# Patient Record
Sex: Female | Born: 1945
Health system: Southern US, Community
[De-identification: ages and names within clinical notes are randomized; demographics above are authoritative.]

## PROBLEM LIST (undated history)

## (undated) DIAGNOSIS — B019 Varicella without complication: Secondary | ICD-10-CM

## (undated) DIAGNOSIS — K5790 Diverticulosis of intestine, part unspecified, without perforation or abscess without bleeding: Secondary | ICD-10-CM

## (undated) DIAGNOSIS — D126 Benign neoplasm of colon, unspecified: Secondary | ICD-10-CM

## (undated) DIAGNOSIS — R519 Headache, unspecified: Secondary | ICD-10-CM

## (undated) DIAGNOSIS — R51 Headache: Secondary | ICD-10-CM

## (undated) DIAGNOSIS — Z9889 Other specified postprocedural states: Secondary | ICD-10-CM

## (undated) DIAGNOSIS — R112 Nausea with vomiting, unspecified: Secondary | ICD-10-CM

## (undated) DIAGNOSIS — K4190 Unilateral femoral hernia, without obstruction or gangrene, not specified as recurrent: Secondary | ICD-10-CM

## (undated) DIAGNOSIS — J302 Other seasonal allergic rhinitis: Secondary | ICD-10-CM

## (undated) DIAGNOSIS — M1990 Unspecified osteoarthritis, unspecified site: Secondary | ICD-10-CM

## (undated) HISTORY — DX: Diverticulosis of intestine, part unspecified, without perforation or abscess without bleeding: K57.90

## (undated) HISTORY — PX: EYE SURGERY: SHX253

## (undated) HISTORY — DX: Unilateral femoral hernia, without obstruction or gangrene, not specified as recurrent: K41.90

## (undated) HISTORY — DX: Benign neoplasm of colon, unspecified: D12.6

## (undated) HISTORY — PX: OTHER SURGICAL HISTORY: SHX169

## (undated) HISTORY — DX: Headache, unspecified: R51.9

## (undated) HISTORY — DX: Unspecified osteoarthritis, unspecified site: M19.90

## (undated) HISTORY — DX: Headache: R51

## (undated) HISTORY — DX: Varicella without complication: B01.9

## (undated) HISTORY — PX: COSMETIC SURGERY: SHX468

## (undated) HISTORY — PX: SHOULDER ARTHROSCOPY: SHX128

## (undated) HISTORY — PX: HERNIA REPAIR: SHX51

---

## 2006-10-31 ENCOUNTER — Ambulatory Visit: Payer: Self-pay | Admitting: Internal Medicine

## 2006-10-31 ENCOUNTER — Encounter: Payer: Self-pay | Admitting: Internal Medicine

## 2006-11-14 ENCOUNTER — Encounter: Payer: Self-pay | Admitting: Internal Medicine

## 2006-11-14 ENCOUNTER — Ambulatory Visit: Payer: Self-pay

## 2006-12-14 ENCOUNTER — Ambulatory Visit: Payer: Self-pay | Admitting: Internal Medicine

## 2006-12-27 ENCOUNTER — Ambulatory Visit: Payer: Self-pay | Admitting: Internal Medicine

## 2007-07-19 ENCOUNTER — Encounter: Payer: Self-pay | Admitting: Internal Medicine

## 2007-10-03 ENCOUNTER — Encounter: Payer: Self-pay | Admitting: Internal Medicine

## 2009-08-20 ENCOUNTER — Ambulatory Visit: Payer: Self-pay | Admitting: Internal Medicine

## 2010-03-11 ENCOUNTER — Ambulatory Visit
Admission: RE | Admit: 2010-03-11 | Discharge: 2010-03-11 | Payer: Self-pay | Source: Home / Self Care | Attending: Internal Medicine | Admitting: Internal Medicine

## 2010-03-17 NOTE — Assessment & Plan Note (Signed)
Summary: headaches//ccm   Vital Signs:  Patient profile:   65 year old female Temp:     98.3 degrees F oral Pulse rate:   64 / minute Pulse rhythm:   regular Resp:     12 per minute BP sitting:   96 / 60  (left arm) Cuff size:   regular  Vitals Entered By: Gladis Riffle, RN (August 20, 2009 9:41 AM) CC: c/o headaches--was on meloxicam and lyrica for arthritis in neck, but has no Rx and not sure what to do Is Patient Diabetic? No   CC:  c/o headaches--was on meloxicam and lyrica for arthritis in neck and but has no Rx and not sure what to do.  History of Present Illness: she reports a long hx of neck pain that she relates to headache headache ongoing for 3 weeks---daily, can be severe occasionally has associated ear pain advil and aleve provide temporary relief.  no neurologic deficit.  All other systems reviewed and were negative  she continues to be active and exercises regularly    Preventive Screening-Counseling & Management  Alcohol-Tobacco     Alcohol drinks/day: <1     Smoking Status: never  Current Problems (verified): 1)  Health Screening  (ICD-V70.0)  Current Medications (verified): 1)  Estradiol-Norethindrone Acet 1-0.5 Mg Tabs (Estradiol-Norethindrone Acet) .... One Every Other Day  Allergies (verified): No Known Drug Allergies  Past History:  Past Medical History: Last updated: 12/14/2006 Unremarkable GERD  Past Surgical History: Last updated: 2006/11/05 breast reduction  Family History: Last updated: 05-Nov-2006 mother deceased-Atrial Fibrillation decease 81 yo father lung CA age 55 Family History Lung cancer  Social History: Last updated: Nov 05, 2006 Occupation: Contractor Married Never Smoked (a little in college) Alcohol use-yes Regular exercise-yes  Risk Factors: Alcohol Use: <1 (08/20/2009) Exercise: yes (11/05/2006)  Risk Factors: Smoking Status: never (08/20/2009)  Physical Exam  General:  Well-developed,well-nourished,in no  acute distress; alert,appropriate and cooperative throughout examination Head:  normocephalic and atraumatic.   Eyes:  pupils equal and pupils round.   Ears:  R ear normal and L ear normal.   Neck:  No deformities, masses, or tenderness noted. Lungs:  Normal respiratory effort, chest expands symmetrically. Lungs are clear to auscultation, no crackles or wheezes. Heart:  Normal rate and regular rhythm. S1 and S2 normal without gallop, murmur, click, rub or other extra sounds. Pulses:  R radial normal and L radial normal.   temporal artery pulses normal Cervical Nodes:  No lymphadenopathy noted   Impression & Recommendations:  Problem # 1:  HEADACHE (ICD-784.0)  trial meloxicam once daily and flexeril side effects discussed  Orders: Venipuncture ([address]) Sedimentation Rate, non-automated (60454)  Her updated medication list for this problem includes:    Meloxicam 7.5 Mg Tabs (Meloxicam) ..... One by mouth daily with food  Complete Medication List: 1)  Estradiol-norethindrone Acet 1-0.5 Mg Tabs (Estradiol-norethindrone acet) .... One every other day 2)  Meloxicam 7.5 Mg Tabs (Meloxicam) .... One by mouth daily with food 3)  Cyclobenzaprine Hcl 10 Mg Tabs (Cyclobenzaprine hcl) .Marland Kitchen.. 1 by mouth 2 times daily as needed for neck pain  Patient Instructions: 1)  call in 10 days if not improving Prescriptions: CYCLOBENZAPRINE HCL 10 MG  TABS (CYCLOBENZAPRINE HCL) 1 by mouth 2 times daily as needed for neck pain  #20 x 0   Entered and Authorized by:   Birdie Sons MD   Signed by:   Birdie Sons MD on 08/20/2009   Method used:   [facility] to  [facility]  [place] [address]retail)       [address]       [place], [place]  [address]       Ph: [phone] or [phone]       Fax: [phone]   RxID:   [id number]   Laboratory Results   Blood Tests   Date/Time Recieved: August 20, 2009 10:57 AM  Date/Time Reported: August 20, 2009 10:57 AM   SED rate: 3  Comments: Wynona Canes, CMA  August 20, 2009 10:57 AM      Appended Document: headaches//ccm call patient, labs normal...no indication of "inflammatory disorder".  Appended Document: headaches//ccm patient is aware

## 2010-03-25 NOTE — Assessment & Plan Note (Signed)
Summary: sinus congestion/njr 230pm/njr   Vital Signs:  Patient profile:   65 year old female Weight:      129 pounds BMI:     22.22 Temp:     97.3 degrees F oral Pulse rate:   60 / minute Pulse rhythm:   regular BP sitting:   92 / 60  (left arm) Cuff size:   regular  Vitals Entered By: Kyung Rudd, CMA (March 11, 2010 2:20 PM) CC: pt c/o ??sinus inf   CC:  pt c/o ??sinus inf.  History of Present Illness: 3 eek hx of sxs started 3 weeks ago with typical cold has now had worsening sinus congestion---pressure no fever or chills hx of allergic rhinitis.  state she took a zpack at the beginning of this cold  Current Medications (verified): 1)  Estradiol-Norethindrone Acet 1-0.5 Mg Tabs (Estradiol-Norethindrone Acet) .... One Every Other Day 2)  Meloxicam 7.5 Mg  Tabs (Meloxicam) .... One By Mouth Daily With Food 3)  Cyclobenzaprine Hcl 10 Mg  Tabs (Cyclobenzaprine Hcl) .Marland Kitchen.. 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   Eyes:  pupils equal and pupils round.   Ears:  R ear normal and L ear normal.   Neck:  No deformities, masses, or tenderness noted. Lungs:  normal respiratory effort, no intercostal retractions, and no accessory muscle use.     Impression & Recommendations:  Problem # 1:   SINUSITIS- ACUTE-NOS (ICD-461.9) by hx given duration will treat with ABX side effects discussed Her updated medication list for this problem includes:    Doxycycline Hyclate 100 Mg Caps (Doxycycline hyclate) .Marland Kitchen... Take 1 tab twice a day  Complete Medication List: 1)  Estradiol-norethindrone Acet 1-0.5 Mg Tabs (Estradiol-norethindrone acet) .... One every other day 2)  Doxycycline Hyclate 100 Mg Caps (Doxycycline hyclate) .... Take 1 tab twice a day Prescriptions: DOXYCYCLINE HYCLATE 100 MG CAPS (DOXYCYCLINE HYCLATE) Take 1 tab twice a day  #20 x 0   Entered and Authorized by:   Birdie Sons MD   Signed by:   Birdie Sons MD on 03/11/2010   Method used:   [facility]  [place] [address]       [address]       [place], [place]  [address]       Ph: [phone] or [phone]       Fax: [phone]   RxID:   [id number]    Orders Added: 1)  Est. Patient Level III [42595]

## 2011-09-20 DIAGNOSIS — M25512 Pain in left shoulder: Secondary | ICD-10-CM | POA: Insufficient documentation

## 2012-09-19 ENCOUNTER — Ambulatory Visit (INDEPENDENT_AMBULATORY_CARE_PROVIDER_SITE_OTHER): Payer: BC Managed Care – PPO | Admitting: Family Medicine

## 2012-09-19 VITALS — BP 92/60 | Temp 97.6°F | Wt 125.0 lb

## 2012-09-19 DIAGNOSIS — K219 Gastro-esophageal reflux disease without esophagitis: Secondary | ICD-10-CM

## 2012-09-19 NOTE — Progress Notes (Signed)
Chief Complaint  Patient presents with  . Abdominal Pain    and nausea     HPI:  Acute visit for abd pain: -started about 2 weeks ago - epigastric mild pain, intermittent, always in the afternoon and night, mild - feels like indigestion, not nessicarily after meals, worse when lies down -some nausea yesterday when cleaning shrimp -has heartburn that she uses tums for -Denies: fevers, chills, unintentional weight loss, constipation, changes in bowels, vomiting, melena, hematochezia, diarrhea, foreign travel, abc, dysphagia -just had blood work:cbc, cmp, lipids all normal   ROS: See pertinent positives and negatives per HPI.  No past medical history on file.  No family history on file.  History   Social History  . Marital Status: Married    Spouse Name: N/A    Number of Children: N/A  . Years of Education: N/A   Social History Main Topics  . Smoking status: Not on file  . Smokeless tobacco: Not on file  . Alcohol Use: Not on file  . Drug Use: Not on file  . Sexually Active: Not on file   Other Topics Concern  . Not on file   Social History Narrative  . No narrative on file    Current outpatient prescriptions:fluticasone (FLONASE) 50 MCG/ACT nasal spray, , Disp: , Rfl:   EXAM:  Filed Vitals:   09/19/12 0802  BP: 92/60  Temp: 97.6 F (36.4 C)    Body mass index is 21.45 kg/(m^2).  GENERAL: vitals reviewed and listed above, alert, oriented, appears well hydrated and in no acute distress  HEENT: atraumatic, conjunttiva clear, no obvious abnormalities on inspection of external nose and ears  NECK: no obvious masses on inspection  LUNGS: clear to auscultation bilaterally, no wheezes, rales or rhonchi, good air movement  CV: HRRR, no peripheral edema  ABD: BS+, soft, NTTP, no rebound or guarding  MS: moves all extremities without noticeable abnormality  PSYCH: pleasant and cooperative, no obvious depression or anxiety  ASSESSMENT AND PLAN:  Discussed  the following assessment and plan:  GERD (gastroesophageal reflux disease)  -benign exam, mild symptoms that are likely a mild GERD - discussed other potential etiologies. Reviewed normal CBC, CMP, lipids from this month with gyn. Will do trial of dialy PPI, lifestyle changes with follow up in 4 weeks. -Patient advised to return or notify a doctor immediately if symptoms worsen or persist or new concerns arise.  Patient Instructions  Please take Prilosec 20mg  once daily.  Follow up in 1 month.   Diet for Gastroesophageal Reflux Disease, Adult Reflux (acid reflux) is when acid from your stomach flows up into the esophagus. When acid comes in contact with the esophagus, the acid causes irritation and soreness (inflammation) in the esophagus. When reflux happens often or so severely that it causes damage to the esophagus, it is called gastroesophageal reflux disease (GERD). Nutrition therapy can help ease the discomfort of GERD. FOODS OR DRINKS TO AVOID OR LIMIT  Smoking or chewing tobacco. Nicotine is one of the most potent stimulants to acid production in the gastrointestinal tract.  Caffeinated and decaffeinated coffee and black tea.  Regular or low-calorie carbonated beverages or energy drinks (caffeine-free carbonated beverages are allowed).   Strong spices, such as black pepper, white pepper, red pepper, cayenne, curry powder, and chili powder.  Peppermint or spearmint.  Chocolate.  High-fat foods, including meats and fried foods. Extra added fats including oils, butter, salad dressings, and nuts. Limit these to less than 8 tsp per day.  Fruits and vegetables if they are not tolerated, such as citrus fruits or tomatoes.  Alcohol.  Any food that seems to aggravate your condition. If you have questions regarding your diet, call your caregiver or a registered dietitian. OTHER THINGS THAT MAY HELP GERD INCLUDE:   Eating your meals slowly, in a relaxed setting.  Eating 5 to 6  small meals per day instead of 3 large meals.  Eliminating food for a period of time if it causes distress.  Not lying down until 3 hours after eating a meal.  Keeping the head of your bed raised 6 to 9 inches (15 to 23 cm) by using a foam wedge or blocks under the legs of the bed. Lying flat may make symptoms worse.  Being physically active. Weight loss may be helpful in reducing reflux in overweight or obese adults.  Wear loose fitting clothing EXAMPLE MEAL PLAN This meal plan is approximately 2,000 calories based on https://www.bernard.org/ meal planning guidelines. Breakfast   cup cooked oatmeal.  1 cup strawberries.  1 cup low-fat milk.  1 oz almonds. Snack  1 cup cucumber slices.  6 oz yogurt (made from low-fat or fat-free milk). Lunch  2 slice whole-wheat bread.  2 oz sliced [place].  2 tsp mayonnaise.  1 cup blueberries.  1 cup snap peas. Snack  6 whole-wheat crackers.  1 oz string cheese. Dinner   cup brown rice.  1 cup mixed veggies.  1 tsp olive oil.  3 oz grilled fish. Document Released: 02/01/2005 Document Revised: 04/26/2011 Document Reviewed: 12/18/2010 [facility] Patient Information 2014 [place], Lona Kettle, Dahlia Client R.

## 2012-09-19 NOTE — Patient Instructions (Signed)
Please take Prilosec 20mg  once daily.  Follow up in 1 month.   Diet for Gastroesophageal Reflux Disease, Adult Reflux (acid reflux) is when acid from your stomach flows up into the esophagus. When acid comes in contact with the esophagus, the acid causes irritation and soreness (inflammation) in the esophagus. When reflux happens often or so severely that it causes damage to the esophagus, it is called gastroesophageal reflux disease (GERD). Nutrition therapy can help ease the discomfort of GERD. FOODS OR DRINKS TO AVOID OR LIMIT  Smoking or chewing tobacco. Nicotine is one of the most potent stimulants to acid production in the gastrointestinal tract.  Caffeinated and decaffeinated coffee and black tea.  Regular or low-calorie carbonated beverages or energy drinks (caffeine-free carbonated beverages are allowed).   Strong spices, such as black pepper, white pepper, red pepper, cayenne, curry powder, and chili powder.  Peppermint or spearmint.  Chocolate.  High-fat foods, including meats and fried foods. Extra added fats including oils, butter, salad dressings, and nuts. Limit these to less than 8 tsp per day.  Fruits and vegetables if they are not tolerated, such as citrus fruits or tomatoes.  Alcohol.  Any food that seems to aggravate your condition. If you have questions regarding your diet, call your caregiver or a registered dietitian. OTHER THINGS THAT MAY HELP GERD INCLUDE:   Eating your meals slowly, in a relaxed setting.  Eating 5 to 6 small meals per day instead of 3 large meals.  Eliminating food for a period of time if it causes distress.  Not lying down until 3 hours after eating a meal.  Keeping the head of your bed raised 6 to 9 inches (15 to 23 cm) by using a foam wedge or blocks under the legs of the bed. Lying flat may make symptoms worse.  Being physically active. Weight loss may be helpful in reducing reflux in overweight or obese adults.  Wear loose  fitting clothing EXAMPLE MEAL PLAN This meal plan is approximately 2,000 calories based on https://www.bernard.org/ meal planning guidelines. Breakfast   cup cooked oatmeal.  1 cup strawberries.  1 cup low-fat milk.  1 oz almonds. Snack  1 cup cucumber slices.  6 oz yogurt (made from low-fat or fat-free milk). Lunch  2 slice whole-wheat bread.  2 oz sliced [place].  2 tsp mayonnaise.  1 cup blueberries.  1 cup snap peas. Snack  6 whole-wheat crackers.  1 oz string cheese. Dinner   cup brown rice.  1 cup mixed veggies.  1 tsp olive oil.  3 oz grilled fish. Document Released: 02/01/2005 Document Revised: 04/26/2011 Document Reviewed: 12/18/2010 [facility] Patient Information 2014 [place], [place].

## 2012-10-19 ENCOUNTER — Encounter: Payer: Self-pay | Admitting: Family Medicine

## 2012-10-19 ENCOUNTER — Ambulatory Visit (INDEPENDENT_AMBULATORY_CARE_PROVIDER_SITE_OTHER): Payer: BC Managed Care – PPO | Admitting: Family Medicine

## 2012-10-19 VITALS — BP 88/60 | Temp 97.7°F | Wt 128.0 lb

## 2012-10-19 DIAGNOSIS — H6983 Other specified disorders of Eustachian tube, bilateral: Secondary | ICD-10-CM

## 2012-10-19 DIAGNOSIS — H6980 Other specified disorders of Eustachian tube, unspecified ear: Secondary | ICD-10-CM

## 2012-10-19 DIAGNOSIS — K219 Gastro-esophageal reflux disease without esophagitis: Secondary | ICD-10-CM

## 2012-10-19 DIAGNOSIS — J309 Allergic rhinitis, unspecified: Secondary | ICD-10-CM

## 2012-10-19 NOTE — Progress Notes (Signed)
Chief Complaint  Patient presents with  . 1 month follow up    stomach pains better  . Otalgia    HPI:  Audrey Tucker, a 67 yo F patient of Dr. Cato Mulligan, is here for follow up for:  1)GERD: -advised lifestyle changes and PPI last visit ROS: See pertinent positives and negatives per HPI. -doing much better -denies: abd pain, nausea, vomiting, bowel changes, melena, weight loss  2)R ear pain -started last week -uses q tips -a little dried blood, had been swimming this past weekend -fullness, pressure in ears, nasal congestion clear, PND, sneezing - allergies bad -denies: fevers, chills, drainage from ears, hearing loss  No past medical history on file.  No past surgical history on file.  No family history on file.  History   Social History  . Marital Status: Married    Spouse Name: N/A    Number of Children: N/A  . Years of Education: N/A   Social History Main Topics  . Smoking status: Former Games developer  . Smokeless tobacco: None  . Alcohol Use: Yes  . Drug Use: None  . Sexual Activity: None   Other Topics Concern  . None   Social History Narrative  . None    Current outpatient prescriptions:fluticasone (FLONASE) 50 MCG/ACT nasal spray, , Disp: , Rfl: ;  MIMVEY 1-0.5 MG per tablet, Take 1 tablet by mouth daily. , Disp: , Rfl:   EXAM:  Filed Vitals:   10/19/12 0839  BP: 88/60  Temp: 97.7 F (36.5 C)    Body mass index is 21.96 kg/(m^2).  GENERAL: vitals reviewed and listed above, alert, oriented, appears well hydrated and in no acute distress  HEENT: atraumatic, conjunttiva clear, no obvious abnormalities on inspection of external nose and ears, normal appearance of ear canals and TMs other then clear effusions, clear nasal congestion, mild post oropharyngeal erythema with PND, no tonsillar edema or exudate, no sinus TTP  NECK: no obvious masses on inspection  LUNGS: clear to auscultation bilaterally, no wheezes, rales or rhonchi, good air movement  CV:  HRRR, no peripheral edema  MS: moves all extremities without noticeable abnormality  PSYCH: pleasant and cooperative, no obvious depression or anxiety  ASSESSMENT AND PLAN:  Discussed the following assessment and plan:  GERD (gastroesophageal reflux disease)  Allergic rhinitis  Eustachian tube dysfunction, bilateral  -Patient advised to return or notify a doctor immediately if symptoms worsen or persist or new concerns arise.  Patient Instructions  -continue prilosec for 1-2 months more then wean off  -continue reflux diet  -flonase 2 sprays each nostril for 1 month  -take allegra or zyrtec once daily  -follow up as needed if symptoms persist     Angello Chien R.

## 2012-10-19 NOTE — Patient Instructions (Signed)
-  continue prilosec for 1-2 months more then wean off  -continue reflux diet  -flonase 2 sprays each nostril for 1 month  -take allegra or zyrtec once daily  -follow up as needed if symptoms persist

## 2013-10-23 ENCOUNTER — Ambulatory Visit (INDEPENDENT_AMBULATORY_CARE_PROVIDER_SITE_OTHER): Payer: BC Managed Care – PPO | Admitting: Physician Assistant

## 2013-10-23 ENCOUNTER — Encounter: Payer: Self-pay | Admitting: Physician Assistant

## 2013-10-23 VITALS — BP 102/72 | HR 72 | Temp 98.3°F | Resp 18 | Wt 126.3 lb

## 2013-10-23 DIAGNOSIS — J309 Allergic rhinitis, unspecified: Secondary | ICD-10-CM

## 2013-10-23 DIAGNOSIS — H6983 Other specified disorders of Eustachian tube, bilateral: Secondary | ICD-10-CM

## 2013-10-23 DIAGNOSIS — H6980 Other specified disorders of Eustachian tube, unspecified ear: Secondary | ICD-10-CM

## 2013-10-23 NOTE — Progress Notes (Signed)
Subjective:    Patient ID: Audrey Tucker, female    DOB: 1945/10/04, 68 y.o.   MRN: [id number]  Sinusitis This is a new problem. The current episode started in the past 7 days (6 days). The problem has been gradually worsening since onset. There has been no fever. Her pain is at a severity of 8/10. The pain is severe. Associated symptoms include congestion, coughing (clearing cough, dry, hourly), ear pain (right ear is worse), headaches ("pressure headache"), a hoarse voice and a sore throat (more on right side of throat). Pertinent negatives include no chills, diaphoresis, neck pain, shortness of breath, sinus pressure, sneezing or swollen glands. Treatments tried: zyrtec, flonase. The treatment provided mild relief.      Review of Systems  Constitutional: Negative for fever, chills and diaphoresis.  HENT: Positive for congestion, ear pain (right ear is worse), hoarse voice, postnasal drip and sore throat (more on right side of throat). Negative for ear discharge, sinus pressure and sneezing.   Respiratory: Positive for cough (clearing cough, dry, hourly). Negative for shortness of breath and wheezing.   Cardiovascular: Negative for chest pain.  Gastrointestinal: Negative for nausea, vomiting and diarrhea.  Musculoskeletal: Negative for neck pain.  Neurological: Positive for headaches ("pressure headache"). Negative for syncope.  All other systems reviewed and are negative.  History reviewed. No pertinent past medical history.  History   Social History  . Marital Status: Married    Spouse Name: N/A    Number of Children: N/A  . Years of Education: N/A   Occupational History  . Not on file.   Social History Main Topics  . Smoking status: Former Research scientist (life sciences)  . Smokeless tobacco: Not on file  . Alcohol Use: Yes  . Drug Use: Not on file  . Sexual Activity: Not on file   Other Topics Concern  . Not on file   Social History Narrative  . No narrative on file    History reviewed.  No pertinent past surgical history.  No family history on file.  No Known Allergies  Current Outpatient Prescriptions on File Prior to Visit  Medication Sig Dispense Refill  . fluticasone (FLONASE) 50 MCG/ACT nasal spray       . MIMVEY 1-0.5 MG per tablet Take 1 tablet by mouth daily.        No current facility-administered medications on file prior to visit.    EXAM: BP 102/72  Pulse 72  Temp(Src) 98.3 F (36.8 C) (Oral)  Resp 18  Wt 126 lb 4.8 oz (57.289 kg)     Objective:   Physical Exam  Nursing note and vitals reviewed. Constitutional: She is oriented to person, place, and time. She appears well-developed and well-nourished. No distress.  HENT:  Head: Normocephalic and atraumatic.  Right Ear: External ear normal.  Left Ear: External ear normal.  Nose: Nose normal.  Mouth/Throat: No oropharyngeal exudate.  Oropharynx is slightly erythematous, no exudate. Bilateral TMs mildly retracted, otherwise normal. Bilateral frontal and maxillary sinuses non-TTP.  Eyes: Conjunctivae and EOM are normal. Pupils are equal, round, and reactive to light.  Neck: Normal range of motion. Neck supple.  Cardiovascular: Normal rate, regular rhythm and intact distal pulses.   Pulmonary/Chest: Effort normal and breath sounds normal. No stridor. No respiratory distress. She has no wheezes. She has no rales. She exhibits no tenderness.  Lymphadenopathy:    She has no cervical adenopathy.  Neurological: She is alert and oriented to person, place, and time.  Skin: Skin is warm and  dry. No rash noted. She is not diaphoretic. No erythema. No pallor.  Psychiatric: She has a normal mood and affect. Her behavior is normal. Judgment and thought content normal.     No results found for this basename: WBC, HGB, HCT, PLT, GLUCOSE, CHOL, TRIG, HDL, LDLDIRECT, LDLCALC, ALT, AST, NA, K, CL, CREATININE, BUN, CO2, TSH, PSA, INR, GLUF, HGBA1C, MICROALBUR        Assessment & Plan:  Tykerria was seen today  for sinusitis.  Diagnoses and associated orders for this visit:  Allergic rhinitis, unspecified allergic rhinitis type Comments: Continue symptom treatment with otc mucinex, antihistamine, nasal steroid, push fluids, rest, throat lozenge, watchful waiting.  Eustachian tube dysfunction, bilateral Comments: Continue nasal steroid, add sudafed and saline rinse. watchful waiting.    Return precautions provided, and patient handout on allergic rhinitis.  Plan to follow up as needed, or for worsening or persistent symptoms despite treatment.  Patient Instructions  Plain Over the Counter Mucinex for thick secretions  You can try Sudafed to help relieve your ear pain.  Throat lozenges and saltwater gargles for sore throat symptoms.  Force NON dairy fluids, drinking plenty of water is best.    Over the Counter Flonase OR Nasacort AQ 1 spray in each nostril twice a day as needed. Use the "crossover" technique into opposite nostril spraying toward opposite ear @ 45 degree angle, not straight up into nostril.   Plain Over the Counter Allegra (NOT D )  160 daily , OR Loratidine 10 mg , OR Zyrtec 10 mg @ bedtime  as needed for itchy eyes & sneezing.  Saline Irrigation and Saline Sprays can also help reduce symptoms.  If emergency symptoms discussed during visit developed, seek medical attention immediately.  Followup as needed, or for worsening or persistent symptoms despite treatment.

## 2013-10-23 NOTE — Progress Notes (Signed)
Pre visit review using our clinic review tool, if applicable. No additional management support is needed unless otherwise documented below in the visit note. 

## 2013-10-23 NOTE — Patient Instructions (Addendum)
Plain Over the Counter Mucinex for thick secretions  You can try Sudafed to help relieve your ear pain.  Throat lozenges and saltwater gargles for sore throat symptoms.  Force NON dairy fluids, drinking plenty of water is best.    Over the Counter Flonase OR Nasacort AQ 1 spray in each nostril twice a day as needed. Use the "crossover" technique into opposite nostril spraying toward opposite ear @ 45 degree angle, not straight up into nostril.   Plain Over the Counter Allegra (NOT D )  160 daily , OR Loratidine 10 mg , OR Zyrtec 10 mg @ bedtime  as needed for itchy eyes & sneezing.  Saline Irrigation and Saline Sprays can also help reduce symptoms.  If emergency symptoms discussed during visit developed, seek medical attention immediately.  Followup as needed, or for worsening or persistent symptoms despite treatment.      Allergic Rhinitis Allergic rhinitis is when the mucous membranes in the nose respond to allergens. Allergens are particles in the air that cause your body to have an allergic reaction. This causes you to release allergic antibodies. Through a chain of events, these eventually cause you to release histamine into the blood stream. Although meant to protect the body, it is this release of histamine that causes your discomfort, such as frequent sneezing, congestion, and an itchy, runny nose.  CAUSES  Seasonal allergic rhinitis (hay fever) is caused by pollen allergens that may come from grasses, trees, and weeds. Year-round allergic rhinitis (perennial allergic rhinitis) is caused by allergens such as house dust mites, pet dander, and mold spores.  SYMPTOMS   Nasal stuffiness (congestion).  Itchy, runny nose with sneezing and tearing of the eyes. DIAGNOSIS  Your health care provider can help you determine the allergen or allergens that trigger your symptoms. If you and your health care provider are unable to determine the allergen, skin or blood testing may be  used. TREATMENT  Allergic rhinitis does not have a cure, but it can be controlled by:  Medicines and allergy shots (immunotherapy).  Avoiding the allergen. Hay fever may often be treated with antihistamines in pill or nasal spray forms. Antihistamines block the effects of histamine. There are over-the-counter medicines that may help with nasal congestion and swelling around the eyes. Check with your health care provider before taking or giving this medicine.  If avoiding the allergen or the medicine prescribed do not work, there are many new medicines your health care provider can prescribe. Stronger medicine may be used if initial measures are ineffective. Desensitizing injections can be used if medicine and avoidance does not work. Desensitization is when a patient is given ongoing shots until the body becomes less sensitive to the allergen. Make sure you follow up with your health care provider if problems continue. HOME CARE INSTRUCTIONS It is not possible to completely avoid allergens, but you can reduce your symptoms by taking steps to limit your exposure to them. It helps to know exactly what you are allergic to so that you can avoid your specific triggers. SEEK MEDICAL CARE IF:   You have a fever.  You develop a cough that does not stop easily (persistent).  You have shortness of breath.  You start wheezing.  Symptoms interfere with normal daily activities. Document Released: 10/27/2000 Document Revised: 02/06/2013 Document Reviewed: 10/09/2012 [facility] Patient Information 2015 [place], [place]. This information is not intended to replace advice given to you by your health care provider. Make sure you discuss any questions you have with  your health care provider.

## 2013-11-22 ENCOUNTER — Other Ambulatory Visit: Payer: Self-pay | Admitting: Otolaryngology

## 2013-11-22 DIAGNOSIS — H903 Sensorineural hearing loss, bilateral: Secondary | ICD-10-CM

## 2013-11-22 DIAGNOSIS — G43909 Migraine, unspecified, not intractable, without status migrainosus: Secondary | ICD-10-CM

## 2013-11-24 ENCOUNTER — Ambulatory Visit
Admission: RE | Admit: 2013-11-24 | Discharge: 2013-11-24 | Disposition: A | Discharge status: Home / Self Care | Payer: BC Managed Care – PPO | Source: Ambulatory Visit | Attending: Otolaryngology | Admitting: Otolaryngology

## 2013-11-24 DIAGNOSIS — G43909 Migraine, unspecified, not intractable, without status migrainosus: Secondary | ICD-10-CM

## 2013-11-24 DIAGNOSIS — H903 Sensorineural hearing loss, bilateral: Secondary | ICD-10-CM

## 2013-11-24 MED ORDER — GADOBENATE DIMEGLUMINE 529 MG/ML IV SOLN
10.0000 mL | Freq: Once | INTRAVENOUS | Status: AC | PRN
  Administered 2013-11-24: 10 mL via INTRAVENOUS

## 2014-06-28 ENCOUNTER — Telehealth: Payer: Self-pay | Admitting: *Deleted

## 2014-06-28 NOTE — Telephone Encounter (Signed)
Left message for pt to call back.  Need to know if she got mammogram this year

## 2015-01-13 ENCOUNTER — Ambulatory Visit (INDEPENDENT_AMBULATORY_CARE_PROVIDER_SITE_OTHER): Payer: BLUE CROSS/BLUE SHIELD

## 2015-01-13 ENCOUNTER — Encounter: Payer: Self-pay | Admitting: Podiatry

## 2015-01-13 ENCOUNTER — Ambulatory Visit (INDEPENDENT_AMBULATORY_CARE_PROVIDER_SITE_OTHER): Payer: BLUE CROSS/BLUE SHIELD | Admitting: Podiatry

## 2015-01-13 VITALS — BP 107/64 | HR 86 | Resp 12

## 2015-01-13 DIAGNOSIS — M2040 Other hammer toe(s) (acquired), unspecified foot: Secondary | ICD-10-CM

## 2015-01-13 DIAGNOSIS — R52 Pain, unspecified: Secondary | ICD-10-CM

## 2015-01-13 DIAGNOSIS — M779 Enthesopathy, unspecified: Secondary | ICD-10-CM

## 2015-01-13 MED ORDER — TRIAMCINOLONE ACETONIDE 10 MG/ML IJ SUSP
10.0000 mg | Freq: Once | INTRAMUSCULAR | Status: AC
  Administered 2015-01-13: 10 mg

## 2015-01-13 NOTE — Progress Notes (Signed)
   Subjective:    Patient ID: Audrey Tucker, female    DOB: Nov 12, 1945, 69 y.o.   MRN: [id number]  HPI  ''RT FOOT UNDERNEATH THE TOE IS BURNING/PAIN FOR 2 MONTHS. THE FOOT IS GETTING WORSE WHEN WALKING AND TRIED NO TREATMENT.  Review of Systems  All other systems reviewed and are negative.      Objective:   Physical Exam        Assessment & Plan:

## 2015-01-15 NOTE — Progress Notes (Signed)
Subjective:     Patient ID: Audrey Tucker, female   DOB: [birth year], 69 y.o.   MRN: [id number]  HPI patient states I been getting a lot of pain under my right foot that's been present for several months. I do not remember a specific injury which may have occurred   Review of Systems  All other systems reviewed and are negative.      Objective:   Physical Exam  Constitutional: She is oriented to person, place, and time.  Cardiovascular: Intact distal pulses.   Musculoskeletal: Normal range of motion.  Neurological: She is oriented to person, place, and time.  Skin: Skin is warm.  Nursing note and vitals reviewed.  neurovascular status intact muscle strength adequate range of motion was found to be in normal limits. Patient's noted to have edema and swelling around the second MPJ right with mild movement of the toe and is also noted to have mild structural imbalances. Patient has good digital perfusion is well oriented 3     Assessment:     Inflammatory capsulitis second MPJ right with possibility for flexor plate stretch or tear    Plan:     H&P and conditions reviewed with patient. Today I went ahead and I educated her on this and did a proximal block explaining the risk of injection of the joint. Patient wants procedure I aspirated the second MPJ getting out of small amount of clear fluid and injected with a quarter cc of dexamethasone Kenalog and applied thick pad diffuse and remove pressure from the joint surface. Reappoint to recheck again in the next several weeks

## 2015-01-27 ENCOUNTER — Encounter: Payer: Self-pay | Admitting: Podiatry

## 2015-01-27 ENCOUNTER — Ambulatory Visit (INDEPENDENT_AMBULATORY_CARE_PROVIDER_SITE_OTHER): Payer: BLUE CROSS/BLUE SHIELD | Admitting: Podiatry

## 2015-01-27 VITALS — BP 114/61 | HR 75 | Resp 16

## 2015-01-27 DIAGNOSIS — M779 Enthesopathy, unspecified: Secondary | ICD-10-CM

## 2015-01-27 DIAGNOSIS — G5761 Lesion of plantar nerve, right lower limb: Secondary | ICD-10-CM

## 2015-01-27 DIAGNOSIS — M2040 Other hammer toe(s) (acquired), unspecified foot: Secondary | ICD-10-CM

## 2015-01-27 DIAGNOSIS — G5781 Other specified mononeuropathies of right lower limb: Secondary | ICD-10-CM

## 2015-01-30 NOTE — Progress Notes (Signed)
Subjective:     Patient ID: Audrey Tucker, female   DOB: April 27, 1945, 69 y.o.   MRN: [id number]  HPI patient states it still bothering me but it seems to be in a different place in the area you worked on is feeling better   Review of Systems     Objective:   Physical Exam Neurovascular status intact no other health changes with inflammation second MPJ right which has improved with quite a bit of discomfort now occurring in the third interspace with radiating discomfort into the adjacent digits    Assessment:     Strong possibility for neuroma symptomatology along with inflammatory condition which appears to have reduced with medication    Plan:     Reviewed both conditions and the difficulty between distinguishing them. Today I went ahead did a sterile prep of the right forefoot and injected directly into the nerve of the third interspace with a purified alcohol Marcaine solution which was tolerated well 1.3 mL and reevaluate in 2 weeks

## 2015-02-13 ENCOUNTER — Ambulatory Visit: Payer: BLUE CROSS/BLUE SHIELD | Admitting: Podiatry

## 2015-02-24 ENCOUNTER — Ambulatory Visit (INDEPENDENT_AMBULATORY_CARE_PROVIDER_SITE_OTHER): Payer: BLUE CROSS/BLUE SHIELD | Admitting: Podiatry

## 2015-02-24 ENCOUNTER — Encounter: Payer: Self-pay | Admitting: Podiatry

## 2015-02-24 ENCOUNTER — Ambulatory Visit: Payer: BLUE CROSS/BLUE SHIELD | Admitting: Podiatry

## 2015-02-24 VITALS — BP 94/58 | HR 80 | Resp 16

## 2015-02-24 DIAGNOSIS — G5761 Lesion of plantar nerve, right lower limb: Secondary | ICD-10-CM

## 2015-02-24 DIAGNOSIS — G5781 Other specified mononeuropathies of right lower limb: Secondary | ICD-10-CM

## 2015-02-24 NOTE — Progress Notes (Signed)
Subjective:     Patient ID: Audrey Tucker, female   DOB: Jan 06, 1946, 70 y.o.   MRN: [id number]  HPI patient presents stating I had significant reduction of discomfort for the first 5 hours followed by reoccurrence   Review of Systems     Objective:   Physical Exam  neurovascular status intact muscle strength adequate with significant discomfort still third interspace right with a positive Biagio Borg sign and a palpable mass    Assessment:      neuroma symptomatology present right    Plan:      reviewed with patient and today I reinjected the nerve with a purified alcohol Marcaine solution 1.3 mL that was tolerated well

## 2015-03-10 ENCOUNTER — Ambulatory Visit (INDEPENDENT_AMBULATORY_CARE_PROVIDER_SITE_OTHER): Payer: BLUE CROSS/BLUE SHIELD | Admitting: Podiatry

## 2015-03-10 ENCOUNTER — Encounter: Payer: Self-pay | Admitting: Podiatry

## 2015-03-10 VITALS — BP 109/64 | HR 73 | Resp 16

## 2015-03-10 DIAGNOSIS — G5761 Lesion of plantar nerve, right lower limb: Secondary | ICD-10-CM

## 2015-03-10 DIAGNOSIS — G5781 Other specified mononeuropathies of right lower limb: Secondary | ICD-10-CM

## 2015-03-10 NOTE — Patient Instructions (Signed)
Pre-Operative Instructions  Congratulations, you have decided to take an important step to improving your quality of life.  You can be assured that the doctors of [facility] will be with you every step of the way.  1. Plan to be at the surgery center/hospital at least 1 (one) hour prior to your scheduled time unless otherwise directed by the surgical center/hospital staff.  You must have a responsible adult accompany you, remain during the surgery and drive you home.  Make sure you have directions to the surgical center/hospital and know how to get there on time. 2. For hospital based surgery you will need to obtain a history and physical form from your family physician within 1 month prior to the date of surgery- we will give you a form for you primary physician.  3. We make every effort to accommodate the date you request for surgery.  There are however, times where surgery dates or times have to be moved.  We will contact you as soon as possible if a change in schedule is required.   4. No Aspirin/Ibuprofen for one week before surgery.  If you are on aspirin, any non-steroidal anti-inflammatory medications (Mobic, Aleve, Ibuprofen) you should stop taking it 7 days prior to your surgery.  You make take Tylenol  For pain prior to surgery.  5. Medications- If you are taking daily heart and blood pressure medications, seizure, reflux, allergy, asthma, anxiety, pain or diabetes medications, make sure the surgery center/hospital is aware before the day of surgery so they may notify you which medications to take or avoid the day of surgery. 6. No food or drink after midnight the night before surgery unless directed otherwise by surgical center/hospital staff. 7. No alcoholic beverages 24 hours prior to surgery.  No smoking 24 hours prior to or 24 hours after surgery. 8. Wear loose pants or shorts- loose enough to fit over bandages, boots, and casts. 9. No slip on shoes, sneakers are best. 10. Bring  your boot with you to the surgery center/hospital.  Also bring crutches or a walker if your physician has prescribed it for you.  If you do not have this equipment, it will be provided for you after surgery. 11. If you have not been contracted by the surgery center/hospital by the day before your surgery, call to confirm the date and time of your surgery. 12. Leave-time from work may vary depending on the type of surgery you have.  Appropriate arrangements should be made prior to surgery with your employer. 13. Prescriptions will be provided immediately following surgery by your doctor.  Have these filled as soon as possible after surgery and take the medication as directed. 14. Remove nail polish on the operative foot. 15. Wash the night before surgery.  The night before surgery wash the foot and leg well with the antibacterial soap provided and water paying special attention to beneath the toenails and in between the toes.  Rinse thoroughly with water and dry well with a towel.  Perform this wash unless told not to do so by your physician.  Enclosed: 1 Ice pack (please put in freezer the night before surgery)   1 Hibiclens skin cleaner   Pre-op Instructions  If you have any questions regarding the instructions, do not hesitate to call our office.  [place]: [address] [place], [place] [address] [phone]  [place]: [address] [place], [place] [address] [phone]  [place]: [address]  [place], [place] [address] [phone]  Dr. Richard   Tuchman DPM, Dr. Norman Regal DPM Dr. Richard Sikora DPM, Dr. M. Todd Hyatt DPM, Dr. Kathryn Egerton DPM 

## 2015-03-11 NOTE — Progress Notes (Signed)
Subjective:     Patient ID: Audrey Tucker, female   DOB: [birth year], 70 y.o.   MRN: [id number]  HPI patient presents stating this area is still killing me and I know on getting need to do something with it   Review of Systems     Objective:   Physical Exam  neurovascular status intact with shooting radiating discomfort third interspace right which is failed to respond so far to neuro lysis treatment    Assessment:      continue neuroma symptomatology present right    Plan:      reviewed condition and explained this is a clinical diagnosis based on where it hurts and the type of pain she is experiencing. Patient wants to have this fixed and at this time I allowed her to read consent form reviewing alternative treatments complications and fact there is no long-term guarantees this will improve the condition. She wants surgery and signs consent form understanding recovery can take upwards of 6 months and she is scheduled for outpatient surgery in the next several months after she returns from ski trip

## 2015-03-12 ENCOUNTER — Telehealth: Payer: Self-pay | Admitting: *Deleted

## 2015-03-12 NOTE — Telephone Encounter (Signed)
"  I'm a patient of Dr. Paulla Dolly.  I'm scheduled for surgery on 04/29/2015.  I've misplaced my instructions on how to go on-line to register."  Do you have your brochure from the surgical center that was in the blue bag?  Instructions are in the brochure on the second page.  "Okay, I didn't think to look in there.  Thank you."

## 2015-04-29 DIAGNOSIS — G5760 Lesion of plantar nerve, unspecified lower limb: Secondary | ICD-10-CM

## 2015-04-29 HISTORY — PX: FOOT NEUROMA SURGERY: SHX646

## 2015-05-09 ENCOUNTER — Ambulatory Visit (INDEPENDENT_AMBULATORY_CARE_PROVIDER_SITE_OTHER): Payer: BLUE CROSS/BLUE SHIELD | Admitting: Podiatry

## 2015-05-09 DIAGNOSIS — Z9889 Other specified postprocedural states: Secondary | ICD-10-CM | POA: Diagnosis not present

## 2015-05-09 DIAGNOSIS — G5761 Lesion of plantar nerve, right lower limb: Secondary | ICD-10-CM | POA: Diagnosis not present

## 2015-05-09 DIAGNOSIS — G5781 Other specified mononeuropathies of right lower limb: Secondary | ICD-10-CM

## 2015-05-11 NOTE — Progress Notes (Signed)
Subjective:     Patient ID: Audrey Tucker, female   DOB: [birth year], 70 y.o.   MRN: [id number]  HPI patient states I'm doing well with my right foot   Review of Systems     Objective:   Physical Exam Neurovascular status intact negative Homans sign noted wound edges well coapted with numbness between the third and fourth toes right and mild bruising localized in nature    Assessment:     Doing well post neurectomy third interspace right    Plan:     Applied sterile dressing instructed on continued elevation and dispensed a anklet and compression sock and instructed on gradual increase in activity over the next few weeks. Reappoint to recheck in the next month or 2 or earlier if needed

## 2015-05-16 ENCOUNTER — Encounter (HOSPITAL_BASED_OUTPATIENT_CLINIC_OR_DEPARTMENT_OTHER): Payer: Self-pay | Admitting: *Deleted

## 2015-05-16 DIAGNOSIS — S66121A Laceration of flexor muscle, fascia and tendon of left index finger at wrist and hand level, initial encounter: Secondary | ICD-10-CM | POA: Insufficient documentation

## 2015-05-19 ENCOUNTER — Other Ambulatory Visit: Payer: Self-pay | Admitting: Orthopedic Surgery

## 2015-05-22 ENCOUNTER — Ambulatory Visit (HOSPITAL_BASED_OUTPATIENT_CLINIC_OR_DEPARTMENT_OTHER)
Admission: RE | Admit: 2015-05-22 | Payer: BLUE CROSS/BLUE SHIELD | Source: Ambulatory Visit | Admitting: Orthopedic Surgery

## 2015-05-22 HISTORY — DX: Other specified postprocedural states: Z98.890

## 2015-05-22 HISTORY — DX: Other specified postprocedural states: R11.2

## 2015-05-22 HISTORY — DX: Nausea with vomiting, unspecified: Z98.890

## 2015-05-22 HISTORY — DX: Other seasonal allergic rhinitis: J30.2

## 2015-05-22 SURGERY — REPAIR, TENDON, FLEXOR
Anesthesia: Choice | Laterality: Left

## 2015-06-03 ENCOUNTER — Encounter: Payer: Self-pay | Admitting: Podiatry

## 2015-10-10 ENCOUNTER — Encounter: Payer: Self-pay | Admitting: *Deleted

## 2015-10-10 NOTE — Progress Notes (Signed)
   DOS 04-29-15  Neurectomy 3rd interspace right

## 2016-01-29 ENCOUNTER — Encounter (INDEPENDENT_AMBULATORY_CARE_PROVIDER_SITE_OTHER): Payer: Self-pay

## 2016-01-29 ENCOUNTER — Ambulatory Visit (INDEPENDENT_AMBULATORY_CARE_PROVIDER_SITE_OTHER): Payer: BLUE CROSS/BLUE SHIELD

## 2016-01-29 ENCOUNTER — Ambulatory Visit (INDEPENDENT_AMBULATORY_CARE_PROVIDER_SITE_OTHER): Payer: BLUE CROSS/BLUE SHIELD | Admitting: Orthopaedic Surgery

## 2016-01-29 DIAGNOSIS — M7062 Trochanteric bursitis, left hip: Secondary | ICD-10-CM | POA: Diagnosis not present

## 2016-01-29 DIAGNOSIS — M7061 Trochanteric bursitis, right hip: Secondary | ICD-10-CM

## 2016-01-29 DIAGNOSIS — M25552 Pain in left hip: Secondary | ICD-10-CM | POA: Diagnosis not present

## 2016-01-29 MED ORDER — LIDOCAINE HCL 1 % IJ SOLN
3.0000 mL | INTRAMUSCULAR | Status: AC | PRN
  Administered 2016-01-29: 3 mL

## 2016-01-29 MED ORDER — METHOCARBAMOL 500 MG PO TABS: 500.0000 mg | ORAL_TABLET | Freq: Three times a day (TID) | ORAL | 0 refills | Status: DC | PRN

## 2016-01-29 MED ORDER — METHYLPREDNISOLONE ACETATE 40 MG/ML IJ SUSP
40.0000 mg | INTRAMUSCULAR | Status: AC | PRN
  Administered 2016-01-29: 40 mg via INTRA_ARTICULAR

## 2016-01-29 NOTE — Progress Notes (Signed)
Office Visit Note   Patient: Audrey Tucker           Date of Birth: 08/29/1945           MRN: [id number] Visit Date: 01/29/2016              Requested by: Luellen Pucker, MD [address] [place], [place] [address] PCP: Luellen Pucker, MD   Assessment & Plan: Visit Diagnoses:  1. Left hip pain     Plan: I did send and some Robaxin for her to take as needed. I injected both trochanteric areas without difficulty. She'll try activity modification and will follow up as needed.  Follow-Up Instructions: Return if symptoms worsen or fail to improve.   Orders:  Orders Placed This Encounter  Procedures  . XR HIP UNILAT W OR W/O PELVIS 1V LEFT   Meds ordered this encounter  Medications  . methocarbamol (ROBAXIN) 500 MG tablet    Sig: Take 1 tablet (500 mg total) by mouth 3 (three) times daily as needed for muscle spasms.    Dispense:  60 tablet    Refill:  0      Procedures: Large Joint Inj Date/Time: 01/29/2016 3:40 PM Performed by: Mcarthur Rossetti Authorized by: Jean Rosenthal Y   Location:  Hip Site:  R greater trochanter Ultrasound Guidance: No   Fluoroscopic Guidance: No   Arthrogram: No   Medications:  3 mL lidocaine 1 %; 40 mg methylPREDNISolone acetate 40 MG/ML Large Joint Inj Date/Time: 01/29/2016 3:41 PM Performed by: Mcarthur Rossetti Authorized by: Mcarthur Rossetti   Indications:  Pain Location:  Hip Site:  L greater trochanter Ultrasound Guidance: No   Fluoroscopic Guidance: No   Arthrogram: No   Medications:  3 mL lidocaine 1 %; 40 mg methylPREDNISolone acetate 40 MG/ML     Clinical Data: No additional findings.   Subjective: Chief Complaint  Patient presents with  . Right Hip - Pain    Patient states she has been having some pain in the left hip that radiates some to the knee. States both hips are painful but left is worse.  . Left Hip - Pain   She points the trochanteric area of both hips as  source of her pain. She denies any groin pain at all. This been slowly getting worse for her the left worse than right. Hurts her mainly at night when she lays on it and some with activities. She's had some methocarbamol that actually helps on occasion. She's had steroid injection before 2016 that helped. HPI Review of Systems She denies any chest pain, shortness breath, fever, chills, nausea, vomiting.  Objective: Vital Signs: There were no vitals taken for this visit.  Physical Exam  Is alert and oriented 3 in no acute distress Ortho Exam Examination both hips show normal hip exam with good excellent range of motion of both hips. She does have pain over trochanteric area on both sides. Her left actually has a snapping sensation as the IT band moves over the greater trochanter. Specialty Comments:  No specialty comments available.  Imaging: No results found.   PMFS History: There are no active problems to display for this patient.  Past Medical History:  Diagnosis Date  . PONV (postoperative nausea and vomiting)   . Seasonal allergies     No family history on file.  Past Surgical History:  Procedure Laterality Date  . FOOT NEUROMA SURGERY Right 04-29-15   Dr Paulla Dolly  .  SHOULDER ARTHROSCOPY Left    Social History   Occupational History  . Not on file.   Social History Main Topics  . Smoking status: Former Research scientist (life sciences)  . Smokeless tobacco: Not on file  . Alcohol use Yes     Comment: social  . Drug use: No  . Sexual activity: Not on file

## 2016-03-08 ENCOUNTER — Other Ambulatory Visit (INDEPENDENT_AMBULATORY_CARE_PROVIDER_SITE_OTHER): Payer: Self-pay | Admitting: Orthopaedic Surgery

## 2016-03-08 NOTE — Telephone Encounter (Signed)
please advise.

## 2016-05-19 DIAGNOSIS — R51 Headache: Secondary | ICD-10-CM | POA: Diagnosis not present

## 2016-06-09 ENCOUNTER — Other Ambulatory Visit: Payer: Self-pay | Admitting: Neurological Surgery

## 2016-06-09 DIAGNOSIS — G4489 Other headache syndrome: Secondary | ICD-10-CM

## 2016-06-09 DIAGNOSIS — R51 Headache: Secondary | ICD-10-CM | POA: Diagnosis not present

## 2016-06-10 DIAGNOSIS — R51 Headache: Secondary | ICD-10-CM | POA: Diagnosis not present

## 2016-06-23 ENCOUNTER — Other Ambulatory Visit: Payer: Medicare Other

## 2016-06-29 ENCOUNTER — Encounter: Payer: Self-pay | Admitting: Family Medicine

## 2016-06-29 ENCOUNTER — Other Ambulatory Visit: Payer: Self-pay | Admitting: Family Medicine

## 2016-06-29 ENCOUNTER — Ambulatory Visit (INDEPENDENT_AMBULATORY_CARE_PROVIDER_SITE_OTHER): Payer: PPO | Admitting: Family Medicine

## 2016-06-29 VITALS — BP 100/64 | HR 75 | Resp 12 | Ht 65.0 in | Wt 130.5 lb

## 2016-06-29 DIAGNOSIS — W57XXXA Bitten or stung by nonvenomous insect and other nonvenomous arthropods, initial encounter: Secondary | ICD-10-CM | POA: Diagnosis not present

## 2016-06-29 DIAGNOSIS — S70361A Insect bite (nonvenomous), right thigh, initial encounter: Secondary | ICD-10-CM

## 2016-06-29 DIAGNOSIS — R5383 Other fatigue: Secondary | ICD-10-CM | POA: Diagnosis not present

## 2016-06-29 DIAGNOSIS — G44209 Tension-type headache, unspecified, not intractable: Secondary | ICD-10-CM

## 2016-06-29 LAB — BASIC METABOLIC PANEL
BUN: 21 mg/dL (ref 6–23)
CHLORIDE: 107 meq/L (ref 96–112)
CO2: 26 meq/L (ref 19–32)
CREATININE: 0.65 mg/dL (ref 0.40–1.20)
Calcium: 9.1 mg/dL (ref 8.4–10.5)
GFR: 95.59 mL/min (ref >60.00)
GLUCOSE: 83 mg/dL (ref 70–99)
POTASSIUM: 4.1 meq/L (ref 3.5–5.1)
Sodium: 139 mEq/L (ref 135–145)

## 2016-06-29 LAB — SEDIMENTATION RATE: Sed Rate: 1 mm/hr (ref 0–30)

## 2016-06-29 LAB — C-REACTIVE PROTEIN: CRP: 0.1 mg/dL — ABNORMAL LOW (ref 0.5–20.0)

## 2016-06-29 LAB — TSH: TSH: 2.47 u[IU]/mL (ref 0.35–4.50)

## 2016-06-29 MED ORDER — NORTRIPTYLINE HCL 10 MG PO CAPS: 10.0000 mg | ORAL_CAPSULE | Freq: Every day | ORAL | 1 refills | Status: DC

## 2016-06-29 NOTE — Progress Notes (Addendum)
HPI:   [name] is a 71 y.o. female, who is here today to establish care.  Former PCP: Dr Leanne Chang  Last preventive routine visit: 09/2015. She follows with gyn regularly, Dr Kris Mouton  Chronic medical problems: Allergic rhinitis, GERD,OA, insomnia ("never" has slept well)   Lab work done on 09/16/15:  TSH 2.1 GLU 87 CBC wnl. CMP wnl  Component Name Value   Triglycerides 94   Cholesterol 183   HDL 73   LDL Calculated 91    Concerns today:   -Headaches:  About 6 weeks ago she started with new onset headache, preceded by visual aura. For about an hour she felt like she was looking through water and could not remember how asparagus look like while she was at the grocery store. After visual abnormality resolved she started with fronto-parietal and bitemporal headache. No associated photophobia, phonophobia, nausea,or vomiting.  No prior Hx of migraines. She was recently evaluated by neurologists and brain MRI was done, 05/2016, negative. Dx with migraine variant.  She is having daily headaches , not as bad as the one she had 6 weeks ago, dull, "nagging",constant pain, 4-5/10. Alleviated by Aleve but if she does not take any analgesic she will have headache all day. She has not identified exacerbating factors.  She denies Hx of anxiety. She is on Lorazepam, which has been prescribed by her gyn for RLS.  Hx of vertigo (exacerbated by lying down), tinnitus, and hearing loss. All these stable.  -Tick bite:  About 12 days ago she found a deer tick on inner right thigh. Since then she has felt fatigue, she had a few days of diarrhea and sore throat, resolved. She is not sure if these symptoms are related to tick bite, she is concerned about Lyme disease. She denies rash or worsening arthralgias.  Tick was engorged, imbedded, and thinks it was on her for about 24 hours.  -Concerned about possible thyroid disease causing fatigue , denies prior Hx. She states that  she usually doe snot sleep well but it is not new, unchanged. She denies Hx of anxiety or depression. She is not aware of sleep apnea.   Review of Systems  Constitutional: Positive for fatigue. Negative for activity change, appetite change, fever and unexpected weight change.  HENT: Positive for hearing loss (chronic,stable.) and tinnitus (bilateral, chronic). Negative for facial swelling, mouth sores, nosebleeds, sinus pain, sore throat and trouble swallowing.   Eyes: Negative for photophobia, redness and visual disturbance.  Respiratory: Negative for cough, shortness of breath and wheezing.   Cardiovascular: Negative for chest pain, palpitations and leg swelling.  Gastrointestinal: Negative for abdominal pain, blood in stool, nausea and vomiting.       Negative for changes in bowel habits.  Endocrine: Negative for cold intolerance, heat intolerance, polydipsia, polyphagia and polyuria.  Genitourinary: Negative for decreased urine volume, dysuria and hematuria.  Musculoskeletal: Positive for arthralgias. Negative for gait problem and myalgias.  Skin: Negative for rash and wound.  Allergic/Immunologic: Positive for environmental allergies.  Neurological: Positive for dizziness and headaches. Negative for seizures, syncope, weakness and numbness.  Hematological: Negative for adenopathy. Does not bruise/bleed easily.  Psychiatric/Behavioral: Positive for sleep disturbance. Negative for confusion. The patient is nervous/anxious.      Current Outpatient Prescriptions on File Prior to Visit  Medication Sig Dispense Refill  . methocarbamol (ROBAXIN) 500 MG tablet TAKE 1 TABLET 3 TIMES DAILY AS NEEDED FOR MUSCLE SPASMS 60 tablet 0  . MIMVEY 1-0.5 MG  per tablet Take 1 tablet by mouth every other day.      No current facility-administered medications on file prior to visit.      Past Medical History:  Diagnosis Date  . Arthritis   . Chicken pox   . Frequent headaches   . PONV  (postoperative nausea and vomiting)   . Seasonal allergies    No Known Allergies  Family History  Problem Relation Age of Onset  . Arthritis Mother   . Hypertension Mother   . Arthritis Father   . Cancer Father        lung    Social History   Social History  . Marital status: Married    Spouse name: N/A  . Number of children: N/A  . Years of education: N/A   Social History Main Topics  . Smoking status: Former Research scientist (life sciences)  . Smokeless tobacco: Never Used  . Alcohol use Yes     Comment: social  . Drug use: No  . Sexual activity: Not Asked   Other Topics Concern  . None   Social History Narrative  . None    Vitals:   06/29/16 1352  BP: 100/64  Pulse: 75  Resp: 12   O2 sat at RA 98% Body mass index is 21.72 kg/m.   Physical Exam  Nursing note and vitals reviewed. Constitutional: She is oriented to person, place, and time. She appears well-developed and well-nourished. No distress.  HENT:  Head: Atraumatic.  Nose: Right sinus exhibits no maxillary sinus tenderness and no frontal sinus tenderness. Left sinus exhibits no maxillary sinus tenderness and no frontal sinus tenderness.  Mouth/Throat: Oropharynx is clear and moist and mucous membranes are normal.  Eyes: Conjunctivae and EOM are normal. Pupils are equal, round, and reactive to light.  Neck: No tracheal deviation present. No thyroid mass and no thyromegaly present.  Cardiovascular: Normal rate and regular rhythm.   No murmur heard. Pulses:      Dorsalis pedis pulses are 2+ on the right side, and 2+ on the left side.  Respiratory: Effort normal and breath sounds normal. No respiratory distress.  GI: Soft. She exhibits no mass. There is no hepatomegaly. There is no tenderness.  Musculoskeletal: She exhibits no edema.       Cervical back: She exhibits normal range of motion and no tenderness.  Trapezium muscle spasm, R>L. No scalp tenderness.  Lymphadenopathy:    She has no cervical adenopathy.    Neurological: She is alert and oriented to person, place, and time. She has normal strength. No cranial nerve deficit. Gait normal.  Skin: Skin is warm. No erythema.  Psychiatric: Her mood appears anxious.  Well groomed, good eye contact.    ASSESSMENT AND PLAN:   Audrey Tucker was seen today for establish care.  Diagnoses and all orders for this visit:  Lab Results  Component Value Date   CREATININE 0.65 06/29/2016   BUN 21 06/29/2016   NA 139 06/29/2016   K 4.1 06/29/2016   CL 107 06/29/2016   CO2 26 06/29/2016   Lab Results  Component Value Date   TSH 2.47 06/29/2016   Lab Results  Component Value Date   CRP 0.1 (L) 06/29/2016   Lab Results  Component Value Date   ESRSEDRATE 1 06/29/2016    Other fatigue  We discussed possible causes including systemic illness, acute disease, psychiatric conditions, and sleep disorders among some. Further recommendations will be given according to labs results.  -     Basic  metabolic panel -     TSH -     Sedimentation rate -     C-reactive protein  Tick bite of right thigh, initial encounter  Repellent recommended when planning on working outdoors, before dressing and on clothes. We discussed signs and symptoms of Lyme disease.  -     B. burgdorfi antibodies  Tension-type headache, not intractable, unspecified chronicity pattern  Initial episode does suggest migraine with aura but this residual daily "naggy" headache sounds like tension like headache. We discussed other possible causes, including migraine and less likely temporal arteritis. We discussed treatment options and side effects of OTC NSAID's. She agrees with trying Nortriptyline low dose, she understand side effects. If this medication does not help we will consider Topamax. Instructed about warning signs. F/U in 2 months.  -     nortriptyline (PAMELOR) 10 MG capsule; Take 1 capsule (10 mg total) by mouth at bedtime. -     Sedimentation rate -     C-reactive  protein      Betty G. Martinique, MD  [facility]. [place] office.

## 2016-06-29 NOTE — Patient Instructions (Signed)
A few things to remember from today's visit:   Other fatigue - Plan: Basic metabolic panel, TSH  Tick bite of right thigh, initial encounter - Plan: B. burgdorfi antibodies  Tension-type headache, not intractable, unspecified chronicity pattern - Plan: nortriptyline (PAMELOR) 10 MG capsule   Please be sure medication list is accurate. If a new problem present, please set up appointment sooner than planned today.

## 2016-06-30 LAB — LYME AB/WESTERN BLOT REFLEX: B burgdorferi Ab IgG+IgM: <0.9 Index (ref <0.90)

## 2016-07-21 DIAGNOSIS — M9903 Segmental and somatic dysfunction of lumbar region: Secondary | ICD-10-CM | POA: Diagnosis not present

## 2016-07-21 DIAGNOSIS — S29012A Strain of muscle and tendon of back wall of thorax, initial encounter: Secondary | ICD-10-CM | POA: Diagnosis not present

## 2016-07-21 DIAGNOSIS — M9902 Segmental and somatic dysfunction of thoracic region: Secondary | ICD-10-CM | POA: Diagnosis not present

## 2016-07-21 DIAGNOSIS — M47817 Spondylosis without myelopathy or radiculopathy, lumbosacral region: Secondary | ICD-10-CM | POA: Diagnosis not present

## 2016-07-21 DIAGNOSIS — S39012A Strain of muscle, fascia and tendon of lower back, initial encounter: Secondary | ICD-10-CM | POA: Diagnosis not present

## 2016-07-21 DIAGNOSIS — M9905 Segmental and somatic dysfunction of pelvic region: Secondary | ICD-10-CM | POA: Diagnosis not present

## 2016-07-26 ENCOUNTER — Telehealth: Payer: Self-pay | Admitting: *Deleted

## 2016-07-26 DIAGNOSIS — G43809 Other migraine, not intractable, without status migrainosus: Secondary | ICD-10-CM | POA: Insufficient documentation

## 2016-07-26 DIAGNOSIS — G44209 Tension-type headache, unspecified, not intractable: Secondary | ICD-10-CM

## 2016-07-26 MED ORDER — NORTRIPTYLINE HCL 10 MG PO CAPS: 10.0000 mg | ORAL_CAPSULE | Freq: Every day | ORAL | 0 refills | Status: DC

## 2016-07-26 NOTE — Telephone Encounter (Signed)
[facility] faxed a note requesting a 90-day supply of Nortiptyline 10mg -take 1 capsule by mouth at bedtime as a 30 day supply was sent in.

## 2016-07-26 NOTE — Telephone Encounter (Signed)
Rx sent for 90 days with 0 refills to see how medication does during follow up visit.

## 2016-07-26 NOTE — Progress Notes (Signed)
HPI:   Audrey Tucker is a 71 y.o. female, who is here today to follow on recent OV.   She was seen on 06/29/16, when she was c/o fronto parietal and bitemporal headache, first episode about 2-3 months ago, proceeded by visual aura.She continue with daily headache, "naggy" like headache. + Fatigue: Stable, she thinks it is related to her age.   She has followed with neuro and brain MRI was negative in 05/2016. Dx with migraine variant. Last OV I recommended Nortriptyline 10 mg daily, which she did not tolerate well, so discontinued after taking it a couple times.It caused drowsiness, not able to function next day.   Headache has resolved.States that she had 1-2 more days of headache after her last visit.  Hand arthralgias: For a while she has had IP joint pain,mainy right 5th finger, stiffness. Exacerbated by certain activities that require repetition/hand movement. She has not noted edema or erythema. She has not tried OTC meds. No significant limitation of ROM. Slowly getting worse. States that she is not interested in Rx medication.   Lab Results  Component Value Date   ESRSEDRATE 1 06/29/2016   Lab Results  Component Value Date   CRP 0.1 (L) 06/29/2016     Review of Systems  Constitutional: Positive for fatigue (no more than usual). Negative for appetite change, fever and unexpected weight change.  HENT: Negative for mouth sores and trouble swallowing.   Eyes: Negative for photophobia and visual disturbance.  Respiratory: Negative for shortness of breath and wheezing.   Cardiovascular: Negative for palpitations and leg swelling.  Gastrointestinal: Negative for abdominal pain, nausea and vomiting.       No changes in bowel habits.  Musculoskeletal: Positive for arthralgias. Negative for gait problem and neck pain.  Skin: Negative for pallor and rash.  Neurological: Negative for syncope, weakness, numbness and headaches.  Psychiatric/Behavioral: Negative for  confusion. The patient is not nervous/anxious.      Current Outpatient Prescriptions on File Prior to Visit  Medication Sig Dispense Refill  . MIMVEY 1-0.5 MG per tablet Take 1 tablet by mouth every other day.     Marland Kitchen LORazepam (ATIVAN) 0.5 MG tablet Take 0.5 mg by mouth as needed for anxiety.     No current facility-administered medications on file prior to visit.      Past Medical History:  Diagnosis Date  . Arthritis   . Chicken pox   . Frequent headaches   . PONV (postoperative nausea and vomiting)   . Seasonal allergies    No Known Allergies  Social History   Social History  . Marital status: Married    Spouse name: N/A  . Number of children: N/A  . Years of education: N/A   Social History Main Topics  . Smoking status: Former Research scientist (life sciences)  . Smokeless tobacco: Never Used  . Alcohol use Yes     Comment: social  . Drug use: No  . Sexual activity: Not Asked   Other Topics Concern  . None   Social History Narrative  . None    Vitals:   07/27/16 0925  BP: 100/70  Pulse: 72  Resp: 12   Body mass index is 21.65 kg/m.   Physical Exam  Nursing note and vitals reviewed. Constitutional: She is oriented to person, place, and time. She appears well-developed and well-nourished. No distress.  HENT:  Head: Atraumatic.  Mouth/Throat: Oropharynx is clear and moist and mucous membranes are normal.  Eyes: Conjunctivae and  EOM are normal. Pupils are equal, round, and reactive to light.  Cardiovascular: Normal rate and regular rhythm.   No murmur heard. Respiratory: Effort normal and breath sounds normal. No respiratory distress.  Musculoskeletal: She exhibits no edema or tenderness.       Cervical back: She exhibits no tenderness and no bony tenderness.  IP small nodular changes on a few fingers,bilateral. 5th right finger: DIP mild limited extension. No signs of synovitis.  Neurological: She is alert and oriented to person, place, and time. She has normal strength.  No cranial nerve deficit. Gait normal.  Skin: Skin is warm. No rash noted. No erythema.  Psychiatric: She has a normal mood and affect.  Well groomed, good eye contact.    ASSESSMENT AND PLAN:   Dajai was seen today for follow-up.  Diagnoses and all orders for this visit:  Generalized osteoarthritis of hand  Educated about Dx, prognosis,and treatment options. She is not interested in Rx medications. OTC Tumeric and/or Fish oil may help. NSAID's if needed and not frequent is appropriate as well, some side effects discussed. F/U as needed.  Headache, variant migraine  She has not had another episode of migraine with aura and residual headache she was having after first migraine has resolved. Instructed about warning signs. Imitrex as needed recommended. Some side effects discussed.  -     SUMAtriptan (IMITREX) 50 MG tablet; Take 1 tablet (50 mg total) by mouth 2 (two) times daily as needed for migraine. May repeat in 2 hours if headache persists or recurs.    Betty G. Martinique, MD  [facility]. [place] office.

## 2016-07-27 ENCOUNTER — Encounter: Payer: Self-pay | Admitting: Family Medicine

## 2016-07-27 ENCOUNTER — Ambulatory Visit (INDEPENDENT_AMBULATORY_CARE_PROVIDER_SITE_OTHER): Payer: PPO | Admitting: Family Medicine

## 2016-07-27 VITALS — BP 100/70 | HR 72 | Resp 12 | Ht 65.0 in | Wt 130.1 lb

## 2016-07-27 DIAGNOSIS — G43809 Other migraine, not intractable, without status migrainosus: Secondary | ICD-10-CM

## 2016-07-27 DIAGNOSIS — M159 Polyosteoarthritis, unspecified: Secondary | ICD-10-CM | POA: Diagnosis not present

## 2016-07-27 MED ORDER — SUMATRIPTAN SUCCINATE 50 MG PO TABS: 50.0000 mg | ORAL_TABLET | Freq: Two times a day (BID) | ORAL | 1 refills | Status: DC | PRN

## 2016-07-27 NOTE — Patient Instructions (Addendum)
A few things to remember from today's visit:   Headache, variant migraine - Plan: SUMAtriptan (IMITREX) 50 MG tablet  Osteoarthritis is a chronic condition and gets worse with age.  The following may help:  Over the counter topical medications: Icy Hot or Asper cream with Lidocaine. Tai Chi or PT. Fall prevention. Avoid weight gain. Fish oil, over the counter Megared for example, 2 capsules daily.  Tumeric.  Omega XL has fish oil.   Please be sure medication list is accurate. If a new problem present, please set up appointment sooner than planned today.

## 2016-07-28 DIAGNOSIS — S29012A Strain of muscle and tendon of back wall of thorax, initial encounter: Secondary | ICD-10-CM | POA: Diagnosis not present

## 2016-07-28 DIAGNOSIS — S39012A Strain of muscle, fascia and tendon of lower back, initial encounter: Secondary | ICD-10-CM | POA: Diagnosis not present

## 2016-07-28 DIAGNOSIS — M9902 Segmental and somatic dysfunction of thoracic region: Secondary | ICD-10-CM | POA: Diagnosis not present

## 2016-07-28 DIAGNOSIS — M47817 Spondylosis without myelopathy or radiculopathy, lumbosacral region: Secondary | ICD-10-CM | POA: Diagnosis not present

## 2016-07-28 DIAGNOSIS — M9903 Segmental and somatic dysfunction of lumbar region: Secondary | ICD-10-CM | POA: Diagnosis not present

## 2016-07-28 DIAGNOSIS — M9905 Segmental and somatic dysfunction of pelvic region: Secondary | ICD-10-CM | POA: Diagnosis not present

## 2016-07-31 ENCOUNTER — Encounter: Payer: Self-pay | Admitting: Family Medicine

## 2016-08-03 IMAGING — MR MR HEAD WO/W CM
12 of 13 series · 43 of 48 positions shown · IV contrast (multihance)
Comparison: None.

CLINICAL DATA: 68 -year-old female with bilateral sensorineural
hearing loss. Bilateral tinnitus. Headaches, possible migraines.
Symptoms began 10/21/2013. Initial encounter.

BUN and creatinine were obtained on site at [HOSPITAL] at
[HOSPITAL].
Results:  BUN 19 mg/dL,  Creatinine 0.6 mg/dL.
EXAM:
MRI HEAD WITHOUT AND WITH CONTRAST
TECHNIQUE: Multiplanar, multiecho pulse sequences of the brain and surrounding
structures were obtained without and with intravenous contrast.
CONTRAST:  10mL MULTIHANCE GADOBENATE DIMEGLUMINE 529 MG/ML IV SOLN

[Series 2: T1 · sagittal · 5.0mm · 0.45mm/px · 2 of 21 slices shown]
[im 1/21]
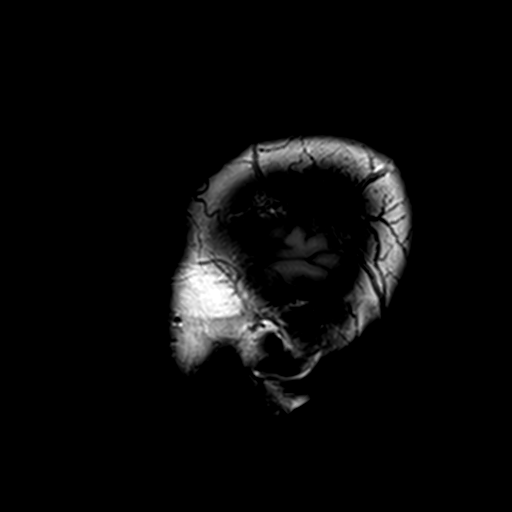
[im 21/21]
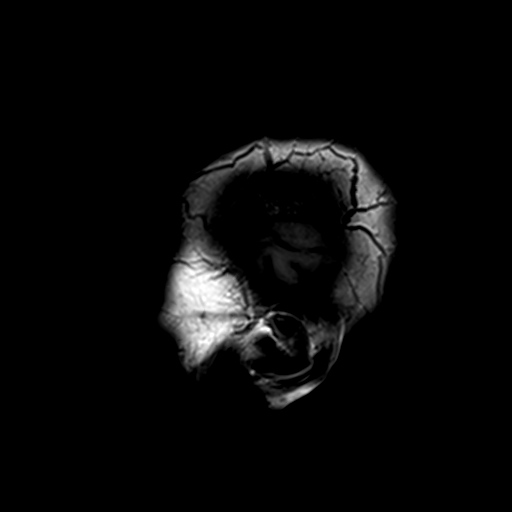

[Series 3: DWI · axial · 5.0mm · 1.80mm/px · z∈[-4,+135]mm · 3 of 44 slices shown (1 of 4)]
[im 1/44]
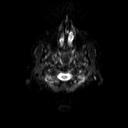
[im 22/44]
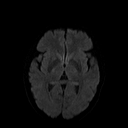
[im 44/44]
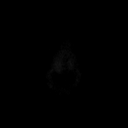

[Series 4: DWI · axial · 5.0mm · 1.80mm/px · z∈[-4,+135]mm · 2 of 22 slices shown (2 of 4)]
[im 1/22]
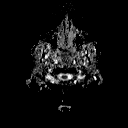
[im 22/22]
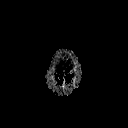

[Series 5: mip_images(sw) · axial · 16.0mm · 0.90mm/px · z∈[-5,+137]mm · 6 of 73 slices shown]
[im 1/73]
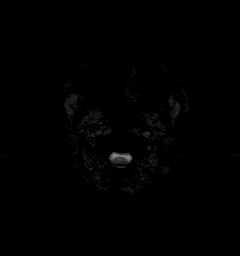
[im 15/73]
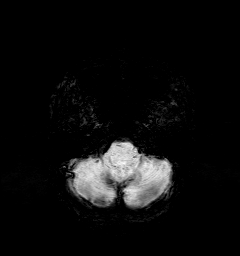
[im 29/73]
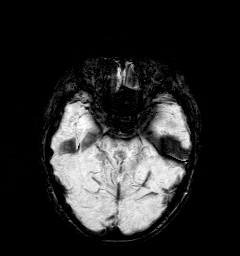
[im 44/73]
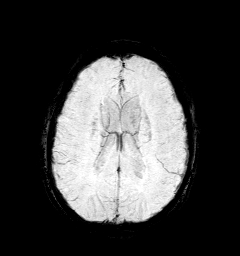
[im 58/73]
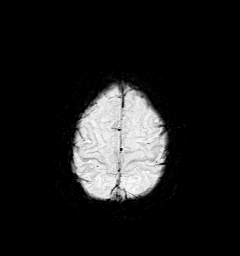
[im 73/73]
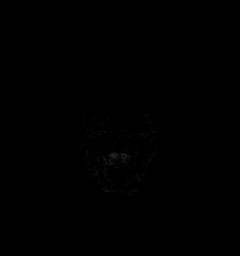

[Series 6: swi_images · axial · 2.0mm · 0.90mm/px · z∈[-12,+143]mm · 6 of 80 slices shown]
[im 1/80]
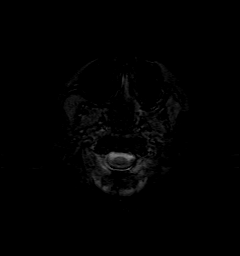
[im 16/80]
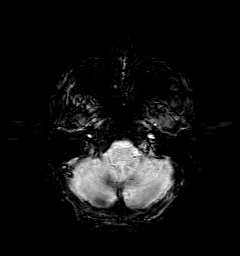
[im 32/80]
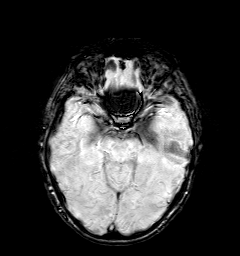
[im 48/80]
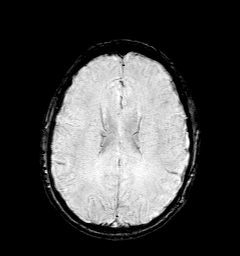
[im 64/80]
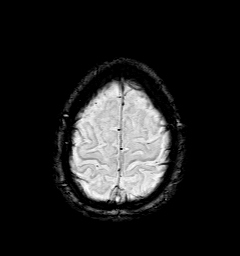
[im 80/80]
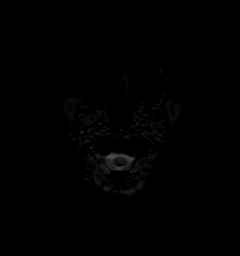

[Series 7: DWI · coronal · 5.0mm · 1.80mm/px · 6 of 72 slices shown (3 of 4)]
[im 1/72]
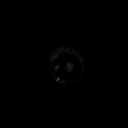
[im 15/72]
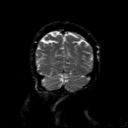
[im 29/72]
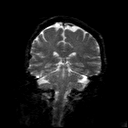
[im 43/72]
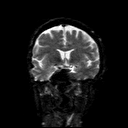
[im 57/72]
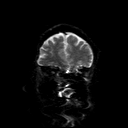
[im 72/72]
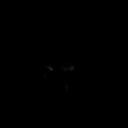

[Series 8: DWI · coronal · 5.0mm · 1.80mm/px · 3 of 36 slices shown (4 of 4)]
[im 1/36]
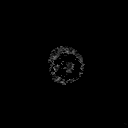
[im 18/36]
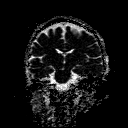
[im 36/36]
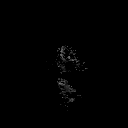

[Series 9: T2 · axial · 5.0mm · 0.51mm/px · z∈[-12,+128]mm · 2 of 22 slices shown (1 of 2)]
[im 1/22]
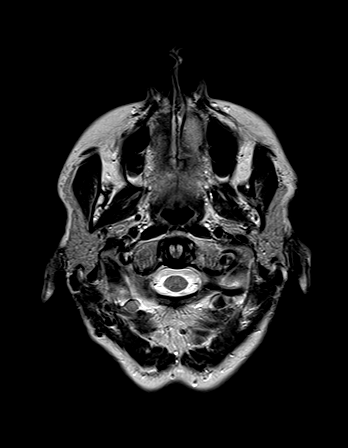
[im 22/22]
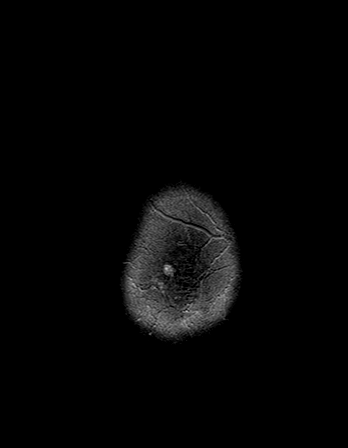

[Series 10: FLAIR · axial · 5.0mm · 0.45mm/px · z∈[-13,+127]mm · 2 of 22 slices shown]
[im 1/22]
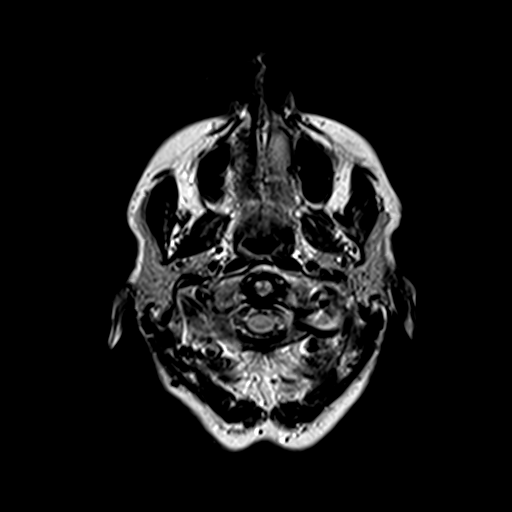
[im 22/22]
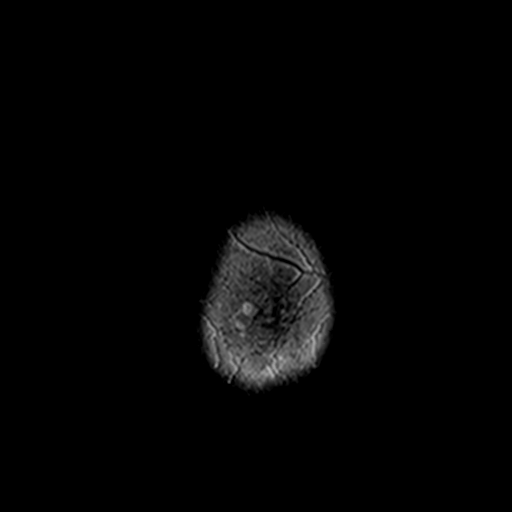

[Series 11: t1_mpr_tra · axial · 2.0mm · 0.45mm/px · z∈[-16,+139]mm · 6 of 80 slices shown (1 of 2)]
[im 1/80]
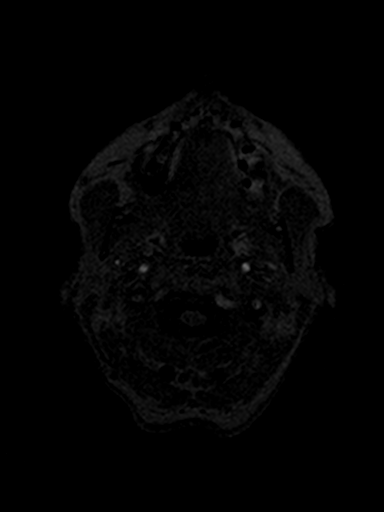
[im 16/80]
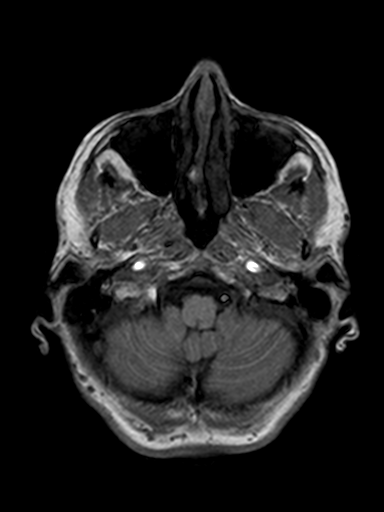
[im 32/80]
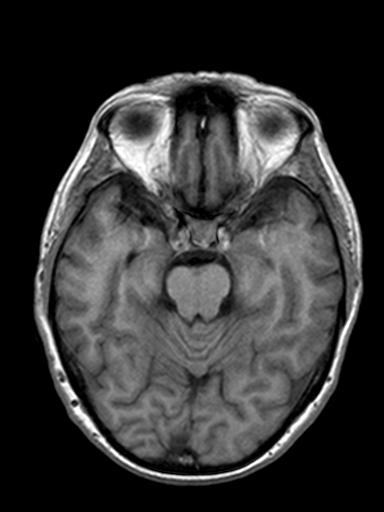
[im 48/80]
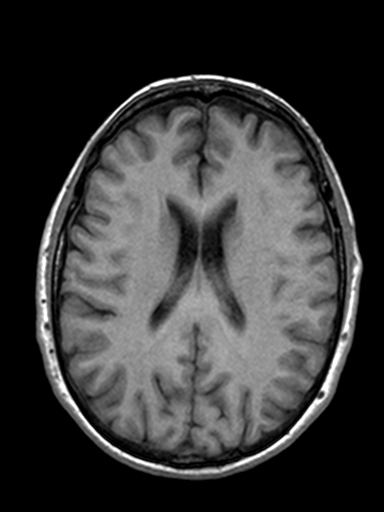
[im 64/80]
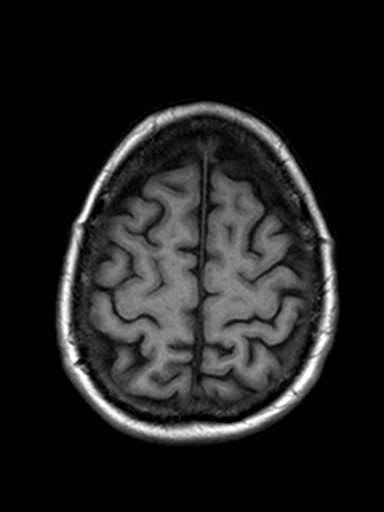
[im 80/80]
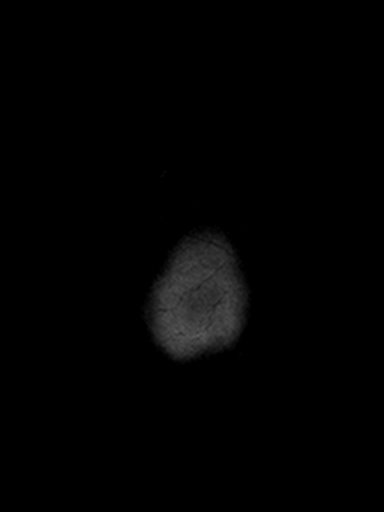

[Series 12: T2 · coronal · 5.0mm · 0.45mm/px · 2 of 28 slices shown (2 of 2)]
[im 1/28]
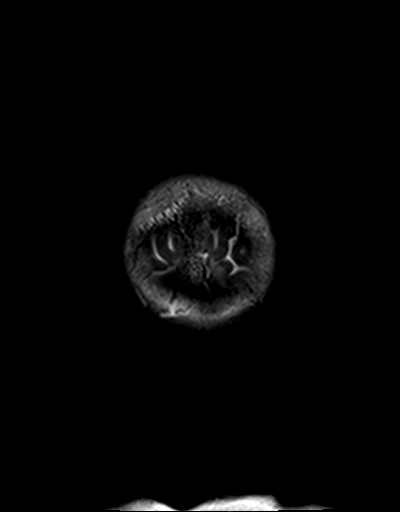
[im 28/28]
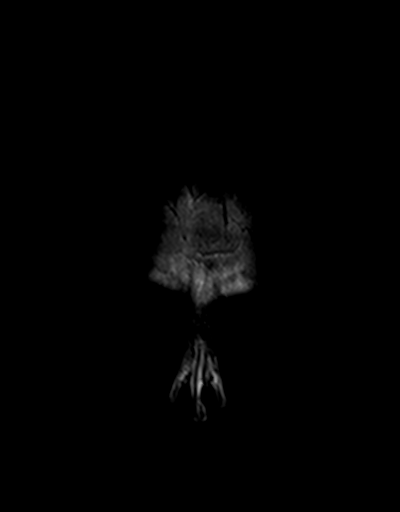

[Series 13: t1_mpr_tra · axial · 2.0mm · 0.45mm/px · z∈[-16,+45]mm · 3 of 80 slices shown (2 of 2)]
[im 1/80]
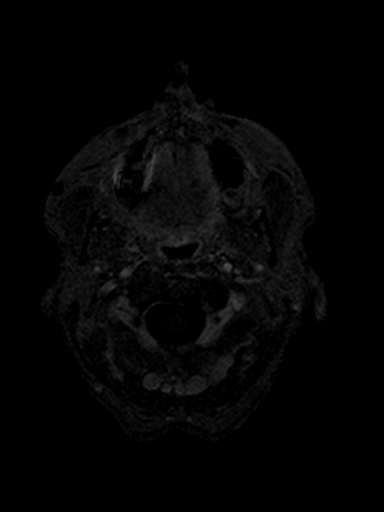
[im 16/80]
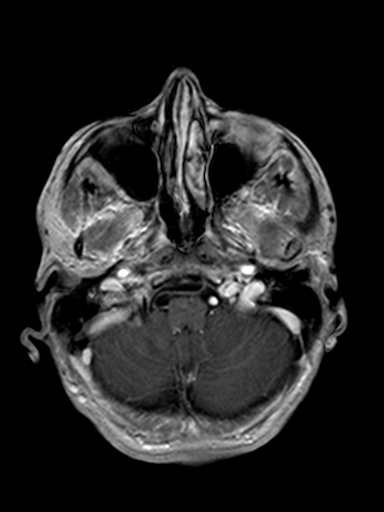
[im 32/80]
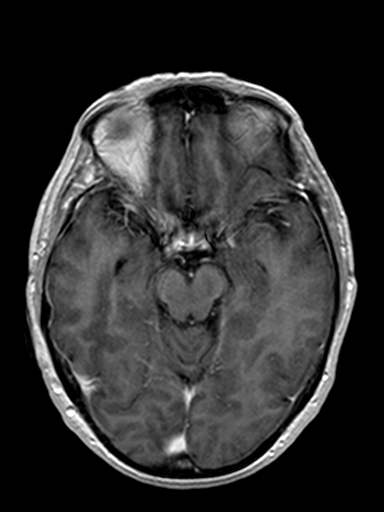

[43 of 48 positions shown; findings below may reference images not displayed]

FINDINGS: Cerebral volume is normal. No restricted diffusion to suggest acute
infarction. No midline shift, mass effect, evidence of mass lesion,
ventriculomegaly, extra-axial collection or acute intracranial
hemorrhage. Cervicomedullary junction and pituitary are within
normal limits. Negative visualized cervical spine. Major
intracranial vascular flow voids are preserved, with dominant distal
left vertebral artery. CSF pulsation artifact felt responsible for
the mildly heterogeneous appearance of seen along the distal right
vertebral artery on series 9, image 4, not correlated with abnormal
signal on other sequences.

Gray and white matter signal is within normal limits for age
throughout the brain. No abnormal enhancement identified.

Visualized orbit soft tissues are within normal limits. Paranasal
sinuses are clear. Normal bone marrow signal. Visualized scalp soft
tissues are within normal limits.

Visible internal auditory structures appear normal. No abnormal
internal auditory enhancement identified. Mastoids are clear.
Parotid glands appear normal.
IMPRESSION: No acute intracranial abnormality. Normal for age MRI appearance of
the brain.

## 2016-08-04 DIAGNOSIS — S39012A Strain of muscle, fascia and tendon of lower back, initial encounter: Secondary | ICD-10-CM | POA: Diagnosis not present

## 2016-08-04 DIAGNOSIS — S29012A Strain of muscle and tendon of back wall of thorax, initial encounter: Secondary | ICD-10-CM | POA: Diagnosis not present

## 2016-08-04 DIAGNOSIS — M47817 Spondylosis without myelopathy or radiculopathy, lumbosacral region: Secondary | ICD-10-CM | POA: Diagnosis not present

## 2016-08-04 DIAGNOSIS — M9903 Segmental and somatic dysfunction of lumbar region: Secondary | ICD-10-CM | POA: Diagnosis not present

## 2016-08-04 DIAGNOSIS — M9905 Segmental and somatic dysfunction of pelvic region: Secondary | ICD-10-CM | POA: Diagnosis not present

## 2016-08-04 DIAGNOSIS — M9902 Segmental and somatic dysfunction of thoracic region: Secondary | ICD-10-CM | POA: Diagnosis not present

## 2016-08-09 ENCOUNTER — Ambulatory Visit (INDEPENDENT_AMBULATORY_CARE_PROVIDER_SITE_OTHER): Payer: PPO | Admitting: Orthopaedic Surgery

## 2016-08-09 DIAGNOSIS — M7062 Trochanteric bursitis, left hip: Secondary | ICD-10-CM

## 2016-08-09 DIAGNOSIS — M7061 Trochanteric bursitis, right hip: Secondary | ICD-10-CM

## 2016-08-09 MED ORDER — LIDOCAINE HCL 1 % IJ SOLN
3.0000 mL | INTRAMUSCULAR | Status: AC | PRN
  Administered 2016-08-09: 3 mL

## 2016-08-09 MED ORDER — METHYLPREDNISOLONE ACETATE 40 MG/ML IJ SUSP
40.0000 mg | INTRAMUSCULAR | Status: AC | PRN
  Administered 2016-08-09: 40 mg via INTRA_ARTICULAR

## 2016-08-09 NOTE — Progress Notes (Signed)
Office Visit Note   Patient: Audrey Tucker           Date of Birth: 23-Jun-1945           MRN: [id number] Visit Date: 08/09/2016              Requested by: Martinique, Betty G, MD [address] [place], [place] [address] PCP: Martinique, Betty G, MD   Assessment & Plan: Visit Diagnoses:  1. Trochanteric bursitis of both hips     Plan: I agree with her need for steroid injection in both trochanteric areas. She demonstrated the stretching exercises back to me we'll continue these and will avoid high-impact aerobic activities for Amber next week. She tolerated both steroid injections well we did have a thorough discussion of risks and benefits of these injections. She'll follow-up as needed.  Follow-Up Instructions: Return if symptoms worsen or fail to improve.   Orders:  Orders Placed This Encounter  Procedures  . Large Joint Injection/Arthrocentesis  . Large Joint Injection/Arthrocentesis   No orders of the defined types were placed in this encounter.     Procedures: Large Joint Inj Date/Time: 08/09/2016 2:18 PM Performed by: Mcarthur Rossetti Authorized by: Jean Rosenthal Y   Location:  Hip Site:  R greater trochanter Ultrasound Guidance: No   Fluoroscopic Guidance: No   Arthrogram: No   Medications:  3 mL lidocaine 1 %; 40 mg methylPREDNISolone acetate 40 MG/ML Large Joint Inj Date/Time: 08/09/2016 2:19 PM Performed by: Mcarthur Rossetti Authorized by: Mcarthur Rossetti   Location:  Hip Site:  L greater trochanter Ultrasound Guidance: No   Fluoroscopic Guidance: No   Arthrogram: No   Medications:  3 mL lidocaine 1 %; 40 mg methylPREDNISolone acetate 40 MG/ML     Clinical Data: No additional findings.   Subjective: No chief complaint on file. The patient has had an acute flareup of her trochanteric bursitis that involved both her hips. She's had an injection in each of about 7 months ago. She is a very active and young-appearing  71-year-old and has pain over the trochanteric area which he points on both hips. She denies any groin pain and denies any change in bowel or bladder function or knee pain. She denies any back issues. She like to have a steroid injection in both trochanteric areas today.  HPI  Review of Systems She denies any headache, chest pain, short of breath, fever, chills, nausea, vomiting.  Objective: Vital Signs: There were no vitals taken for this visit.  Physical Exam She is alert and oriented 3 and in no acute distress Ortho Exam Semination both hips show fluid and full range of motion of both hips. There is no pain in the groin or either side. She has pain of the trochanteric area to palpation on both sides. Her leg lengths are equal and she has excellent range of motion of both hips and both knees. Specialty Comments:  No specialty comments available.  Imaging: No results found.   PMFS History: Patient Active Problem List   Diagnosis Date Noted  . Trochanteric bursitis of both hips 08/09/2016  . Generalized osteoarthritis of hand 07/27/2016  . Headache, variant migraine 07/26/2016   Past Medical History:  Diagnosis Date  . Arthritis   . Chicken pox   . Frequent headaches   . PONV (postoperative nausea and vomiting)   . Seasonal allergies     Family History  Problem Relation Age of Onset  . Arthritis Mother   .  Hypertension Mother   . Arthritis Father   . Cancer Father        lung    Past Surgical History:  Procedure Laterality Date  . FOOT NEUROMA SURGERY Right 04-29-15   Dr Paulla Dolly  . SHOULDER ARTHROSCOPY Left    Social History   Occupational History  . Not on file.   Social History Main Topics  . Smoking status: Former Research scientist (life sciences)  . Smokeless tobacco: Never Used  . Alcohol use Yes     Comment: social  . Drug use: No  . Sexual activity: Not on file

## 2016-08-25 DIAGNOSIS — S29012A Strain of muscle and tendon of back wall of thorax, initial encounter: Secondary | ICD-10-CM | POA: Diagnosis not present

## 2016-08-25 DIAGNOSIS — M47817 Spondylosis without myelopathy or radiculopathy, lumbosacral region: Secondary | ICD-10-CM | POA: Diagnosis not present

## 2016-08-25 DIAGNOSIS — M9905 Segmental and somatic dysfunction of pelvic region: Secondary | ICD-10-CM | POA: Diagnosis not present

## 2016-08-25 DIAGNOSIS — M9902 Segmental and somatic dysfunction of thoracic region: Secondary | ICD-10-CM | POA: Diagnosis not present

## 2016-08-25 DIAGNOSIS — M9903 Segmental and somatic dysfunction of lumbar region: Secondary | ICD-10-CM | POA: Diagnosis not present

## 2016-08-25 DIAGNOSIS — S39012A Strain of muscle, fascia and tendon of lower back, initial encounter: Secondary | ICD-10-CM | POA: Diagnosis not present

## 2016-10-04 DIAGNOSIS — Z124 Encounter for screening for malignant neoplasm of cervix: Secondary | ICD-10-CM | POA: Diagnosis not present

## 2016-10-04 DIAGNOSIS — Z6822 Body mass index (BMI) 22.0-22.9, adult: Secondary | ICD-10-CM | POA: Diagnosis not present

## 2016-10-22 DIAGNOSIS — M8588 Other specified disorders of bone density and structure, other site: Secondary | ICD-10-CM | POA: Diagnosis not present

## 2016-10-22 DIAGNOSIS — Z1231 Encounter for screening mammogram for malignant neoplasm of breast: Secondary | ICD-10-CM | POA: Diagnosis not present

## 2016-10-22 DIAGNOSIS — N958 Other specified menopausal and perimenopausal disorders: Secondary | ICD-10-CM | POA: Diagnosis not present

## 2016-10-22 DIAGNOSIS — N951 Menopausal and female climacteric states: Secondary | ICD-10-CM | POA: Diagnosis not present

## 2016-10-29 ENCOUNTER — Other Ambulatory Visit: Payer: Self-pay | Admitting: Family Medicine

## 2016-10-29 DIAGNOSIS — G44209 Tension-type headache, unspecified, not intractable: Secondary | ICD-10-CM

## 2016-12-31 ENCOUNTER — Encounter: Payer: Self-pay | Admitting: Internal Medicine

## 2016-12-31 ENCOUNTER — Ambulatory Visit: Payer: PPO | Admitting: Family Medicine

## 2016-12-31 ENCOUNTER — Encounter: Payer: Self-pay | Admitting: Family Medicine

## 2016-12-31 VITALS — BP 110/66 | HR 68 | Temp 98.1°F | Resp 12 | Ht 65.0 in | Wt 132.4 lb

## 2016-12-31 DIAGNOSIS — G2581 Restless legs syndrome: Secondary | ICD-10-CM | POA: Diagnosis not present

## 2016-12-31 DIAGNOSIS — K625 Hemorrhage of anus and rectum: Secondary | ICD-10-CM | POA: Diagnosis not present

## 2016-12-31 MED ORDER — LORAZEPAM 0.5 MG PO TABS: 0.5000 mg | ORAL_TABLET | ORAL | 0 refills | Status: DC | PRN

## 2016-12-31 NOTE — Progress Notes (Signed)
ACUTE VISIT   HPI:  Chief Complaint  Patient presents with  . Blood In Stools    Audrey Tucker is a 71 y.o. female, who is here today complaining of red blood per rectum, she noted 6 days ago and lasted about 3 days. Blood was covering stool and water in toilet was pink.  She had one episode of diarrhea.  A few days before problem onset she ate a good portion of beet salad. 2 weeks ago she noted mucus on wipe after defecation. Not sure about exacerbating or alleviating factors.  She thinks she may have strained a "little." She denies dyschezia.  Denies fever,chills,abnormal wt loss, gum/nose bleed, gross hematuria,abdominal pain, nausea, vomiting, melena, or vaginal bleeding.  Colonoscopy 02/15/2010.  -She is also requesting a refill on Ativan 0.5 mg, which she has been taken for years and prescribed by her gyn to treat RLS. According to pt, her gynecologist is retiring. She takes medication as needed, usually 30 tabs lasts months,almost a year. She thinks medication helps her sleep when she has leg discomfort, R>L. She is not sue about aggravating factors. Problem has been stable for years.  No associated leg pain,edema,or erythema. No hx of anemia.   Review of Systems  Constitutional: Negative for activity change, appetite change, fatigue, fever and unexpected weight change.  HENT: Negative for mouth sores, nosebleeds, sore throat and trouble swallowing.   Respiratory: Negative for cough, shortness of breath and wheezing.   Cardiovascular: Negative for chest pain, palpitations and leg swelling.  Gastrointestinal: Positive for anal bleeding, blood in stool and diarrhea. Negative for abdominal distention, abdominal pain, nausea and vomiting.  Endocrine: Negative for cold intolerance and heat intolerance.  Genitourinary: Negative for dysuria, frequency, hematuria, menstrual problem, vaginal bleeding and vaginal discharge.  Musculoskeletal: Negative for back  pain and gait problem.  Skin: Negative for pallor and rash.  Allergic/Immunologic: Negative for food allergies.  Neurological: Negative for syncope, weakness and headaches.  Hematological: Negative for adenopathy. Does not bruise/bleed easily.  Psychiatric/Behavioral: Negative for confusion. The patient is nervous/anxious.       Current Outpatient Medications on File Prior to Visit  Medication Sig Dispense Refill  . PARoxetine Mesylate 7.5 MG CAPS Take 1 tablet daily by mouth.  11  . SUMAtriptan (IMITREX) 50 MG tablet Take 1 tablet (50 mg total) by mouth 2 (two) times daily as needed for migraine. May repeat in 2 hours if headache persists or recurs. 10 tablet 1   No current facility-administered medications on file prior to visit.      Past Medical History:  Diagnosis Date  . Arthritis   . Chicken pox   . Frequent headaches   . PONV (postoperative nausea and vomiting)   . Seasonal allergies    No Known Allergies  Social History   Socioeconomic History  . Marital status: Married    Spouse name: None  . Number of children: None  . Years of education: None  . Highest education level: None  Social Needs  . Financial resource strain: None  . Food insecurity - worry: None  . Food insecurity - inability: None  . Transportation needs - medical: None  . Transportation needs - non-medical: None  Occupational History  . None  Tobacco Use  . Smoking status: Former Research scientist (life sciences)  . Smokeless tobacco: Never Used  Substance and Sexual Activity  . Alcohol use: Yes    Comment: social  . Drug use: No  . Sexual activity: None  Other Topics Concern  . None  Social History Narrative  . None    Vitals:   12/31/16 1102  BP: 110/66  Pulse: 68  Resp: 12  Temp: 98.1 F (36.7 C)  SpO2: 96%   Body mass index is 22.03 kg/m.   Physical Exam  Nursing note and vitals reviewed. Constitutional: She is oriented to person, place, and time. She appears well-developed and  well-nourished. She does not appear ill. No distress.  HENT:  Head: Normocephalic and atraumatic.  Mouth/Throat: Oropharynx is clear and moist and mucous membranes are normal.  Eyes: Conjunctivae are normal. No scleral icterus.  Cardiovascular: Normal rate and regular rhythm.  No murmur heard. Pulses:      Dorsalis pedis pulses are 2+ on the right side, and 2+ on the left side.  Respiratory: Effort normal and breath sounds normal. No respiratory distress.  GI: Soft. Bowel sounds are normal. She exhibits no distension and no mass. There is no hepatomegaly. There is no tenderness.  Genitourinary: Rectal exam shows external hemorrhoid. Rectal exam shows no fissure, no mass, no tenderness, anal tone normal and guaiac negative stool.  Musculoskeletal: She exhibits no edema or tenderness.  Lymphadenopathy:    She has no cervical adenopathy.       Right: No supraclavicular adenopathy present.       Left: No supraclavicular adenopathy present.  Neurological: She is alert and oriented to person, place, and time. She has normal strength. Gait normal.  Skin: Skin is warm. No rash noted. No erythema. No pallor.  Psychiatric: Her mood appears anxious.  Well groomed, good eye contact.    ASSESSMENT AND PLAN:   [name] was seen today for blood in stools.  Diagnoses and all orders for this visit:  Rectal bleed  Today rectal exam negative except for mild external hemorrhoids, not actively inflamed. Some possible causes dicussed. ? related to beet consumption, states that she has eaten beets before and did not turn stool red. She is positive it was blood covering stools. Avoid straining, increase fiber and fluid intake. Instructed about warning signs. Referral to GI placed.  -     Ambulatory referral to Gastroenterology  RLS (restless legs syndrome)  Stable. She also has some anxiety. No changes in current management, side effects of Ativan discussed. F/U in 6-12 months or when refill  is needed.  -     LORazepam (ATIVAN) 0.5 MG tablet; Take 1 tablet (0.5 mg total) as needed by mouth for anxiety.    -Audrey Tucker was advised to seek immediate medical attention if sudden worsening symptoms.      Audrey G. Martinique, MD  [facility]. [place] office.

## 2016-12-31 NOTE — Patient Instructions (Addendum)
A few things to remember from today's visit:   Rectal bleed - Plan: Ambulatory referral to Gastroenterology  Avoid straining, increase fiber intake, and GI appt will be arranged.   Please be sure medication list is accurate. If a new problem present, please set up appointment sooner than planned today.

## 2017-01-03 ENCOUNTER — Ambulatory Visit: Payer: PPO | Admitting: Family Medicine

## 2017-01-05 ENCOUNTER — Ambulatory Visit: Payer: PPO | Admitting: Internal Medicine

## 2017-01-05 ENCOUNTER — Encounter: Payer: Self-pay | Admitting: Internal Medicine

## 2017-01-05 VITALS — BP 104/62 | HR 66 | Ht 64.5 in | Wt 132.5 lb

## 2017-01-05 DIAGNOSIS — K625 Hemorrhage of anus and rectum: Secondary | ICD-10-CM

## 2017-01-05 MED ORDER — NA SULFATE-K SULFATE-MG SULF 17.5-3.13-1.6 GM/177ML PO SOLN: 1.0000 | Freq: Once | ORAL | 0 refills | Status: AC

## 2017-01-05 NOTE — Progress Notes (Signed)
HISTORY OF PRESENT ILLNESS:  Audrey Tucker is a 71 y.o. female , retired Futures trader, with no significant past medical history who is referred by her primary care provider Audrey Tucker regarding rectal bleeding. She was evaluated by Audrey Tucker for this problem 12/31/2016. At office note has been reviewed. Outside blood work from May 2018 revealed unremarkable this metabolic panel and normal C-reactive protein and TSH and sedimentation rate.. Patient reports that several weeks ago she noticed blood in the toilet water after a bowel movement. Problem occurred for 3 consecutive days. Also mucoid substance per rectum. There is no associated abdominal pain, change in bowel habits, or rectal discomfort. The patient does report having undergone colonoscopy in [place] in 2009. No records available for review but she reports no problems such as polyps. She tells me that she did have internal hemorrhoids. GI review of systems is otherwise negative. No family history of colon cancer.  REVIEW OF SYSTEMS:  All non-GI ROS negative except for  Past Medical History:  Diagnosis Date  . Arthritis   . Chicken pox   . Frequent headaches   . PONV (postoperative nausea and vomiting)   . Seasonal allergies     Past Surgical History:  Procedure Laterality Date  . FOOT NEUROMA SURGERY Right 04-29-15   Dr Audrey Tucker  . SHOULDER ARTHROSCOPY Left     Social History Audrey Tucker  reports that she has quit smoking. she has never used smokeless tobacco. She reports that she drinks alcohol. She reports that she does not use drugs.  family history includes Arthritis in her father and mother; Cancer in her father; Hypertension in her mother.  No Known Allergies     PHYSICAL EXAMINATION: Vital signs: BP 104/62   Pulse 66   Ht 5' 4.5" (1.638 m)   Wt 132 lb 8 oz (60.1 kg)   BMI 22.39 kg/m   Constitutional: generally well-appearing, no acute distress Psychiatric: alert and oriented x3,  cooperative Eyes: extraocular movements intact, anicteric, conjunctiva pink Mouth: oral pharynx moist, no lesions Neck: supple no lymphadenopathy Cardiovascular: heart regular rate and rhythm, no murmur Lungs: clear to auscultation bilaterally Abdomen: soft, nontender, nondistended, no obvious ascites, no peritoneal signs, normal bowel sounds, no organomegaly Rectal: Deferred until colonoscopy Extremities: no clubbing, cyanosis, or lower extremity edema bilaterally Skin: no lesions on visible extremities Neuro: No focal deficits. Cranial nerves intact  ASSESSMENT:  #1. Rectal bleeding. Rule out benign anorectal cause. Rule out proctitis. Rule out neoplasia. #2. Reports colonoscopy elsewhere 2009 with internal hemorrhoids   PLAN:  #1. Colonoscopy.The nature of the procedure, as well as the risks, benefits, and alternatives were carefully and thoroughly reviewed with the patient. Ample time for discussion and questions allowed. The patient understood, was satisfied, and agreed to proceed. 2. Further recommendations after the above completed  A copy of this consultation note has been sent to Audrey Tucker

## 2017-01-05 NOTE — Patient Instructions (Signed)

## 2017-02-16 ENCOUNTER — Encounter: Payer: Self-pay | Admitting: Internal Medicine

## 2017-03-01 ENCOUNTER — Encounter: Payer: Self-pay | Admitting: Internal Medicine

## 2017-03-01 ENCOUNTER — Ambulatory Visit (AMBULATORY_SURGERY_CENTER): Payer: PPO | Admitting: Internal Medicine

## 2017-03-01 VITALS — BP 103/52 | HR 64 | Temp 98.7°F | Resp 13 | Ht 64.0 in | Wt 132.0 lb

## 2017-03-01 DIAGNOSIS — K921 Melena: Secondary | ICD-10-CM

## 2017-03-01 DIAGNOSIS — K625 Hemorrhage of anus and rectum: Secondary | ICD-10-CM | POA: Diagnosis not present

## 2017-03-01 DIAGNOSIS — D123 Benign neoplasm of transverse colon: Secondary | ICD-10-CM

## 2017-03-01 DIAGNOSIS — D122 Benign neoplasm of ascending colon: Secondary | ICD-10-CM

## 2017-03-01 MED ORDER — SODIUM CHLORIDE 0.9 % IV SOLN: 500.0000 mL | Freq: Once | INTRAVENOUS | Status: DC

## 2017-03-01 NOTE — Op Note (Signed)
[place] Patient Name: Audrey Tucker Procedure Date: 03/01/2017 3:14 PM MRN: [id number] Endoscopist: Docia Chuck. Henrene Pastor , MD Age: 72 Referring MD:  Date of Birth: 19-May-1945 Gender: Female Account #: [id number] Procedure:                Colonoscopy, With cold snare polypectomy x 5 Indications:              Rectal bleeding. Reported negative screening                            examination 2009 elsewhere Medicines:                Monitored Anesthesia Care Procedure:                Pre-Anesthesia Assessment:                           - Prior to the procedure, a History and Physical                            was performed, and patient medications and                            allergies were reviewed. The patient's tolerance of                            previous anesthesia was also reviewed. The risks                            and benefits of the procedure and the sedation                            options and risks were discussed with the patient.                            All questions were answered, and informed consent                            was obtained. Prior Anticoagulants: The patient has                            taken no previous anticoagulant or antiplatelet                            agents. ASA Grade Assessment: I - A normal, healthy                            patient. After reviewing the risks and benefits,                            the patient was deemed in satisfactory condition to                            undergo the procedure.  After obtaining informed consent, the colonoscope                            was passed under direct vision. Throughout the                            procedure, the patient's blood pressure, pulse, and                            oxygen saturations were monitored continuously. The                            Colonoscope was introduced through the anus and                            advanced to the the  cecum, identified by                            appendiceal orifice and ileocecal valve. The                            ileocecal valve, appendiceal orifice, and rectum                            were photographed. The quality of the bowel                            preparation was excellent. The colonoscopy was                            performed without difficulty. The patient tolerated                            the procedure well. The bowel preparation used was                            SUPREP. Scope In: 3:24:04 PM Scope Out: 3:40:29 PM Scope Withdrawal Time: 0 hours 12 minutes 28 seconds  Total Procedure Duration: 0 hours 16 minutes 25 seconds  Findings:                 Five polyps were found in the transverse colon and                            ascending colon. The polyps were 1 to 3 mm in size.                            These polyps were removed with a cold snare.                            Resection and retrieval were complete.                           A few small-mouthed diverticula were found in the  sigmoid colon.                           Internal hemorrhoids were found during                            retroflexion. The hemorrhoids were small.                           The exam was otherwise without abnormality on                            direct and retroflexion views. Complications:            No immediate complications. Estimated blood loss:                            None. Estimated Blood Loss:     Estimated blood loss: none. Impression:               - Five 1 to 3 mm polyps in the transverse colon and                            in the ascending colon, removed with a cold snare.                            Resected and retrieved.                           - Diverticulosis in the sigmoid colon.                           - Internal hemorrhoids.                           - The examination was otherwise normal on direct                             and retroflexion views. Recommendation:           - Repeat colonoscopy in 5 years for surveillance.                           - Patient has a contact number available for                            emergencies. The signs and symptoms of potential                            delayed complications were discussed with the                            patient. Return to normal activities tomorrow.                            Written discharge instructions were provided to the  patient.                           - Resume previous diet.                           - Continue present medications.                           - Await pathology results. Docia Chuck. Henrene Pastor, MD 03/01/2017 3:45:45 PM This report has been signed electronically.

## 2017-03-01 NOTE — Progress Notes (Signed)
To PACU, VSS. Report to [email or web address] 

## 2017-03-01 NOTE — Progress Notes (Signed)
Patient consents to observer being present for procedure.   

## 2017-03-01 NOTE — Progress Notes (Signed)
Pt's states no medical or surgical changes since previsit or office visit. 

## 2017-03-01 NOTE — Progress Notes (Signed)
Called to room to assist during endoscopic procedure.  Patient ID and intended procedure confirmed with present staff. Received instructions for my participation in the procedure from the performing physician.  

## 2017-03-01 NOTE — Patient Instructions (Signed)
YOU HAD AN ENDOSCOPIC PROCEDURE TODAY AT THE [facility]:   Refer to the procedure report that was given to you for any specific questions about what was found during the examination.  If the procedure report does not answer your questions, please call your gastroenterologist to clarify.  If you requested that your care partner not be given the details of your procedure findings, then the procedure report has been included in a sealed envelope for you to review at your convenience later.  YOU SHOULD EXPECT: Some feelings of bloating in the abdomen. Passage of more gas than usual.  Walking can help get rid of the air that was put into your GI tract during the procedure and reduce the bloating. If you had a lower endoscopy (such as a colonoscopy or flexible sigmoidoscopy) you may notice spotting of blood in your stool or on the toilet paper. If you underwent a bowel prep for your procedure, you may not have a normal bowel movement for a few days.  Please Note:  You might notice some irritation and congestion in your nose or some drainage.  This is from the oxygen used during your procedure.  There is no need for concern and it should clear up in a day or so.  SYMPTOMS TO REPORT IMMEDIATELY:   Following lower endoscopy (colonoscopy or flexible sigmoidoscopy):  Excessive amounts of blood in the stool  Significant tenderness or worsening of abdominal pains  Swelling of the abdomen that is new, acute  Fever of 100F or higher    For urgent or emergent issues, a gastroenterologist can be reached at any hour by calling [phone].   DIET:  We do recommend a small meal at first, but then you may proceed to your regular diet.  Drink plenty of fluids but you should avoid alcoholic beverages for 24 hours.  ACTIVITY:  You should plan to take it easy for the rest of today and you should NOT DRIVE or use heavy machinery until tomorrow (because of the sedation medicines used during the test).     FOLLOW UP: Our staff will call the number listed on your records the next business day following your procedure to check on you and address any questions or concerns that you may have regarding the information given to you following your procedure. If we do not reach you, we will leave a message.  However, if you are feeling well and you are not experiencing any problems, there is no need to return our call.  We will assume that you have returned to your regular daily activities without incident.  If any biopsies were taken you will be contacted by phone or by letter within the next 1-3 weeks.  Please call us at [phone] if you have not heard about the biopsies in 3 weeks.    SIGNATURES/CONFIDENTIALITY: You and/or your care partner have signed paperwork which will be entered into your electronic medical record.  These signatures attest to the fact that that the information above on your After Visit Summary has been reviewed and is understood.  Full responsibility of the confidentiality of this discharge information lies with you and/or your care-partner.   Resume medications. Information given on polyps and diverticulosis. 

## 2017-03-02 ENCOUNTER — Telehealth: Payer: Self-pay | Admitting: *Deleted

## 2017-03-02 NOTE — Telephone Encounter (Signed)
  Follow up Call-  Call back number 03/01/2017  Post procedure Call Back phone  # [phone]  Permission to leave phone message Yes  Some recent data might be hidden     Patient questions:  Do you have a fever, pain , or abdominal swelling? No. Pain Score  0 *  Have you tolerated food without any problems? Yes.    Have you been able to return to your normal activities? Yes.    Do you have any questions about your discharge instructions: Diet   No. Medications  No. Follow up visit  No.  Do you have questions or concerns about your Care? No.  Actions: * If pain score is 4 or above: No action needed, pain <4.

## 2017-03-02 NOTE — Telephone Encounter (Signed)
No answer, left message to call if questions or concerns. 

## 2017-03-08 ENCOUNTER — Telehealth: Payer: Self-pay | Admitting: Internal Medicine

## 2017-03-08 ENCOUNTER — Encounter: Payer: Self-pay | Admitting: Internal Medicine

## 2017-03-08 NOTE — Telephone Encounter (Signed)
Left message for pts husband to call back regarding Dr. Blanch Media questions regarding the abdominal discomfort.

## 2017-03-08 NOTE — Telephone Encounter (Signed)
Pts husband thought that pt had discussed the pain she had been having. Pt scheduled to come in and see Amy Esterwood PA tomorrow morning to discuss the abdominal discomfort she is having. Pts husband aware of appt.

## 2017-03-08 NOTE — Telephone Encounter (Signed)
Pts husband states his wife is having issues with abdominal pain every time she eats something. States the pain is above her belly button right around the rib cage area. He reports it doesn't make a difference if the food is spicy or not. Asking about possibly having un [place] of her gallbladder. Please advise.

## 2017-03-08 NOTE — Telephone Encounter (Signed)
Recently underwent colonoscopy for rectal bleeding. There were no comments about pain. How long as the pain been going on? How severe is the pain? Is this something new or something that she has had in the past?

## 2017-03-09 ENCOUNTER — Other Ambulatory Visit (INDEPENDENT_AMBULATORY_CARE_PROVIDER_SITE_OTHER): Payer: PPO

## 2017-03-09 ENCOUNTER — Encounter: Payer: Self-pay | Admitting: Physician Assistant

## 2017-03-09 ENCOUNTER — Ambulatory Visit (INDEPENDENT_AMBULATORY_CARE_PROVIDER_SITE_OTHER): Payer: PPO | Admitting: Physician Assistant

## 2017-03-09 VITALS — BP 90/50 | HR 72 | Ht 64.0 in | Wt 135.1 lb

## 2017-03-09 DIAGNOSIS — R1013 Epigastric pain: Secondary | ICD-10-CM

## 2017-03-09 DIAGNOSIS — R110 Nausea: Secondary | ICD-10-CM

## 2017-03-09 LAB — CBC WITH DIFFERENTIAL/PLATELET
BASOS ABS: 0 10*3/uL (ref 0.0–0.1)
Basophils Relative: 0.6 % (ref 0.0–3.0)
Eosinophils Absolute: 0.1 10*3/uL (ref 0.0–0.7)
Eosinophils Relative: 2.6 % (ref 0.0–5.0)
HEMATOCRIT: 38.6 % (ref 36.0–46.0)
HEMOGLOBIN: 12.8 g/dL (ref 12.0–15.0)
LYMPHS PCT: 27.9 % (ref 12.0–46.0)
Lymphs Abs: 1.5 10*3/uL (ref 0.7–4.0)
MCHC: 33.3 g/dL (ref 30.0–36.0)
MCV: 96.1 fl (ref 78.0–100.0)
MONOS PCT: 8.4 % (ref 3.0–12.0)
Monocytes Absolute: 0.5 10*3/uL (ref 0.1–1.0)
NEUTROS ABS: 3.3 10*3/uL (ref 1.4–7.7)
Neutrophils Relative %: 60.5 % (ref 43.0–77.0)
Platelets: 197 10*3/uL (ref 150.0–400.0)
RBC: 4.01 Mil/uL (ref 3.87–5.11)
RDW: 13 % (ref 11.5–15.5)
WBC: 5.4 10*3/uL (ref 4.0–10.5)

## 2017-03-09 LAB — COMPREHENSIVE METABOLIC PANEL
ALBUMIN: 4.1 g/dL (ref 3.5–5.2)
ALT: 13 U/L (ref 0–35)
AST: 22 U/L (ref 0–37)
Alkaline Phosphatase: 66 U/L (ref 39–117)
BUN: 17 mg/dL (ref 6–23)
CALCIUM: 9.5 mg/dL (ref 8.4–10.5)
CHLORIDE: 106 meq/L (ref 96–112)
CO2: 21 meq/L (ref 19–32)
Creatinine, Ser: 0.65 mg/dL (ref 0.40–1.20)
GFR: 95.4 mL/min (ref >60.00)
Glucose, Bld: 80 mg/dL (ref 70–99)
POTASSIUM: 4.6 meq/L (ref 3.5–5.1)
SODIUM: 141 meq/L (ref 135–145)
Total Bilirubin: 0.3 mg/dL (ref 0.2–1.2)
Total Protein: 6.9 g/dL (ref 6.0–8.3)

## 2017-03-09 LAB — LIPASE: LIPASE: 17 U/L (ref 11.0–59.0)

## 2017-03-09 MED ORDER — PANTOPRAZOLE SODIUM 40 MG PO TBEC: DELAYED_RELEASE_TABLET | ORAL | 3 refills | Status: DC

## 2017-03-09 NOTE — Progress Notes (Signed)
Subjective:    Patient ID: Audrey Tucker, female    DOB: 09-05-1945, 72 y.o.   MRN: [id number]  HPI Audrey Tucker is a very nice 72 year old white female, known to Dr. Henrene Pastor, who was initially referred in November 2018 for rectal bleeding. She just underwent colonoscopy on 03/01/2017 with finding of 5 polyps which were all removed, she had a few sigmoid diverticuli and internal hemorrhoids. Path on the polyps returned showing them all to be tubular adenomas and she is indicated for 5 year interval follow-up. Patient comes in today with complaints of 2 week history of upper abdominal discomfort. She describes it as a constant upper abdominal pain and aching Initial onset she described it as an attack that lasted for many hours and kept her up all night the first night. She said she had some sharp pains that might as well. She was nauseated but did not have vomiting no associated fever or chills no diarrhea. Since that time she'Tucker had milder achy-type discomfort. She did take some Pepcid which she thought may have been helpful but did not take it regularly. She'Tucker had no radiation to her back. She says she has been feeling hungry all the time with a gnawing sensation in her upper abdomen which is unusual for her. She has not noticed feeling better or worse with or without by mouth intake. She has no complaints of heartburn or indigestion no dysphagia. Bowel movements have been normal without melena or hematochezia. She'Tucker not been on any regular aspirin or NSAIDs, no recent changes in medications diet etc. Family history negative for gallbladder disease. Other medical problems include osteoarthritis migraines, restless leg syndrome.   Review of Systems Pertinent positive and negative review of systems were noted in the above HPI section.  All other review of systems was otherwise negative.  Outpatient Encounter Medications as of 03/09/2017  Medication Sig  . LORazepam (ATIVAN) 0.5 MG tablet Take 1 tablet (0.5  mg total) as needed by mouth for anxiety.  Marland Kitchen PARoxetine Mesylate 7.5 MG CAPS Take 1 tablet daily by mouth.  . pantoprazole (PROTONIX) 40 MG tablet Take 1 tablet every morning.  . SUMAtriptan (IMITREX) 50 MG tablet Take 1 tablet (50 mg total) by mouth 2 (two) times daily as needed for migraine. May repeat in 2 hours if headache persists or recurs. (Patient not taking: Reported on 03/09/2017)   No facility-administered encounter medications on file as of 03/09/2017.    No Known Allergies Patient Active Problem List   Diagnosis Date Noted  . RLS (restless legs syndrome) 12/31/2016  . Trochanteric bursitis of both hips 08/09/2016  . Generalized osteoarthritis of hand 07/27/2016  . Headache, variant migraine 07/26/2016   Social History   Socioeconomic History  . Marital status: Married    Spouse name: Not on file  . Number of children: Not on file  . Years of education: Not on file  . Highest education level: Not on file  Social Needs  . Financial resource strain: Not on file  . Food insecurity - worry: Not on file  . Food insecurity - inability: Not on file  . Transportation needs - medical: Not on file  . Transportation needs - non-medical: Not on file  Occupational History  . Not on file  Tobacco Use  . Smoking status: Former Research scientist (life sciences)  . Smokeless tobacco: Never Used  Substance and Sexual Activity  . Alcohol use: Yes    Comment: social  . Drug use: No  . Sexual activity: Not  on file  Other Topics Concern  . Not on file  Social History Narrative  . Not on file    Audrey Tucker'Tucker family history includes Arthritis in her father and mother; Cancer in her father; Hypertension in her mother.      Objective:    Vitals:   03/09/17 0829  BP: (!) 90/50  Pulse: 72    Physical Exam; well-developed older white female in no acute distress, very pleasant blood pressure 90/50 pulse 72, height 5 foot 4, weight 135, BMI of 23.1. HEENT; nontraumatic normocephalic EOMI PERRLA sclera  anicteric, Cardiovascular ;regular rate and rhythm with S1-S2 no murmur or gallop, Pulmonary; clear bilaterally, Abdomen; soft, she has some mild tenderness in the epigastrium there is no guarding or rebound no palpable mass or hepatosplenomegaly bowel sounds are present, Rectal; exam not done, Ext;no clubbing cyanosis or edema skin warm and dry, Neuropsych ;mood and affect appropriate       Assessment & Plan:   #31 72 year old female with 2-3 week history of epigastric abdominal pain, initially severe in onset and now with milder more constant achy-type discomfort with occasional sharp pains. Rule out gastritis, peptic ulcer disease or possible gallbladder disease  #2; history of multiple adenomatous colon polyps-recent colonoscopy January 2019, due for follow-up 2024 #3 diverticulosis #4restless leg syndrome #5 osteoarthritis  Plan; CBC with differential, CMET, lipase Schedule for upper abdominal ultrasound Start pantoprazole 40 mg by mouth every morning 1 month I discussed possible indication for EGD with Dr. Henrene Pastor if ultrasound is unrevealing and symptoms persist on Protonix   Audrey Tucker Audrey Varin PA-C 03/09/2017   Cc: Martinique, Betty G, MD

## 2017-03-09 NOTE — Patient Instructions (Addendum)
Please go to the basement level to have your labs drawn.  We sent a prescription for Pantoprazole Sodium 40 mg to your pharmacy.  You have been scheduled for an abdominal ultrasound at [facility] (1st floor of hospital) on MOnday 03-14-17  at 9:30 am. Please arrive at 9:15 am to your appointment for registration. Make certain not to have anything to eat or drink 6 hours prior to your appointment. Should you need to reschedule your appointment, please contact radiology at [phone]. This test typically takes about 30 minutes to perform.  If you are age 10 or older, your body mass index should be between 23-30. Your Body mass index is 23.19 kg/m. If this is out of the aforementioned range listed, please consider follow up with your Primary Care Provider.

## 2017-03-10 NOTE — Progress Notes (Signed)
Assessment and plans reviewed  

## 2017-03-11 LAB — H PYLORI, IGM, IGG, IGA AB: H pylori, IgM Abs: <9 units (ref 0.0–8.9)

## 2017-03-14 ENCOUNTER — Ambulatory Visit (HOSPITAL_COMMUNITY)
Admission: RE | Admit: 2017-03-14 | Discharge: 2017-03-14 | Disposition: A | Discharge status: Home / Self Care | Payer: PPO | Source: Ambulatory Visit | Attending: Physician Assistant | Admitting: Physician Assistant

## 2017-03-14 ENCOUNTER — Ambulatory Visit (HOSPITAL_COMMUNITY): Payer: PPO

## 2017-03-14 DIAGNOSIS — R110 Nausea: Secondary | ICD-10-CM | POA: Diagnosis not present

## 2017-03-14 DIAGNOSIS — K7689 Other specified diseases of liver: Secondary | ICD-10-CM | POA: Diagnosis not present

## 2017-03-14 DIAGNOSIS — R1013 Epigastric pain: Secondary | ICD-10-CM | POA: Diagnosis not present

## 2017-03-15 ENCOUNTER — Telehealth: Payer: Self-pay

## 2017-03-15 NOTE — Telephone Encounter (Signed)
She is pleased with her results. She reports her abdominal discomfort has become "bearable" but it is persistent. She has been reading online. She is concerned about ovarian cancer. No specific reason other than she has read the symptoms are non-existant to very vague "until it is too late."

## 2017-03-23 ENCOUNTER — Encounter (INDEPENDENT_AMBULATORY_CARE_PROVIDER_SITE_OTHER): Payer: Self-pay | Admitting: Orthopaedic Surgery

## 2017-03-23 ENCOUNTER — Ambulatory Visit (INDEPENDENT_AMBULATORY_CARE_PROVIDER_SITE_OTHER): Payer: PPO | Admitting: Orthopaedic Surgery

## 2017-03-23 DIAGNOSIS — M7061 Trochanteric bursitis, right hip: Secondary | ICD-10-CM

## 2017-03-23 DIAGNOSIS — M7062 Trochanteric bursitis, left hip: Secondary | ICD-10-CM | POA: Diagnosis not present

## 2017-03-23 MED ORDER — METHYLPREDNISOLONE ACETATE 40 MG/ML IJ SUSP
40.0000 mg | INTRAMUSCULAR | Status: AC | PRN
  Administered 2017-03-23: 40 mg via INTRA_ARTICULAR

## 2017-03-23 MED ORDER — LIDOCAINE HCL 1 % IJ SOLN
0.5000 mL | INTRAMUSCULAR | Status: AC | PRN
  Administered 2017-03-23: .5 mL

## 2017-03-23 MED ORDER — METHOCARBAMOL 500 MG PO TABS: 500.0000 mg | ORAL_TABLET | Freq: Three times a day (TID) | ORAL | 1 refills | Status: DC

## 2017-03-23 NOTE — Progress Notes (Signed)
   Procedure Note  Patient: Audrey Tucker             Date of Birth: Oct 30, 1945           MRN: [id number]             Visit Date: 03/23/2017  HPI: Audrey Tucker returns today due to bilateral hip pain.  She states the injections back in June lasted 4 months.  Pain is returning slowly.  She denies any numbness tingling down either hip.  Right hip is worse than the left.  She continues to do stretching exercises.  Physical exam: Bilateral hips no rashes skin lesions ulceration over either hip.  She has tenderness over the trochanteric region of both hips.  Good range of motion of both hips without pain . Procedures: Visit Diagnoses: Trochanteric bursitis of both hips  Large Joint Inj: bilateral greater trochanter on 03/23/2017 8:57 AM Indications: pain Details: 22 G 1.5 in needle, lateral approach  Arthrogram: No  Medications (Right): 0.5 mL lidocaine 1 %; 40 mg methylPREDNISolone acetate 40 MG/ML Medications (Left): 0.5 mL lidocaine 1 %; 40 mg methylPREDNISolone acetate 40 MG/ML Outcome: tolerated well, no immediate complications Procedure, treatment alternatives, risks and benefits explained, specific risks discussed. Consent was given by the patient. Immediately prior to procedure a time out was called to verify the correct patient, procedure, equipment, support staff and site/side marked as required. Patient was prepped and draped in the usual sterile fashion.     Plan: She will continue to work on stretching.  Did give her a refill on the Robaxin as she feels this is helpful whenever her pain becomes severe in both hips.  Follow-up on an as-needed basis.

## 2017-03-25 ENCOUNTER — Telehealth (INDEPENDENT_AMBULATORY_CARE_PROVIDER_SITE_OTHER): Payer: Self-pay

## 2017-03-25 ENCOUNTER — Other Ambulatory Visit (INDEPENDENT_AMBULATORY_CARE_PROVIDER_SITE_OTHER): Payer: Self-pay

## 2017-03-25 MED ORDER — TIZANIDINE HCL 4 MG PO TABS: 4.0000 mg | ORAL_TABLET | Freq: Four times a day (QID) | ORAL | 0 refills | Status: DC | PRN

## 2017-03-25 NOTE — Telephone Encounter (Signed)
Zanaflex 4 mg, 1 every 8-12 hours as needed. #60.

## 2017-03-25 NOTE — Telephone Encounter (Signed)
Faxed to pharmacy

## 2017-03-25 NOTE — Telephone Encounter (Signed)
Will you please call her in a different muscle relaxer, her insurance will not cover the Robaxin, thanks

## 2017-03-29 ENCOUNTER — Other Ambulatory Visit (INDEPENDENT_AMBULATORY_CARE_PROVIDER_SITE_OTHER): Payer: Self-pay

## 2017-03-31 ENCOUNTER — Ambulatory Visit (INDEPENDENT_AMBULATORY_CARE_PROVIDER_SITE_OTHER): Payer: PPO | Admitting: Internal Medicine

## 2017-03-31 ENCOUNTER — Encounter: Payer: Self-pay | Admitting: Internal Medicine

## 2017-03-31 VITALS — BP 94/54 | HR 62 | Ht 64.0 in | Wt 129.0 lb

## 2017-03-31 DIAGNOSIS — R1013 Epigastric pain: Secondary | ICD-10-CM | POA: Diagnosis not present

## 2017-03-31 DIAGNOSIS — R1310 Dysphagia, unspecified: Secondary | ICD-10-CM | POA: Diagnosis not present

## 2017-03-31 DIAGNOSIS — R079 Chest pain, unspecified: Secondary | ICD-10-CM

## 2017-03-31 DIAGNOSIS — R0789 Other chest pain: Secondary | ICD-10-CM

## 2017-03-31 NOTE — Patient Instructions (Signed)
You have been scheduled for an endoscopy. Please follow written instructions given to you at your visit today. If you use inhalers (even only as needed), please bring them with you on the day of your procedure. Your physician has requested that you go to www.startemmi.com and enter the access code given to you at your visit today. This web site gives a general overview about your procedure. However, you should still follow specific instructions given to you by our office regarding your preparation for the procedure.   Stay on your PPI

## 2017-03-31 NOTE — Progress Notes (Signed)
HISTORY OF PRESENT ILLNESS:  Audrey Tucker is a 72 y.o. female who I saw 03/01/2017 for colonoscopy to evaluate rectal bleeding. Examination revealed 5 diminutive polyps, mild diverticulosis, and internal hemorrhoids. Follow-up in 5 years recommended. The patient was subsequently evaluated in the office by the GI physician assistant for new complaints of upper abdominal and lower sternal discomfort. The evaluation occur 03/09/2017. CBC, comprehensive metabolic panel, and lipase were unremarkable. Abdominal ultrasound was negative for acute abnormalities. Normal gallbladder. She was started on pantoprazole 40 mg daily and told to follow up with me at this time. The patient reports that despite PPI she has had no improvement in her symptoms. She describes sharp substernal chest pain was slight radiation to the right which occurs principally at night. May last 30 minutes. Also associated aching in this region. She states she had a similar problem 10 years ago which was treated with an unspecified medication and resolved after a few weeks. She also tells me that she is undergone negative cardiac evaluation for these complaints. She denies heartburn or indigestion. She does tell me that she has had intermittent pill and solid food dysphagia. She has not had prior upper endoscopy. She tells me that she is experiencing her lower chest discomfort at this time.  REVIEW OF SYSTEMS:  All non-GI ROS negative except for night sweats and arthritis  Past Medical History:  Diagnosis Date  . Adenomatous colon polyp    tubular  . Arthritis   . Chicken pox   . Diverticulosis   . Frequent headaches   . PONV (postoperative nausea and vomiting)   . Seasonal allergies     Past Surgical History:  Procedure Laterality Date  . FOOT NEUROMA SURGERY Right 04-29-15   Dr Paulla Dolly  . SHOULDER ARTHROSCOPY Left     Social History Audrey Tucker  reports that she has quit smoking. she has never used smokeless tobacco.  She reports that she drinks alcohol. She reports that she does not use drugs.  family history includes Arthritis in her father and mother; Cancer in her father; Hypertension in her mother.  No Known Allergies     PHYSICAL EXAMINATION: Vital signs: BP (!) 94/54   Pulse 62   Ht 5\' 4"  (1.626 m)   Wt 129 lb (58.5 kg)   BMI 22.14 kg/m   Constitutional: generally well-appearing, no acute distress Psychiatric: alert and oriented x3, cooperative Eyes: extraocular movements intact, anicteric, conjunctiva pink Mouth: oral pharynx moist, no lesions Neck: supple no lymphadenopathy Chest: There is tenderness on the lower sternum and lower costochondral area just right of the sternum with palpation. She confirms that this reproduces her discomfort Cardiovascular: heart regular rate and rhythm, no murmur Lungs: clear to auscultation bilaterally Abdomen: soft, nontender, nondistended, no obvious ascites, no peritoneal signs, normal bowel sounds, no organomegaly Rectal: Omitted Extremities: no clubbing, cyanosis, or lower extremity edema bilaterally Skin: no lesions on visible extremities Neuro: No focal deficits. Cranial nerves intact  ASSESSMENT:  #1. Musculoskeletal chest wall pain #2. Intermittent solid food dysphagia. Rule out stricture #3. Multiple adenomatous colon polyps on recent colonoscopy   PLAN:  #1. Continue PPI for now #2. Schedule upper endoscopy with possible esophageal dilation to evaluate dysphagia and complaints of chest/epigastric pain.The nature of the procedure, as well as the risks, benefits, and alternatives were carefully and thoroughly reviewed with the patient. Ample time for discussion and questions allowed. The patient understood, was satisfied, and agreed to proceed. #3. Reassurance and symptomatic therapies for musculoskeletal  chest wall pain #4. Surveillance colonoscopy around January 2024

## 2017-04-04 ENCOUNTER — Ambulatory Visit (AMBULATORY_SURGERY_CENTER): Payer: PPO | Admitting: Internal Medicine

## 2017-04-04 ENCOUNTER — Other Ambulatory Visit: Payer: Self-pay

## 2017-04-04 ENCOUNTER — Encounter: Payer: Self-pay | Admitting: Internal Medicine

## 2017-04-04 VITALS — BP 113/63 | HR 61 | Temp 98.7°F | Resp 14 | Ht 64.0 in | Wt 129.0 lb

## 2017-04-04 DIAGNOSIS — R1310 Dysphagia, unspecified: Secondary | ICD-10-CM

## 2017-04-04 DIAGNOSIS — R079 Chest pain, unspecified: Secondary | ICD-10-CM

## 2017-04-04 DIAGNOSIS — R1013 Epigastric pain: Secondary | ICD-10-CM | POA: Diagnosis not present

## 2017-04-04 DIAGNOSIS — K219 Gastro-esophageal reflux disease without esophagitis: Secondary | ICD-10-CM | POA: Diagnosis not present

## 2017-04-04 DIAGNOSIS — K2980 Duodenitis without bleeding: Secondary | ICD-10-CM | POA: Diagnosis not present

## 2017-04-04 DIAGNOSIS — K222 Esophageal obstruction: Secondary | ICD-10-CM

## 2017-04-04 MED ORDER — SODIUM CHLORIDE 0.9 % IV SOLN: 500.0000 mL | Freq: Once | INTRAVENOUS | Status: DC

## 2017-04-04 NOTE — Progress Notes (Signed)
No problems noted in the recovery room. maw 

## 2017-04-04 NOTE — Patient Instructions (Signed)
YOU HAD AN ENDOSCOPIC PROCEDURE TODAY AT [facility]:   Refer to the procedure report that was given to you for any specific questions about what was found during the examination.  If the procedure report does not answer your questions, please call your gastroenterologist to clarify.  If you requested that your care partner not be given the details of your procedure findings, then the procedure report has been included in a sealed envelope for you to review at your convenience later.  YOU SHOULD EXPECT: Some feelings of bloating in the abdomen. Passage of more gas than usual.  Walking can help get rid of the air that was put into your GI tract during the procedure and reduce the bloating. If you had a lower endoscopy (such as a colonoscopy or flexible sigmoidoscopy) you may notice spotting of blood in your stool or on the toilet paper. If you underwent a bowel prep for your procedure, you may not have a normal bowel movement for a few days.  Please Note:  You might notice some irritation and congestion in your nose or some drainage.  This is from the oxygen used during your procedure.  There is no need for concern and it should clear up in a day or so.  SYMPTOMS TO REPORT IMMEDIATELY:    Following upper endoscopy (EGD)  Vomiting of blood or coffee ground material  New chest pain or pain under the shoulder blades  Painful or persistently difficult swallowing  New shortness of breath  Fever of 100F or higher  Black, tarry-looking stools  For urgent or emergent issues, a gastroenterologist can be reached at any hour by calling [phone].   DIET:  Please follow the dilatation diet the rest of today.  Handout was given to your husband.  Drink plenty of fluids but you should avoid alcoholic beverages for 24 hours.  ACTIVITY:  You should plan to take it easy for the rest of today and you should NOT DRIVE or use heavy machinery until tomorrow (because of the sedation medicines  used during the test).    FOLLOW UP: Our staff will call the number listed on your records the next business day following your procedure to check on you and address any questions or concerns that you may have regarding the information given to you following your procedure. If we do not reach you, we will leave a message.  However, if you are feeling well and you are not experiencing any problems, there is no need to return our call.  We will assume that you have returned to your regular daily activities without incident.  If any biopsies were taken you will be contacted by phone or by letter within the next 1-3 weeks.  Please call us at [phone] if you have not heard about the biopsies in 3 weeks.    SIGNATURES/CONFIDENTIALITY: You and/or your care partner have signed paperwork which will be entered into your electronic medical record.  These signatures attest to the fact that that the information above on your After Visit Summary has been reviewed and is understood.  Full responsibility of the confidentiality of this discharge information lies with you and/or your care-partner.    Handout was given to your care partner on the dilatation diet to follow the rest of today. Continue Pantoprazole until current prescription is complete, then stop. You may resume your current medications today. Await biopsy results. Per Dr. Henrene Pastor pain is felt to be musculoskeletal. Please call if any  questions or concerns.

## 2017-04-04 NOTE — Progress Notes (Signed)
Report to PACU, RN, vss, BBS= Clear.  

## 2017-04-04 NOTE — Progress Notes (Signed)
Called to room to assist during endoscopic procedure.  Patient ID and intended procedure confirmed with present staff. Received instructions for my participation in the procedure from the performing physician.  

## 2017-04-04 NOTE — Op Note (Signed)
[facility] Patient Name: Audrey Tucker Procedure Date: 04/04/2017 2:49 PM MRN: [id number] Endoscopist: Docia Chuck. Henrene Pastor , MD Age: 72 Referring MD:  Date of Birth: 1945-05-20 Gender: Female Account #: [id number] Procedure:                Upper GI endoscopy, with biopsy; with [facility]                            dilation of the esophagus-55f Indications:              Dysphagia, Chest pain (non cardiac) Medicines:                Monitored Anesthesia Care Procedure:                Pre-Anesthesia Assessment:                           - Prior to the procedure, a History and Physical                            was performed, and patient medications and                            allergies were reviewed. The patient's tolerance of                            previous anesthesia was also reviewed. The risks                            and benefits of the procedure and the sedation                            options and risks were discussed with the patient.                            All questions were answered, and informed consent                            was obtained. Prior Anticoagulants: The patient has                            taken no previous anticoagulant or antiplatelet                            agents. ASA Grade Assessment: II - A patient with                            mild systemic disease. After reviewing the risks                            and benefits, the patient was deemed in                            satisfactory condition to undergo the procedure.  After obtaining informed consent, the endoscope was                            passed under direct vision. Throughout the                            procedure, the patient's blood pressure, pulse, and                            oxygen saturations were monitored continuously. The                            Endoscope was introduced through the mouth, and                            advanced to the  second part of duodenum. The upper                            GI endoscopy was accomplished without difficulty.                            The patient tolerated the procedure well. Scope In: Scope Out: Findings:                 One mild benign-appearing, intrinsic stenosis was                            found. The scope was withdrawn after completing the                            endoscopic survey. Dilation was performed with a                            Maloney dilator with no resistance at 52 Fr. No                            heme. Tolerated well.                           The exam of the esophagus was otherwise normal.                           The stomach was normal.                           Patchy mildly erythematous mucosa was found in the                            duodenal bulb. Biopsies were taken with a cold                            forceps for Helicobacter pylori testing using                            CLOtest.  The examined duodenum was normal.                           The cardia and gastric fundus were normal on                            retroflexion. Complications:            No immediate complications. Estimated Blood Loss:     Estimated blood loss: none. Impression:               1. Mild esophageal stricture status post dilation                           2. Mild duodenitis status post CLO biopsy.                           3. Otherwise normal exam                           4. Pain felt to be musculoskeletal Recommendation:           1. Continue pantoprazole until current prescription                            complete, then stop.                           2. Post-dilation diet                           3. Follow-up CLO test.                           4. Resume general medical care with Dr. Martinique Lehi Phifer N. Henrene Pastor, MD 04/04/2017 3:12:21 PM This report has been signed electronically.

## 2017-04-04 NOTE — Progress Notes (Signed)
Pt. Reports no change in her medical or surgical history since her pre-visit 03/31/17.

## 2017-04-05 ENCOUNTER — Telehealth: Payer: Self-pay

## 2017-04-05 LAB — HELICOBACTER PYLORI SCREEN-BIOPSY: UREASE: NEGATIVE

## 2017-04-05 NOTE — Addendum Note (Signed)
Addended by: Bea Laura on: 04/05/2017 02:39 PM   Modules accepted: Orders

## 2017-04-05 NOTE — Telephone Encounter (Signed)
  Follow up Call-  Call back number 04/04/2017 03/01/2017  Post procedure Call Back phone  # [phone]  Permission to leave phone message Yes Yes  Some recent data might be hidden     Patient questions:  Do you have a fever, pain , or abdominal swelling? No. Pain Score  0 *  Have you tolerated food without any problems? Yes.    Have you been able to return to your normal activities? Yes.    Do you have any questions about your discharge instructions: Diet   No. Medications  No. Follow up visit  No.  Do you have questions or concerns about your Care? No.  Actions: * If pain score is 4 or above: No action needed, pain <4.

## 2017-04-08 DIAGNOSIS — H524 Presbyopia: Secondary | ICD-10-CM | POA: Diagnosis not present

## 2017-07-05 ENCOUNTER — Ambulatory Visit (INDEPENDENT_AMBULATORY_CARE_PROVIDER_SITE_OTHER): Payer: PPO | Admitting: Orthopaedic Surgery

## 2017-07-05 ENCOUNTER — Encounter (INDEPENDENT_AMBULATORY_CARE_PROVIDER_SITE_OTHER): Payer: Self-pay | Admitting: Orthopaedic Surgery

## 2017-07-05 DIAGNOSIS — M7061 Trochanteric bursitis, right hip: Secondary | ICD-10-CM

## 2017-07-05 DIAGNOSIS — M6281 Muscle weakness (generalized): Secondary | ICD-10-CM | POA: Diagnosis not present

## 2017-07-05 DIAGNOSIS — M7062 Trochanteric bursitis, left hip: Secondary | ICD-10-CM | POA: Diagnosis not present

## 2017-07-05 DIAGNOSIS — M25551 Pain in right hip: Secondary | ICD-10-CM | POA: Diagnosis not present

## 2017-07-05 DIAGNOSIS — M25552 Pain in left hip: Secondary | ICD-10-CM | POA: Diagnosis not present

## 2017-07-05 DIAGNOSIS — R262 Difficulty in walking, not elsewhere classified: Secondary | ICD-10-CM | POA: Diagnosis not present

## 2017-07-05 MED ORDER — DICLOFENAC SODIUM 1 % TD GEL: 2.0000 g | Freq: Four times a day (QID) | TRANSDERMAL | 3 refills | Status: DC

## 2017-07-05 NOTE — Progress Notes (Signed)
The patient is well-known to [place].  She has bilateral hip trochanteric bursitis that we have seen her for for a long period time.  She is a very young appearing 72 year old female who is active but has not been over exercising.  Her pain is been consistent over the trochanteric area on both hips with the right worse than left.  We have x-rayed her hips before showing no significant arthritic changes.  She does still deny groin pain.  On exam I can put both hips easily through full internal extra rotation as well as flexion extension with no pain in the groin at all in both hips moves fluidly smoothly.  Her pain is only palpation of the trochanteric area bilaterally.  At this point she has not tried formal physical therapy.  She said the steroid injections have not helped so at this point I will send her to [facility] physical therapy to work on any modalities that can help this will better.  I will see her back in 6 weeks to see how she is doing overall.  I will try sending in Voltaren gel as well.  At the 6-week visit I told her we may still try steroid injections again to help augment the therapy.  All questions concerns were answered and addressed.

## 2017-07-12 DIAGNOSIS — R262 Difficulty in walking, not elsewhere classified: Secondary | ICD-10-CM | POA: Diagnosis not present

## 2017-07-12 DIAGNOSIS — M6281 Muscle weakness (generalized): Secondary | ICD-10-CM | POA: Diagnosis not present

## 2017-07-12 DIAGNOSIS — M25551 Pain in right hip: Secondary | ICD-10-CM | POA: Diagnosis not present

## 2017-07-12 DIAGNOSIS — M25552 Pain in left hip: Secondary | ICD-10-CM | POA: Diagnosis not present

## 2017-07-20 DIAGNOSIS — M6281 Muscle weakness (generalized): Secondary | ICD-10-CM | POA: Diagnosis not present

## 2017-07-20 DIAGNOSIS — M25552 Pain in left hip: Secondary | ICD-10-CM | POA: Diagnosis not present

## 2017-07-20 DIAGNOSIS — R262 Difficulty in walking, not elsewhere classified: Secondary | ICD-10-CM | POA: Diagnosis not present

## 2017-07-20 DIAGNOSIS — M25551 Pain in right hip: Secondary | ICD-10-CM | POA: Diagnosis not present

## 2017-07-27 DIAGNOSIS — M25551 Pain in right hip: Secondary | ICD-10-CM | POA: Diagnosis not present

## 2017-07-27 DIAGNOSIS — M25552 Pain in left hip: Secondary | ICD-10-CM | POA: Diagnosis not present

## 2017-07-27 DIAGNOSIS — R262 Difficulty in walking, not elsewhere classified: Secondary | ICD-10-CM | POA: Diagnosis not present

## 2017-07-27 DIAGNOSIS — M6281 Muscle weakness (generalized): Secondary | ICD-10-CM | POA: Diagnosis not present

## 2017-08-03 DIAGNOSIS — S39012A Strain of muscle, fascia and tendon of lower back, initial encounter: Secondary | ICD-10-CM | POA: Diagnosis not present

## 2017-08-03 DIAGNOSIS — M6281 Muscle weakness (generalized): Secondary | ICD-10-CM | POA: Diagnosis not present

## 2017-08-03 DIAGNOSIS — M9902 Segmental and somatic dysfunction of thoracic region: Secondary | ICD-10-CM | POA: Diagnosis not present

## 2017-08-03 DIAGNOSIS — M47817 Spondylosis without myelopathy or radiculopathy, lumbosacral region: Secondary | ICD-10-CM | POA: Diagnosis not present

## 2017-08-03 DIAGNOSIS — M9905 Segmental and somatic dysfunction of pelvic region: Secondary | ICD-10-CM | POA: Diagnosis not present

## 2017-08-03 DIAGNOSIS — M9903 Segmental and somatic dysfunction of lumbar region: Secondary | ICD-10-CM | POA: Diagnosis not present

## 2017-08-03 DIAGNOSIS — S29012A Strain of muscle and tendon of back wall of thorax, initial encounter: Secondary | ICD-10-CM | POA: Diagnosis not present

## 2017-08-03 DIAGNOSIS — M25552 Pain in left hip: Secondary | ICD-10-CM | POA: Diagnosis not present

## 2017-08-03 DIAGNOSIS — R262 Difficulty in walking, not elsewhere classified: Secondary | ICD-10-CM | POA: Diagnosis not present

## 2017-08-03 DIAGNOSIS — M25551 Pain in right hip: Secondary | ICD-10-CM | POA: Diagnosis not present

## 2017-08-09 DIAGNOSIS — R262 Difficulty in walking, not elsewhere classified: Secondary | ICD-10-CM | POA: Diagnosis not present

## 2017-08-09 DIAGNOSIS — M6281 Muscle weakness (generalized): Secondary | ICD-10-CM | POA: Diagnosis not present

## 2017-08-09 DIAGNOSIS — M25551 Pain in right hip: Secondary | ICD-10-CM | POA: Diagnosis not present

## 2017-08-09 DIAGNOSIS — M25552 Pain in left hip: Secondary | ICD-10-CM | POA: Diagnosis not present

## 2017-08-24 ENCOUNTER — Ambulatory Visit (INDEPENDENT_AMBULATORY_CARE_PROVIDER_SITE_OTHER): Payer: PPO | Admitting: Orthopaedic Surgery

## 2017-08-24 ENCOUNTER — Encounter (INDEPENDENT_AMBULATORY_CARE_PROVIDER_SITE_OTHER): Payer: Self-pay | Admitting: Orthopaedic Surgery

## 2017-08-24 DIAGNOSIS — M7061 Trochanteric bursitis, right hip: Secondary | ICD-10-CM

## 2017-08-24 DIAGNOSIS — M7062 Trochanteric bursitis, left hip: Secondary | ICD-10-CM | POA: Diagnosis not present

## 2017-08-24 NOTE — Progress Notes (Signed)
HPI:  Audrey Tucker comes in today for follow-up of her bilateral trochanteric bursitis.  She is been going to physical therapy feels that left hip is improved tremendously.  She has minimal discomfort over the left trochanteric region.  Still has pain on the right side.  But this is also improved.  She denies any radicular symptoms down either leg.  She is doing stretching home exercise program at therapy they are doing dry needling.  She states if she is walking for too long or has sat for long period of time and then goes to walk that she has some discomfort in both hips.  She has been using Voltaren gel on both hips 4 times daily as she is unsure if this helps but she does need a refill.  Physical exam: General well-developed well-nourished female in no acute distress mood and affect appropriate. Bilateral hips: Good range of motion of both hips without pain.  She has slight tenderness over the trochanteric region of both hips.  Ambulates with a nonantalgic gait and without any assistive device.  Impression: Bilateral trochanteric bursitis  Plan: She will continue stretching and continued therapy she will discharge to home exercise program.  She will follow with [place] on an as-needed basis or if pain becomes worse or persist.  She may benefit from another trochanteric injection in the future.  Questions encouraged and answered at length.

## 2017-11-02 DIAGNOSIS — Z1231 Encounter for screening mammogram for malignant neoplasm of breast: Secondary | ICD-10-CM | POA: Diagnosis not present

## 2017-11-02 DIAGNOSIS — Z01419 Encounter for gynecological examination (general) (routine) without abnormal findings: Secondary | ICD-10-CM | POA: Diagnosis not present

## 2017-11-02 DIAGNOSIS — Z6820 Body mass index (BMI) 20.0-20.9, adult: Secondary | ICD-10-CM | POA: Diagnosis not present

## 2017-11-30 ENCOUNTER — Telehealth: Payer: Self-pay | Admitting: *Deleted

## 2017-11-30 NOTE — Telephone Encounter (Signed)
Copied from Rosa Sanchez [id number]. Topic: Medicare AWV >> Nov 30, 2017  2:18 PM Reyne Dumas L wrote: Reason for CRM:   Pt calling to schedule AWV.  Pt can be reached at [phone]

## 2017-12-06 NOTE — Progress Notes (Signed)
Subjective:   Audrey Tucker is a 72 y.o. female who presents for Medicare Annual (Subsequent) preventive examination.  Reports health as very good  Last ov 12/31/2016 Went to someone else  Lives in home  Would like to scale down in time [place] work is overwhelming at times    Diet Nutritious  Make quiches; spinach, egg beaters  [employer] vegetables; scallops, other     Exercise Works out in the yard Does not stop and stays busy Fit measured 7 miles when cleaning   Former smoker  Health Maintenance Due  Topic Date Due  . Hepatitis C Screening  01-19-1946   Will discuss hep C with Dr. Martinique but was educated  Mammogram 11/2013 - updated by GYN annually  Dexa followed by GYN Dr. Lynnette Caffey    Colonoscopy 02/2017  Educate n shingrix  Declines IMM at this time; all were postponed        Objective:     Vitals: BP 100/60   Pulse 63   Ht 5\' 4"  (1.626 m)   Wt 123 lb (55.8 kg)   SpO2 98%   BMI 21.11 kg/m   Body mass index is 21.11 kg/m.  Advanced Directives 12/07/2017 05/16/2015  Does Patient Have a Medical Advance Directive? Yes Yes  Type of Advance Directive - Healthcare Power of Attorney    Tobacco Social History   Tobacco Use  Smoking Status Former Smoker  . Last attempt to quit: 04/05/1967  . Years since quitting: 50.7  Smokeless Tobacco Never Used  Tobacco Comment   rare use in college      Counseling given: Yes Comment: rare use in college    Clinical Intake:     Past Medical History:  Diagnosis Date  . Adenomatous colon polyp    tubular  . Arthritis   . Chicken pox   . Diverticulosis   . Frequent headaches   . PONV (postoperative nausea and vomiting)   . Seasonal allergies    Past Surgical History:  Procedure Laterality Date  . FOOT NEUROMA SURGERY Right 04-29-15   Dr Paulla Dolly  . SHOULDER ARTHROSCOPY Left    Family History  Problem Relation Age of Onset  . Arthritis Mother   . Hypertension Mother   . Arthritis Father   .  Cancer Father        lung   Social History   Socioeconomic History  . Marital status: Married    Spouse name: Not on file  . Number of children: Not on file  . Years of education: Not on file  . Highest education level: Not on file  Occupational History  . Not on file  Social Needs  . Financial resource strain: Not on file  . Food insecurity:    Worry: Not on file    Inability: Not on file  . Transportation needs:    Medical: Not on file    Non-medical: Not on file  Tobacco Use  . Smoking status: Former Smoker    Last attempt to quit: 04/05/1967    Years since quitting: 50.7  . Smokeless tobacco: Never Used  . Tobacco comment: rare use in college   Substance and Sexual Activity  . Alcohol use: Yes    Comment: social  . Drug use: No  . Sexual activity: Not on file  Lifestyle  . Physical activity:    Days per week: Not on file    Minutes per session: Not on file  . Stress: Not on file  Relationships  . Social connections:    Talks on phone: Not on file    Gets together: Not on file    Attends religious service: Not on file    Active member of club or organization: Not on file    Attends meetings of clubs or organizations: Not on file    Relationship status: Not on file  Other Topics Concern  . Not on file  Social History Narrative  . Not on file    Outpatient Encounter Medications as of 12/07/2017  Medication Sig  . diclofenac sodium (VOLTAREN) 1 % GEL Apply 2 g topically 4 (four) times daily.  Marland Kitchen LORazepam (ATIVAN) 0.5 MG tablet Take 1 tablet (0.5 mg total) as needed by mouth for anxiety.  . pantoprazole (PROTONIX) 40 MG tablet Take 1 tablet every morning.  Marland Kitchen PARoxetine Mesylate 7.5 MG CAPS Take 1 tablet daily by mouth.  . methocarbamol (ROBAXIN) 500 MG tablet Take 1 tablet (500 mg total) by mouth 3 (three) times daily. (Patient not taking: Reported on 12/07/2017)   Facility-Administered Encounter Medications as of 12/07/2017  Medication  . 0.9 %  sodium  chloride infusion    Activities of Daily Living In your present state of health, do you have any difficulty performing the following activities: 12/07/2017  Hearing? N  Vision? N  Difficulty concentrating or making decisions? N  Walking or climbing stairs? N  Dressing or bathing? N  Doing errands, shopping? N  Preparing Food and eating ? N  Using the Toilet? N  In the past six months, have you accidently leaked urine? Y  Comment just had GYN; urgency   Do you have problems with loss of bowel control? N  Managing your Medications? N  Managing your Finances? N  Housekeeping or managing your Housekeeping? N  Some recent data might be hidden    Patient Care Team: Martinique, Betty G, MD as PCP - General (Family Medicine)  Dr. Argentina Hedges - mammogram August in MD office Bone density is followed by Dr. Lynnette Caffey as well     Assessment:   This is a routine wellness examination for [place].  Exercise Activities and Dietary recommendations Current Exercise Habits: Home exercise routine  Goals    . Exercise 150 min/wk Moderate Activity     Uses a fit bit!        Fall Risk Fall Risk  12/07/2017  Falls in the past year? No     Depression Screen PHQ 2/9 Scores 12/07/2017  PHQ - 2 Score 0     Cognitive Function     Ad8 score reviewed for issues:  Issues making decisions:  Less interest in hobbies / activities:  Repeats questions, stories (family complaining):  Trouble using ordinary gadgets (microwave, computer, phone):  Forgets the month or year:   Mismanaging finances:   Remembering appts:  Daily problems with thinking and/or memory: Ad8 score is=0        Immunization History  Administered Date(s) Administered  . Influenza Whole 12/14/2006  . Influenza-Unspecified 11/29/2013  . Tdap 02/15/2014     Screening Tests Health Maintenance  Topic Date Due  . Hepatitis C Screening  [birth year]  . PNA vac Low Risk Adult (1 of 2 - PCV13) 12/16/2027 (Originally  10/23/2010)  . INFLUENZA VACCINE  12/15/2028 (Originally 09/15/2017)  . MAMMOGRAM  09/16/2019  . COLONOSCOPY  03/01/2022  . TETANUS/TDAP  02/16/2024  . DEXA SCAN  Completed         Plan:  PCP Notes   Health Maintenance Will discuss hep C with Dr. Martinique but was educated  Mammogram 11/2013 - updated by GYN annually  Dexa followed by GYN Dr. Lynnette Caffey    Colonoscopy 02/2017  Educate n shingrix  Declines IMM at this time; all were postponed    Abnormal Screens  tinnitus but has been followed   Referrals  none  Patient concerns; Does not like to take vaccines;  Given resource for cdc   Nurse Concerns; None; stays busy, fit bit noted 7 miles cleaning the home  Next PCP apt Late November and will schedule today       I have personally reviewed and noted the following in the patient's chart:   . Medical and social history . Use of alcohol, tobacco or illicit drugs  . Current medications and supplements . Functional ability and status . Nutritional status . Physical activity . Advanced directives . List of other physicians . Hospitalizations, surgeries, and ER visits in previous 12 months . Vitals . Screenings to include cognitive, depression, and falls . Referrals and appointments  In addition, I have reviewed and discussed with patient certain preventive protocols, quality metrics, and best practice recommendations. A written personalized care plan for preventive services as well as general preventive health recommendations were provided to patient.     Wynetta Fines, RN  12/07/2017

## 2017-12-07 ENCOUNTER — Ambulatory Visit (INDEPENDENT_AMBULATORY_CARE_PROVIDER_SITE_OTHER): Payer: PPO

## 2017-12-07 VITALS — BP 100/60 | HR 63 | Ht 64.0 in | Wt 123.0 lb

## 2017-12-07 DIAGNOSIS — Z0000 Encounter for general adult medical examination without abnormal findings: Secondary | ICD-10-CM

## 2017-12-07 NOTE — Patient Instructions (Addendum)
Audrey Tucker , Thank you for taking time to come for your Medicare Wellness Visit. I appreciate your ongoing commitment to your health goals. Please review the following plan we discussed and let me know if I can assist you in the future.   Hepatitis C  Medicare now request all "baby boomers" test for possible exposure to Hepatitis C. Many may have been exposed due to dental work, tatoo's, vaccinations when young. The Hepatitis C virus is dormant for many years and then sometimes will cause liver cancer. If you gave blood in the past 15 years, you were most likely checked for Hep C. If you rec'd blood; you may want to consider testing or if you are high risk for any other reason.   The Centers for Disease Control are now recommending 2 pneumonia vaccinations after 41. The first is the Prevnar 13. This helps to boost your immunity to community acquired pneumonia as well as some protection from bacterial pneumonia  The 2nd is the pneumovax 23, which offers more broad protection!  Please consider taking these as this is your best protection against pneumonia.  Keep in mind the flu shot is an inactivated vaccine and takes at least 2 weeks to build immunity. The flu virus can be dormant for 4 days prior to symptoms Taking the flu shot at the beginning of the season can reduce the risk for the entire community.    Shingrix is a vaccine for the prevention of Shingles in Adults 50 and older.  If you are on Medicare, the shingrix is covered under your Part D plan, so you will take both of the vaccines in the series at your pharmacy. Please check with your benefits regarding applicable copays or out of pocket expenses.  The Shingrix is given in 2 vaccines approx 8 weeks apart. You must receive the 2nd dose prior to 6 months from receipt of the first. Please have the pharmacist print out you Immunization  dates for our office records    These are the goals we discussed: Goals    . Exercise 150 min/wk  Moderate Activity     Uses a fit bit!        This is a list of the screening recommended for you and due dates:  Health Maintenance  Topic Date Due  .  Hepatitis C: One time screening is recommended by Center for Disease Control  (CDC) for  adults born from [birth year] through 1965.   [birth year]  . Pneumonia vaccines (1 of 2 - PCV13) 10/23/2010  . Flu Shot  09/15/2017  . Mammogram  09/16/2019  . Colon Cancer Screening  03/01/2022  . Tetanus Vaccine  02/16/2024  . DEXA scan (bone density measurement)  Completed      Fall Prevention in the Home Falls can cause injuries. They can happen to people of all ages. There are many things you can do to make your home safe and to help prevent falls. What can I do on the outside of my home?  Regularly fix the edges of walkways and driveways and fix any cracks.  Remove anything that might make you trip as you walk through a door, such as a raised step or threshold.  Trim any bushes or trees on the path to your home.  Use bright outdoor lighting.  Clear any walking paths of anything that might make someone trip, such as rocks or tools.  Regularly check to see if handrails are loose or broken. Make sure that both sides  of any steps have handrails.  Any raised decks and porches should have guardrails on the edges.  Have any leaves, snow, or ice cleared regularly.  Use sand or salt on walking paths during winter.  Clean up any spills in your garage right away. This includes oil or grease spills. What can I do in the bathroom?  Use night lights.  Install grab bars by the toilet and in the tub and shower. Do not use towel bars as grab bars.  Use non-skid mats or decals in the tub or shower.  If you need to sit down in the shower, use a plastic, non-slip stool.  Keep the floor dry. Clean up any water that spills on the floor as soon as it happens.  Remove soap buildup in the tub or shower regularly.  Attach bath mats securely with  double-sided non-slip rug tape.  Do not have throw rugs and other things on the floor that can make you trip. What can I do in the bedroom?  Use night lights.  Make sure that you have a light by your bed that is easy to reach.  Do not use any sheets or blankets that are too big for your bed. They should not hang down onto the floor.  Have a firm chair that has side arms. You can use this for support while you get dressed.  Do not have throw rugs and other things on the floor that can make you trip. What can I do in the kitchen?  Clean up any spills right away.  Avoid walking on wet floors.  Keep items that you use a lot in easy-to-reach places.  If you need to reach something above you, use a strong step stool that has a grab bar.  Keep electrical cords out of the way.  Do not use floor polish or wax that makes floors slippery. If you must use wax, use non-skid floor wax.  Do not have throw rugs and other things on the floor that can make you trip. What can I do with my stairs?  Do not leave any items on the stairs.  Make sure that there are handrails on both sides of the stairs and use them. Fix handrails that are broken or loose. Make sure that handrails are as long as the stairways.  Check any carpeting to make sure that it is firmly attached to the stairs. Fix any carpet that is loose or worn.  Avoid having throw rugs at the top or bottom of the stairs. If you do have throw rugs, attach them to the floor with carpet tape.  Make sure that you have a light switch at the top of the stairs and the bottom of the stairs. If you do not have them, ask someone to add them for you. What else can I do to help prevent falls?  Wear shoes that: ? Do not have high heels. ? Have rubber bottoms. ? Are comfortable and fit you well. ? Are closed at the toe. Do not wear sandals.  If you use a stepladder: ? Make sure that it is fully opened. Do not climb a closed stepladder. ? Make  sure that both sides of the stepladder are locked into place. ? Ask someone to hold it for you, if possible.  Clearly mark and make sure that you can see: ? Any grab bars or handrails. ? First and last steps. ? Where the edge of each step is.  Use tools that help  you move around (mobility aids) if they are needed. These include: ? Canes. ? Walkers. ? Scooters. ? Crutches.  Turn on the lights when you go into a dark area. Replace any light bulbs as soon as they burn out.  Set up your furniture so you have a clear path. Avoid moving your furniture around.  If any of your floors are uneven, fix them.  If there are any pets around you, be aware of where they are.  Review your medicines with your doctor. Some medicines can make you feel dizzy. This can increase your chance of falling. Ask your doctor what other things that you can do to help prevent falls. This information is not intended to replace advice given to you by your health care provider. Make sure you discuss any questions you have with your health care provider. Document Released: 11/28/2008 Document Revised: 07/10/2015 Document Reviewed: 03/08/2014 Elsevier Interactive Patient Education  2018 [place] Maintenance, Female Adopting a healthy lifestyle and getting preventive care can go a long way to promote health and wellness. Talk with your health care provider about what schedule of regular examinations is right for you. This is a good chance for you to check in with your provider about disease prevention and staying healthy. In between checkups, there are plenty of things you can do on your own. Experts have done a lot of research about which lifestyle changes and preventive measures are most likely to keep you healthy. Ask your health care provider for more information. Weight and diet Eat a healthy diet  Be sure to include plenty of vegetables, fruits, low-fat dairy products, and lean protein.  Do not eat  a lot of foods high in solid fats, added sugars, or salt.  Get regular exercise. This is one of the most important things you can do for your health. ? Most adults should exercise for at least 150 minutes each week. The exercise should increase your heart rate and make you sweat (moderate-intensity exercise). ? Most adults should also do strengthening exercises at least twice a week. This is in addition to the moderate-intensity exercise.  Maintain a healthy weight  Body mass index (BMI) is a measurement that can be used to identify possible weight problems. It estimates body fat based on height and weight. Your health care provider can help determine your BMI and help you achieve or maintain a healthy weight.  For females 75 years of age and older: ? A BMI below 18.5 is considered underweight. ? A BMI of 18.5 to 24.9 is normal. ? A BMI of 25 to 29.9 is considered overweight. ? A BMI of 30 and above is considered obese.  Watch levels of cholesterol and blood lipids  You should start having your blood tested for lipids and cholesterol at 72 years of age, then have this test every 5 years.  You may need to have your cholesterol levels checked more often if: ? Your lipid or cholesterol levels are high. ? You are older than 72 years of age. ? You are at high risk for heart disease.  Cancer screening Lung Cancer  Lung cancer screening is recommended for adults 6-35 years old who are at high risk for lung cancer because of a history of smoking.  A yearly low-dose CT scan of the lungs is recommended for people who: ? Currently smoke. ? Have quit within the past 15 years. ? Have at least a 30-pack-year history of smoking. A pack year is smoking  an average of one pack of cigarettes a day for 1 year.  Yearly screening should continue until it has been 15 years since you quit.  Yearly screening should stop if you develop a health problem that would prevent you from having lung cancer  treatment.  Breast Cancer  Practice breast self-awareness. This means understanding how your breasts normally appear and feel.  It also means doing regular breast self-exams. Let your health care provider know about any changes, no matter how small.  If you are in your 20s or 30s, you should have a clinical breast exam (CBE) by a health care provider every 1-3 years as part of a regular health exam.  If you are 71 or older, have a CBE every year. Also consider having a breast X-ray (mammogram) every year.  If you have a family history of breast cancer, talk to your health care provider about genetic screening.  If you are at high risk for breast cancer, talk to your health care provider about having an MRI and a mammogram every year.  Breast cancer gene (BRCA) assessment is recommended for women who have family members with BRCA-related cancers. BRCA-related cancers include: ? Breast. ? Ovarian. ? Tubal. ? Peritoneal cancers.  Results of the assessment will determine the need for genetic counseling and BRCA1 and BRCA2 testing.  Cervical Cancer Your health care provider may recommend that you be screened regularly for cancer of the pelvic organs (ovaries, uterus, and vagina). This screening involves a pelvic examination, including checking for microscopic changes to the surface of your cervix (Pap test). You may be encouraged to have this screening done every 3 years, beginning at age 29.  For women ages 82-65, health care providers may recommend pelvic exams and Pap testing every 3 years, or they may recommend the Pap and pelvic exam, combined with testing for human papilloma virus (HPV), every 5 years. Some types of HPV increase your risk of cervical cancer. Testing for HPV may also be done on women of any age with unclear Pap test results.  Other health care providers may not recommend any screening for nonpregnant women who are considered low risk for pelvic cancer and who do not have  symptoms. Ask your health care provider if a screening pelvic exam is right for you.  If you have had past treatment for cervical cancer or a condition that could lead to cancer, you need Pap tests and screening for cancer for at least 20 years after your treatment. If Pap tests have been discontinued, your risk factors (such as having a new sexual partner) need to be reassessed to determine if screening should resume. Some women have medical problems that increase the chance of getting cervical cancer. In these cases, your health care provider may recommend more frequent screening and Pap tests.  Colorectal Cancer  This type of cancer can be detected and often prevented.  Routine colorectal cancer screening usually begins at 72 years of age and continues through 72 years of age.  Your health care provider may recommend screening at an earlier age if you have risk factors for colon cancer.  Your health care provider may also recommend using home test kits to check for hidden blood in the stool.  A small camera at the end of a tube can be used to examine your colon directly (sigmoidoscopy or colonoscopy). This is done to check for the earliest forms of colorectal cancer.  Routine screening usually begins at age 71.  Direct examination of  the colon should be repeated every 5-10 years through 72 years of age. However, you may need to be screened more often if early forms of precancerous polyps or small growths are found.  Skin Cancer  Check your skin from head to toe regularly.  Tell your health care provider about any new moles or changes in moles, especially if there is a change in a mole's shape or color.  Also tell your health care provider if you have a mole that is larger than the size of a pencil eraser.  Always use sunscreen. Apply sunscreen liberally and repeatedly throughout the day.  Protect yourself by wearing long sleeves, pants, a wide-brimmed hat, and sunglasses whenever you  are outside.  Heart disease, diabetes, and high blood pressure  High blood pressure causes heart disease and increases the risk of stroke. High blood pressure is more likely to develop in: ? People who have blood pressure in the high end of the normal range (130-139/85-89 mm Hg). ? People who are overweight or obese. ? People who are African American.  If you are 48-61 years of age, have your blood pressure checked every 3-5 years. If you are 15 years of age or older, have your blood pressure checked every year. You should have your blood pressure measured twice-once when you are at a hospital or clinic, and once when you are not at a hospital or clinic. Record the average of the two measurements. To check your blood pressure when you are not at a hospital or clinic, you can use: ? An automated blood pressure machine at a pharmacy. ? A home blood pressure monitor.  If you are between 33 years and 24 years old, ask your health care provider if you should take aspirin to prevent strokes.  Have regular diabetes screenings. This involves taking a blood sample to check your fasting blood sugar level. ? If you are at a normal weight and have a low risk for diabetes, have this test once every three years after 72 years of age. ? If you are overweight and have a high risk for diabetes, consider being tested at a younger age or more often. Preventing infection Hepatitis B  If you have a higher risk for hepatitis B, you should be screened for this virus. You are considered at high risk for hepatitis B if: ? You were born in a country where hepatitis B is common. Ask your health care provider which countries are considered high risk. ? Your parents were born in a high-risk country, and you have not been immunized against hepatitis B (hepatitis B vaccine). ? You have HIV or AIDS. ? You use needles to inject street drugs. ? You live with someone who has hepatitis B. ? You have had sex with someone who  has hepatitis B. ? You get hemodialysis treatment. ? You take certain medicines for conditions, including cancer, organ transplantation, and autoimmune conditions.  Hepatitis C  Blood testing is recommended for: ? Everyone born from [birth year] through 1965. ? Anyone with known risk factors for hepatitis C.  Sexually transmitted infections (STIs)  You should be screened for sexually transmitted infections (STIs) including gonorrhea and chlamydia if: ? You are sexually active and are younger than 72 years of age. ? You are older than 72 years of age and your health care provider tells you that you are at risk for this type of infection. ? Your sexual activity has changed since you were last screened and you are at  an increased risk for chlamydia or gonorrhea. Ask your health care provider if you are at risk.  If you do not have HIV, but are at risk, it may be recommended that you take a prescription medicine daily to prevent HIV infection. This is called pre-exposure prophylaxis (PrEP). You are considered at risk if: ? You are sexually active and do not regularly use condoms or know the HIV status of your partner(s). ? You take drugs by injection. ? You are sexually active with a partner who has HIV.  Talk with your health care provider about whether you are at high risk of being infected with HIV. If you choose to begin PrEP, you should first be tested for HIV. You should then be tested every 3 months for as long as you are taking PrEP. Pregnancy  If you are premenopausal and you may become pregnant, ask your health care provider about preconception counseling.  If you may become pregnant, take 400 to 800 micrograms (mcg) of folic acid every day.  If you want to prevent pregnancy, talk to your health care provider about birth control (contraception). Osteoporosis and menopause  Osteoporosis is a disease in which the bones lose minerals and strength with aging. This can result in serious bone  fractures. Your risk for osteoporosis can be identified using a bone density scan.  If you are 72 years of age or older, or if you are at risk for osteoporosis and fractures, ask your health care provider if you should be screened.  Ask your health care provider whether you should take a calcium or vitamin D supplement to lower your risk for osteoporosis.  Menopause may have certain physical symptoms and risks.  Hormone replacement therapy may reduce some of these symptoms and risks. Talk to your health care provider about whether hormone replacement therapy is right for you. Follow these instructions at home:  Schedule regular health, dental, and eye exams.  Stay current with your immunizations.  Do not use any tobacco products including cigarettes, chewing tobacco, or electronic cigarettes.  If you are pregnant, do not drink alcohol.  If you are breastfeeding, limit how much and how often you drink alcohol.  Limit alcohol intake to no more than 1 drink per day for nonpregnant women. One drink equals 12 ounces of beer, 5 ounces of wine, or 1 ounces of hard liquor.  Do not use street drugs.  Do not share needles.  Ask your health care provider for help if you need support or information about quitting drugs.  Tell your health care provider if you often feel depressed.  Tell your health care provider if you have ever been abused or do not feel safe at home. This information is not intended to replace advice given to you by your health care provider. Make sure you discuss any questions you have with your health care provider. Document Released: 08/17/2010 Document Revised: 07/10/2015 Document Reviewed: 11/05/2014 Elsevier Interactive Patient Education  [employer].

## 2017-12-19 NOTE — Progress Notes (Signed)
I have reviewed documentation from this visit and I agree with recommendations given.  Betty G. Jordan, MD  [facility]. [place] office.   

## 2018-01-11 ENCOUNTER — Ambulatory Visit (INDEPENDENT_AMBULATORY_CARE_PROVIDER_SITE_OTHER): Payer: PPO | Admitting: Family Medicine

## 2018-01-11 ENCOUNTER — Encounter: Payer: Self-pay | Admitting: Family Medicine

## 2018-01-11 VITALS — BP 104/66 | HR 68 | Temp 98.0°F | Resp 12 | Ht 64.0 in | Wt 125.0 lb

## 2018-01-11 DIAGNOSIS — Z9189 Other specified personal risk factors, not elsewhere classified: Secondary | ICD-10-CM | POA: Diagnosis not present

## 2018-01-11 DIAGNOSIS — Z1159 Encounter for screening for other viral diseases: Secondary | ICD-10-CM

## 2018-01-11 DIAGNOSIS — Z0000 Encounter for general adult medical examination without abnormal findings: Secondary | ICD-10-CM

## 2018-01-11 DIAGNOSIS — R3915 Urgency of urination: Secondary | ICD-10-CM | POA: Diagnosis not present

## 2018-01-11 LAB — URINALYSIS, ROUTINE W REFLEX MICROSCOPIC
Bilirubin Urine: NEGATIVE
Hgb urine dipstick: NEGATIVE
Ketones, ur: NEGATIVE
Leukocytes, UA: NEGATIVE
Nitrite: NEGATIVE
PH: 5.5 (ref 5.0–8.0)
RBC / HPF: NONE SEEN (ref 0–?)
SPECIFIC GRAVITY, URINE: 1.01 (ref 1.000–1.030)
Total Protein, Urine: NEGATIVE
URINE GLUCOSE: NEGATIVE
UROBILINOGEN UA: 0.2 (ref 0.0–1.0)

## 2018-01-11 NOTE — Progress Notes (Addendum)
HPI:   Audrey Tucker is a 72 y.o. female, who is here today for her routine physical.  Last CPE: Over a year ago.  Regular exercise 3 or more time per week: Not consistently but states that she is very active around her house and work.  Following a healthful diet: Yes. She lives with her husband. She still works, she is a Electrical engineer.  Chronic medical problems: Allergic rhinitis,menopusal hot flashes,and OA among some. Takes Paroxetine for hot flashes. Lorazepam for RLS,prn  Pap smear 2015 She follows with gynecologist regularly, last visit about 3 months ago.   Immunization History  Administered Date(s) Administered  . Influenza Whole 12/14/2006  . Influenza-Unspecified 11/29/2013  . Tdap 02/15/2014    Mammogram: 09/15/2017. Colonoscopy: 02/2017 DEXA: 09/15/2016.  Hep C screening: She does not remember having it done in the past.  Concerns today:  Today she is complaining about urine urgency, which seems to be exacerbated by hearing water running.  Problem has been going on for about 2 months. She denies dysuria, gross hematuria, or urine incontinence. No pelvic pain, vaginal discharge, or bleeding.   Review of Systems  Constitutional: Negative for appetite change, fatigue and fever.  HENT: Negative for dental problem, hearing loss, mouth sores, sore throat, trouble swallowing and voice change.   Eyes: Negative for redness and visual disturbance.  Respiratory: Negative for cough, shortness of breath and wheezing.   Cardiovascular: Negative for chest pain and leg swelling.  Gastrointestinal: Negative for abdominal pain, nausea and vomiting.       No changes in bowel habits.  Endocrine: Negative for cold intolerance, heat intolerance, polydipsia, polyphagia and polyuria.  Genitourinary: Negative for decreased urine volume, dysuria, hematuria, vaginal bleeding and vaginal discharge. +Urgency. Musculoskeletal: Negative for arthralgias, gait problem and  myalgias.  Skin: Negative for color change and rash.  Allergic/Immunologic: Negative for environmental allergies.  Neurological: Negative for syncope, weakness and headaches.  Hematological: Negative for adenopathy. Does not bruise/bleed easily.  Psychiatric/Behavioral: Negative for confusion and sleep disturbance. The patient is not nervous/anxious.   All other systems reviewed and are negative.     Current Outpatient Medications on File Prior to Visit  Medication Sig Dispense Refill  . LORazepam (ATIVAN) 0.5 MG tablet Take 1 tablet (0.5 mg total) as needed by mouth for anxiety. 30 tablet 0  . methocarbamol (ROBAXIN) 500 MG tablet Take 1 tablet (500 mg total) by mouth 3 (three) times daily. 40 tablet 1  . PARoxetine Mesylate 7.5 MG CAPS Take 1 tablet daily by mouth.  11   Current Facility-Administered Medications on File Prior to Visit  Medication Dose Route Frequency Provider Last Rate Last Dose  . 0.9 %  sodium chloride infusion  500 mL Intravenous Once Irene Shipper, MD         Past Medical History:  Diagnosis Date  . Adenomatous colon polyp    tubular  . Arthritis   . Chicken pox   . Diverticulosis   . Frequent headaches   . PONV (postoperative nausea and vomiting)   . Seasonal allergies     Past Surgical History:  Procedure Laterality Date  . FOOT NEUROMA SURGERY Right 04-29-15   Dr Paulla Dolly  . SHOULDER ARTHROSCOPY Left     Allergies  Allergen Reactions  . No Known Allergies     Family History  Problem Relation Age of Onset  . Arthritis Mother   . Hypertension Mother   . Arthritis Father   . Cancer  Father        lung    Social History   Socioeconomic History  . Marital status: Married    Spouse name: Not on file  . Number of children: Not on file  . Years of education: Not on file  . Highest education level: Not on file  Occupational History  . Not on file  Social Needs  . Financial resource strain: Not on file  . Food insecurity:    Worry: Not  on file    Inability: Not on file  . Transportation needs:    Medical: Not on file    Non-medical: Not on file  Tobacco Use  . Smoking status: Former Smoker    Last attempt to quit: 04/05/1967    Years since quitting: 50.8  . Smokeless tobacco: Never Used  . Tobacco comment: rare use in college   Substance and Sexual Activity  . Alcohol use: Yes    Comment: social  . Drug use: No  . Sexual activity: Not on file  Lifestyle  . Physical activity:    Days per week: Not on file    Minutes per session: Not on file  . Stress: Not on file  Relationships  . Social connections:    Talks on phone: Not on file    Gets together: Not on file    Attends religious service: Not on file    Active member of club or organization: Not on file    Attends meetings of clubs or organizations: Not on file    Relationship status: Not on file  Other Topics Concern  . Not on file  Social History Narrative  . Not on file     Vitals:   01/11/18 0759  BP: 104/66  Pulse: 68  Resp: 12  Temp: 98 F (36.7 C)  SpO2: 97%   Body mass index is 21.46 kg/m.   Wt Readings from Last 3 Encounters:  01/11/18 125 lb (56.7 kg)  12/07/17 123 lb (55.8 kg)  04/04/17 129 lb (58.5 kg)     Physical Exam  Nursing note and vitals reviewed. Constitutional: She is oriented to person, place, and time. She appears well-developed and well-nourished. No distress.  HENT:  Head: Normocephalic and atraumatic.  Right Ear: Hearing, tympanic membrane, external ear and ear canal normal.  Left Ear: Hearing, tympanic membrane, external ear and ear canal normal.  Mouth/Throat: Uvula is midline, oropharynx is clear and moist and mucous membranes are normal.  Eyes: Pupils are equal, round, and reactive to light. Conjunctivae and EOM are normal.  Neck: No tracheal deviation present. No thyromegaly present.  Cardiovascular: Normal rate and regular rhythm.  No murmur heard. Pulses:      Dorsalis pedis pulses are 2+ on the  right side, and 2+ on the left side.  Respiratory: Effort normal and breath sounds normal. No respiratory distress.  GI: Soft. She exhibits no mass. There is no hepatomegaly. There is no tenderness.  Genitourinary:  Genitourinary Comments: Deferred to gyn.  Musculoskeletal: She exhibits no edema.  No signs of synovitis appreciated.  Lymphadenopathy:    She has no cervical adenopathy.       Right: No supraclavicular adenopathy present.       Left: No supraclavicular adenopathy present.  Neurological: She is alert and oriented to person, place, and time. She has normal strength. No cranial nerve deficit. Coordination and gait normal.  Reflex Scores:      Bicep reflexes are 2+ on the right side and  2+ on the left side.      Patellar reflexes are 2+ on the right side and 2+ on the left side. Skin: Skin is warm. No rash noted. No erythema.  Psychiatric: She has a normal mood and affect. Well groomed, good eye contact.     ASSESSMENT AND PLAN:  Ms. Aarohi Redditt was here today annual physical examination.   Orders Placed This Encounter  Procedures  . Hepatitis C antibody screen  . Urinalysis, Routine w reflex microscopic    Routine general medical examination at a health care facility We discussed the importance of regular physical activity and healthy diet for prevention of chronic illness and/or complications. Preventive guidelines reviewed. Vaccination: She does not take vaccines. She will continue following with gynecologist for her female preventive care.  Ca++ and vit D supplementation recommended. Next CPE in a year.  Encounter for HCV screening test for high risk patient -     Hepatitis C antibody screen  Urgency of urination She is not interested in pharmacologic treatment. Kegel and pelvic floor exercises may help. F/U as needed.  -     Urinalysis, Routine w reflex microscopic; Future    Return in 1 year (on 01/12/2019) for CPE.      Carneshia Raker G. [place],  MD  [facility]. [place] office.

## 2018-01-11 NOTE — Patient Instructions (Addendum)
Today you have you routine preventive visit.  At least 150 minutes of moderate exercise per week, daily brisk walking for 15-30 min is a good exercise option. Healthy diet low in saturated (animal) fats and sweets and consisting of fresh fruits and vegetables, lean meats such as fish and white chicken and whole grains.  This exercise helps with mild urine urgency. It may help with other types of urine incontinence and even with mild fecal incontinence.   Tighten and relax the pelvic muscles intermittently during the day. Once you are familiar with exercise try to hold pelvic muscles contraction for about 8-10 seconds.in the beginning you may not be able to hold contraction for more than a second or 2 but eventually you will be able to hold contraction harder and for longer time. Perform  8-12 exercises 3 times per day and daily for 15-20 weeks. You will need to continue exercises indefinitely to have a lasting effect.  These are some of recommendations for screening depending of age and risk factors:       - Vaccines:  Tdap vaccine every 10 years.  Shingles vaccine recommended at age 71, could be given after 72 years of age but not sure about insurance coverage.   Pneumonia vaccines:  Prevnar 13 at 65 and Pneumovax at 47. Sometimes Pneumovax is giving earlier if history of smoking, lung disease,diabetes,kidney disease among some.    Screening for diabetes at age 40 and every 3 years.  Cervical cancer prevention:  Pap smear starts at 72 years of age and continues periodically until 72 years old in low risk women. Pap smear every 3 years between 52 and 81 years old. Pap smear every 3-5 years between women 41 and older if pap smear negative and HPV screening negative.   -Breast cancer: Mammogram every 1-2 years and after 72 yo q 2 years. Screening is recommended until 72 years old but some women can continue screening depending of healthy issues.   Colon cancer screening: starts  at 72 years old until 72 years old.  Cholesterol disorder screening at age 3 and every 3 years.  Also recommended:  1. Dental visit- Brush and floss your teeth twice daily; visit your dentist twice a year. 2. Eye doctor- Get an eye exam at least every 2 years. 3. Helmet use- Always wear a helmet when riding a bicycle, motorcycle, rollerblading or skateboarding. 4. Safe sex- If you may be exposed to sexually transmitted infections, use a condom. 5. Seat belts- Seat belts can save your live; always wear one. 6. Smoke/Carbon Monoxide detectors- These detectors need to be installed on the appropriate level of your home. Replace batteries at least once a year. 7. Skin cancer- When out in the sun please cover up and use sunscreen 15 SPF or higher. 8. Violence- If anyone is threatening or hurting you, please tell your healthcare provider.  9. Drink alcohol in moderation- Limit alcohol intake to one drink or less per day. Never drink and drive.

## 2018-01-13 LAB — HEPATITIS C ANTIBODY
HEP C AB: NONREACTIVE
SIGNAL TO CUT-OFF: 0.01 (ref <1.00)

## 2018-01-14 ENCOUNTER — Encounter: Payer: Self-pay | Admitting: Family Medicine

## 2018-01-31 ENCOUNTER — Other Ambulatory Visit: Payer: Self-pay | Admitting: Family Medicine

## 2018-01-31 DIAGNOSIS — G2581 Restless legs syndrome: Secondary | ICD-10-CM

## 2018-01-31 NOTE — Telephone Encounter (Signed)
Copied from [place] [id number]. Topic: Quick Communication - Rx Refill/Question >> Jan 31, 2018  9:05 AM Alanda Slim E wrote: Medication: LORazepam (ATIVAN) 0.5 MG tablet   Has the patient contacted their pharmacy? Yes.   Pharmacy sent a request in for this medication   Preferred Pharmacy (with phone number or street name): [id number] - [place], [place] - [address] [place] [address] [place] [address] [phone] (Phone) [phone] (Fax)    Agent: Please be advised that RX refills may take up to 3 business days. We ask that you follow-up with your pharmacy.

## 2018-01-31 NOTE — Telephone Encounter (Signed)
Requested medication (s) are due for refill today: yes  Requested medication (s) are on the active medication list: yes    Last refill: 12/31/16  #30  0 refills  Future visit scheduled no  Notes to clinic:not delegated  Requested Prescriptions  Pending Prescriptions Disp Refills   LORazepam (ATIVAN) 0.5 MG tablet [Pharmacy Med Name: LORAZEPAM 0.5 MG TABLET] 30 tablet     Sig: TAKE 1 TABLET BY MOUTH AS NEEDED FOR ANXIETY     Not Delegated - Psychiatry:  Anxiolytics/Hypnotics Failed - 01/31/2018  9:08 AM      Failed - This refill cannot be delegated      Failed - Urine Drug Screen completed in last 360 days.      Passed - Valid encounter within last 6 months    Recent Outpatient Visits          2 weeks ago Routine general medical examination at a health care facility   [facility] [place], Malka So, MD   1 year ago Rectal bleed   [place] [facility] [place], Malka So, MD   1 year ago Generalized osteoarthritis of hand   [place] [facility] [place], Malka So, MD   1 year ago Other fatigue   [place] [facility] [place], Malka So, MD   4 years ago Allergic rhinitis, unspecified allergic rhinitis type   Therapist, music at [facility] [facility], Imogene Burn, [place]

## 2018-06-22 DIAGNOSIS — H02831 Dermatochalasis of right upper eyelid: Secondary | ICD-10-CM | POA: Diagnosis not present

## 2018-06-22 DIAGNOSIS — H02834 Dermatochalasis of left upper eyelid: Secondary | ICD-10-CM | POA: Diagnosis not present

## 2018-08-24 DIAGNOSIS — M9902 Segmental and somatic dysfunction of thoracic region: Secondary | ICD-10-CM | POA: Diagnosis not present

## 2018-08-24 DIAGNOSIS — S29012A Strain of muscle and tendon of back wall of thorax, initial encounter: Secondary | ICD-10-CM | POA: Diagnosis not present

## 2018-08-24 DIAGNOSIS — M47819 Spondylosis without myelopathy or radiculopathy, site unspecified: Secondary | ICD-10-CM | POA: Diagnosis not present

## 2018-08-24 DIAGNOSIS — M9901 Segmental and somatic dysfunction of cervical region: Secondary | ICD-10-CM | POA: Diagnosis not present

## 2018-08-24 DIAGNOSIS — M47812 Spondylosis without myelopathy or radiculopathy, cervical region: Secondary | ICD-10-CM | POA: Diagnosis not present

## 2018-08-24 DIAGNOSIS — M9903 Segmental and somatic dysfunction of lumbar region: Secondary | ICD-10-CM | POA: Diagnosis not present

## 2018-08-29 DIAGNOSIS — M9902 Segmental and somatic dysfunction of thoracic region: Secondary | ICD-10-CM | POA: Diagnosis not present

## 2018-08-29 DIAGNOSIS — M9901 Segmental and somatic dysfunction of cervical region: Secondary | ICD-10-CM | POA: Diagnosis not present

## 2018-08-29 DIAGNOSIS — M47819 Spondylosis without myelopathy or radiculopathy, site unspecified: Secondary | ICD-10-CM | POA: Diagnosis not present

## 2018-08-29 DIAGNOSIS — M47812 Spondylosis without myelopathy or radiculopathy, cervical region: Secondary | ICD-10-CM | POA: Diagnosis not present

## 2018-08-29 DIAGNOSIS — M9903 Segmental and somatic dysfunction of lumbar region: Secondary | ICD-10-CM | POA: Diagnosis not present

## 2018-08-29 DIAGNOSIS — S29012A Strain of muscle and tendon of back wall of thorax, initial encounter: Secondary | ICD-10-CM | POA: Diagnosis not present

## 2018-08-31 DIAGNOSIS — M47819 Spondylosis without myelopathy or radiculopathy, site unspecified: Secondary | ICD-10-CM | POA: Diagnosis not present

## 2018-08-31 DIAGNOSIS — M9901 Segmental and somatic dysfunction of cervical region: Secondary | ICD-10-CM | POA: Diagnosis not present

## 2018-08-31 DIAGNOSIS — S29012A Strain of muscle and tendon of back wall of thorax, initial encounter: Secondary | ICD-10-CM | POA: Diagnosis not present

## 2018-08-31 DIAGNOSIS — M9902 Segmental and somatic dysfunction of thoracic region: Secondary | ICD-10-CM | POA: Diagnosis not present

## 2018-08-31 DIAGNOSIS — M47812 Spondylosis without myelopathy or radiculopathy, cervical region: Secondary | ICD-10-CM | POA: Diagnosis not present

## 2018-08-31 DIAGNOSIS — M9903 Segmental and somatic dysfunction of lumbar region: Secondary | ICD-10-CM | POA: Diagnosis not present

## 2018-09-05 DIAGNOSIS — M47812 Spondylosis without myelopathy or radiculopathy, cervical region: Secondary | ICD-10-CM | POA: Diagnosis not present

## 2018-09-05 DIAGNOSIS — M47819 Spondylosis without myelopathy or radiculopathy, site unspecified: Secondary | ICD-10-CM | POA: Diagnosis not present

## 2018-09-05 DIAGNOSIS — S29012A Strain of muscle and tendon of back wall of thorax, initial encounter: Secondary | ICD-10-CM | POA: Diagnosis not present

## 2018-09-05 DIAGNOSIS — M9902 Segmental and somatic dysfunction of thoracic region: Secondary | ICD-10-CM | POA: Diagnosis not present

## 2018-09-05 DIAGNOSIS — M9903 Segmental and somatic dysfunction of lumbar region: Secondary | ICD-10-CM | POA: Diagnosis not present

## 2018-09-05 DIAGNOSIS — M9901 Segmental and somatic dysfunction of cervical region: Secondary | ICD-10-CM | POA: Diagnosis not present

## 2018-09-07 DIAGNOSIS — M47819 Spondylosis without myelopathy or radiculopathy, site unspecified: Secondary | ICD-10-CM | POA: Diagnosis not present

## 2018-09-07 DIAGNOSIS — M9902 Segmental and somatic dysfunction of thoracic region: Secondary | ICD-10-CM | POA: Diagnosis not present

## 2018-09-07 DIAGNOSIS — M9901 Segmental and somatic dysfunction of cervical region: Secondary | ICD-10-CM | POA: Diagnosis not present

## 2018-09-07 DIAGNOSIS — M47812 Spondylosis without myelopathy or radiculopathy, cervical region: Secondary | ICD-10-CM | POA: Diagnosis not present

## 2018-09-07 DIAGNOSIS — S29012A Strain of muscle and tendon of back wall of thorax, initial encounter: Secondary | ICD-10-CM | POA: Diagnosis not present

## 2018-09-07 DIAGNOSIS — M9903 Segmental and somatic dysfunction of lumbar region: Secondary | ICD-10-CM | POA: Diagnosis not present

## 2018-09-20 DIAGNOSIS — M47812 Spondylosis without myelopathy or radiculopathy, cervical region: Secondary | ICD-10-CM | POA: Diagnosis not present

## 2018-09-20 DIAGNOSIS — M9903 Segmental and somatic dysfunction of lumbar region: Secondary | ICD-10-CM | POA: Diagnosis not present

## 2018-09-20 DIAGNOSIS — M47819 Spondylosis without myelopathy or radiculopathy, site unspecified: Secondary | ICD-10-CM | POA: Diagnosis not present

## 2018-09-20 DIAGNOSIS — S29012A Strain of muscle and tendon of back wall of thorax, initial encounter: Secondary | ICD-10-CM | POA: Diagnosis not present

## 2018-09-20 DIAGNOSIS — M9902 Segmental and somatic dysfunction of thoracic region: Secondary | ICD-10-CM | POA: Diagnosis not present

## 2018-09-20 DIAGNOSIS — M9901 Segmental and somatic dysfunction of cervical region: Secondary | ICD-10-CM | POA: Diagnosis not present

## 2018-10-03 DIAGNOSIS — H25812 Combined forms of age-related cataract, left eye: Secondary | ICD-10-CM | POA: Diagnosis not present

## 2018-10-03 DIAGNOSIS — Z01818 Encounter for other preprocedural examination: Secondary | ICD-10-CM | POA: Diagnosis not present

## 2018-10-03 DIAGNOSIS — H25811 Combined forms of age-related cataract, right eye: Secondary | ICD-10-CM | POA: Diagnosis not present

## 2018-10-04 DIAGNOSIS — M9902 Segmental and somatic dysfunction of thoracic region: Secondary | ICD-10-CM | POA: Diagnosis not present

## 2018-10-04 DIAGNOSIS — M47819 Spondylosis without myelopathy or radiculopathy, site unspecified: Secondary | ICD-10-CM | POA: Diagnosis not present

## 2018-10-04 DIAGNOSIS — M9901 Segmental and somatic dysfunction of cervical region: Secondary | ICD-10-CM | POA: Diagnosis not present

## 2018-10-04 DIAGNOSIS — M9903 Segmental and somatic dysfunction of lumbar region: Secondary | ICD-10-CM | POA: Diagnosis not present

## 2018-10-04 DIAGNOSIS — S29012A Strain of muscle and tendon of back wall of thorax, initial encounter: Secondary | ICD-10-CM | POA: Diagnosis not present

## 2018-10-04 DIAGNOSIS — M47812 Spondylosis without myelopathy or radiculopathy, cervical region: Secondary | ICD-10-CM | POA: Diagnosis not present

## 2018-10-17 DIAGNOSIS — B078 Other viral warts: Secondary | ICD-10-CM | POA: Diagnosis not present

## 2018-10-17 DIAGNOSIS — D485 Neoplasm of uncertain behavior of skin: Secondary | ICD-10-CM | POA: Diagnosis not present

## 2018-10-17 DIAGNOSIS — L570 Actinic keratosis: Secondary | ICD-10-CM | POA: Diagnosis not present

## 2018-10-17 DIAGNOSIS — L814 Other melanin hyperpigmentation: Secondary | ICD-10-CM | POA: Diagnosis not present

## 2018-10-17 DIAGNOSIS — L821 Other seborrheic keratosis: Secondary | ICD-10-CM | POA: Diagnosis not present

## 2018-10-17 DIAGNOSIS — Z85828 Personal history of other malignant neoplasm of skin: Secondary | ICD-10-CM | POA: Diagnosis not present

## 2018-10-27 DIAGNOSIS — H2512 Age-related nuclear cataract, left eye: Secondary | ICD-10-CM | POA: Diagnosis not present

## 2018-10-27 DIAGNOSIS — H25812 Combined forms of age-related cataract, left eye: Secondary | ICD-10-CM | POA: Diagnosis not present

## 2018-11-03 DIAGNOSIS — M9903 Segmental and somatic dysfunction of lumbar region: Secondary | ICD-10-CM | POA: Diagnosis not present

## 2018-11-03 DIAGNOSIS — M47812 Spondylosis without myelopathy or radiculopathy, cervical region: Secondary | ICD-10-CM | POA: Diagnosis not present

## 2018-11-03 DIAGNOSIS — S29012A Strain of muscle and tendon of back wall of thorax, initial encounter: Secondary | ICD-10-CM | POA: Diagnosis not present

## 2018-11-03 DIAGNOSIS — M47819 Spondylosis without myelopathy or radiculopathy, site unspecified: Secondary | ICD-10-CM | POA: Diagnosis not present

## 2018-11-03 DIAGNOSIS — M9901 Segmental and somatic dysfunction of cervical region: Secondary | ICD-10-CM | POA: Diagnosis not present

## 2018-11-03 DIAGNOSIS — M9902 Segmental and somatic dysfunction of thoracic region: Secondary | ICD-10-CM | POA: Diagnosis not present

## 2018-11-07 IMAGING — US US ABDOMEN COMPLETE
1 series · 14 of 25 positions shown · non-contrast
Comparison: None

CLINICAL DATA: Epigastric abdominal pain and nausea without
vomiting for 3 weeks, former smoker

EXAM:
ABDOMEN ULTRASOUND COMPLETE

[Series 1: us abdomen complete · 0.20mm/px · 14 of 93 slices shown]
[im 1/93]
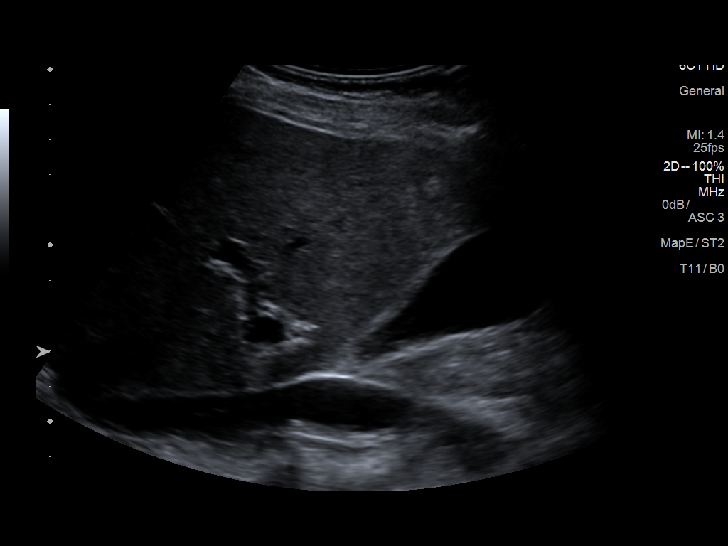
[im 8/93]
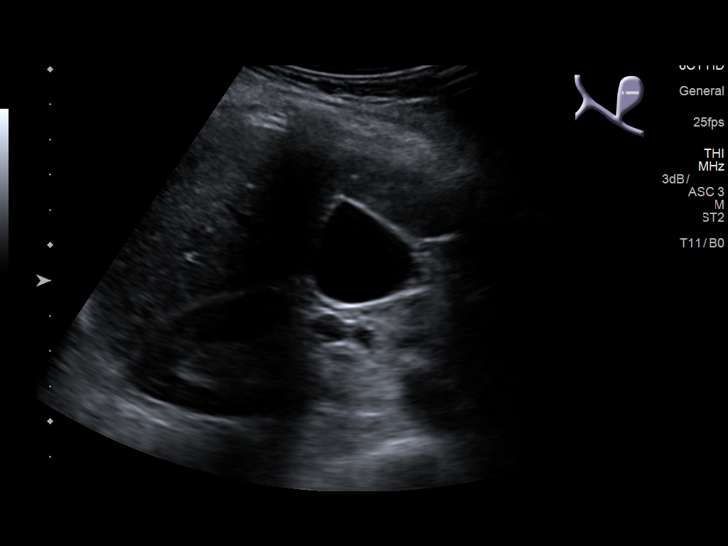
[im 16/93]
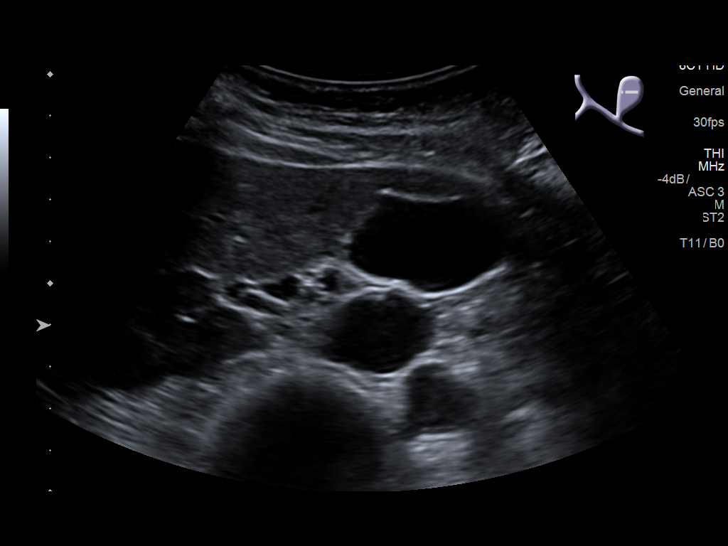
[im 24/93]
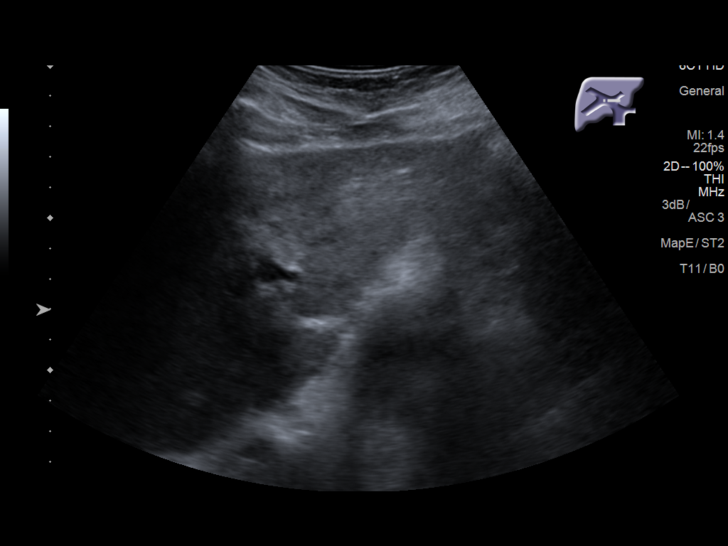
[im 31/93]
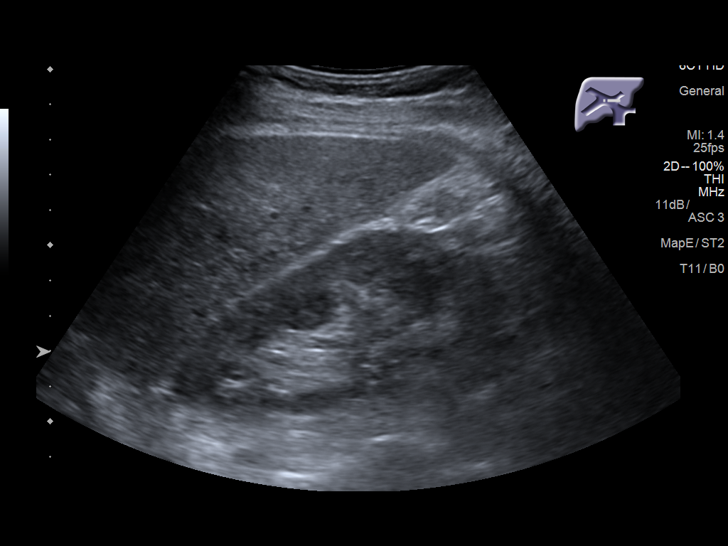
[im 35/93]
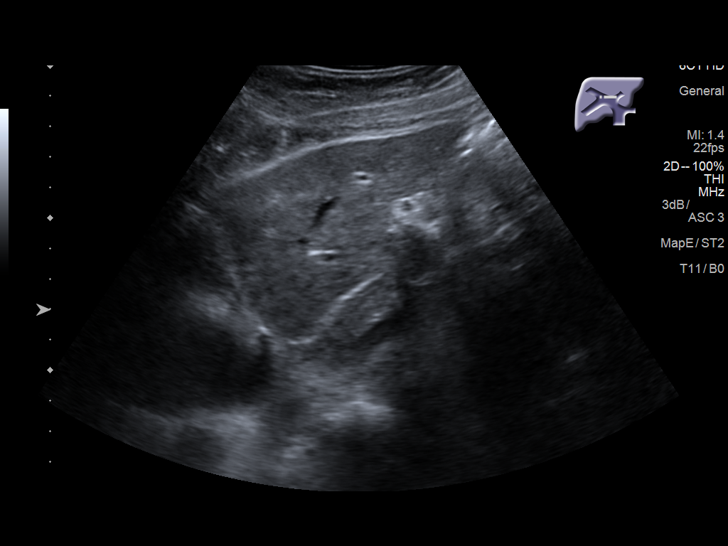
[im 43/93]
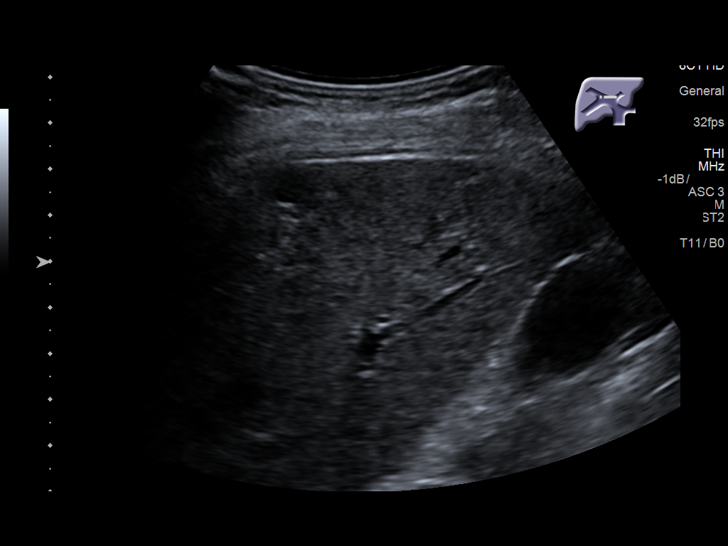
[im 50/93]
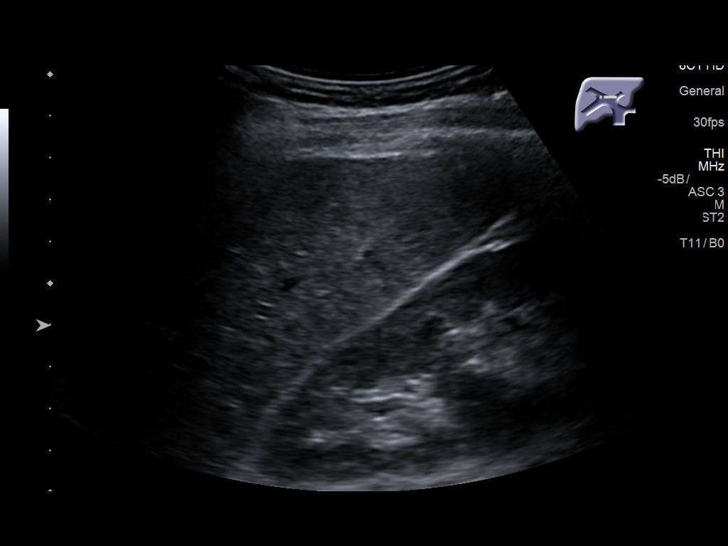
[im 58/93]
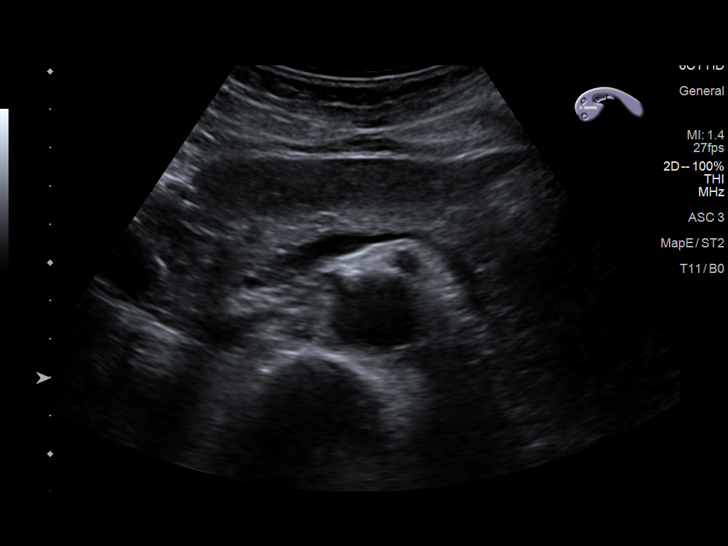
[im 62/93]
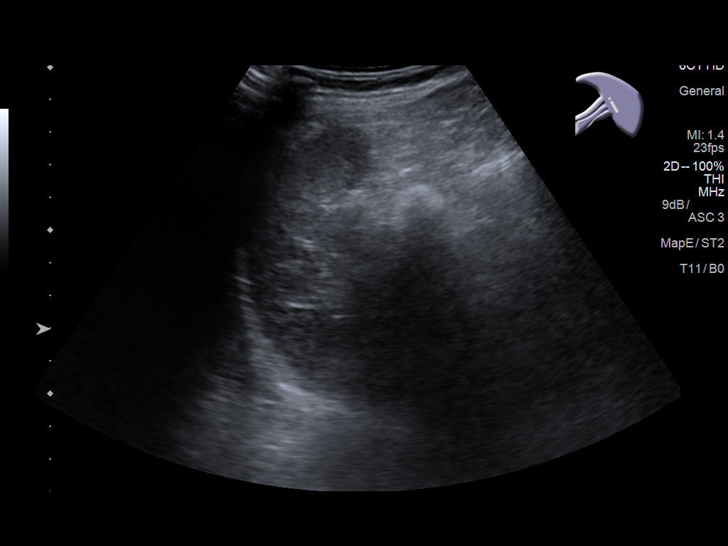
[im 70/93]
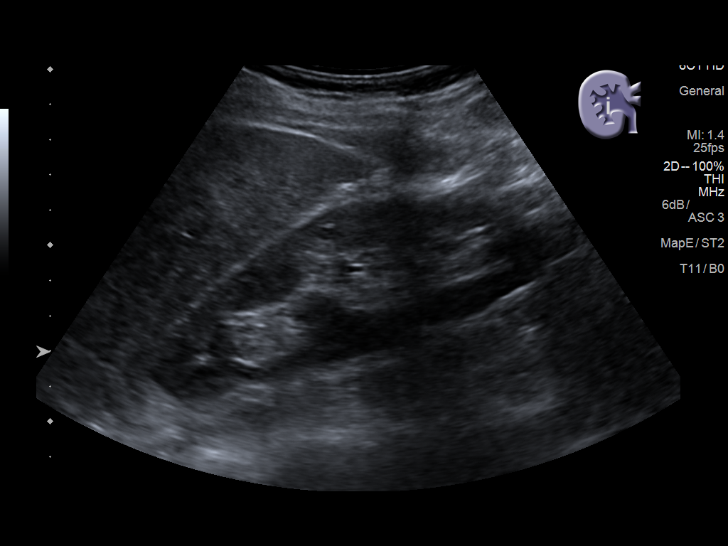
[im 77/93]
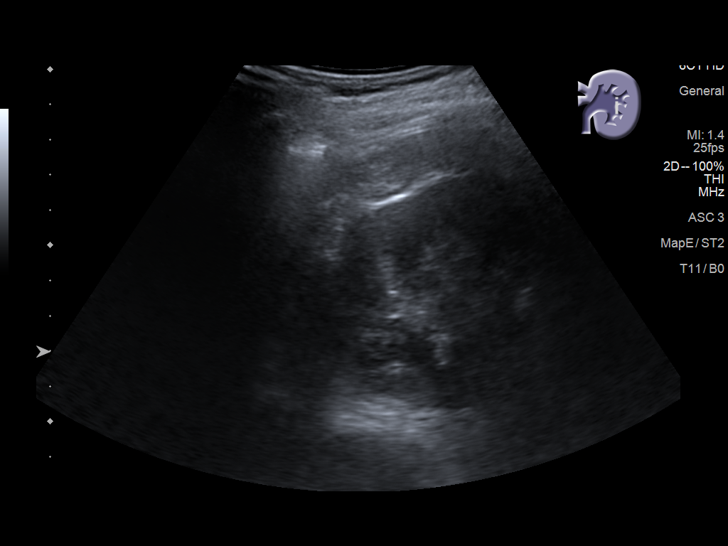
[im 85/93]
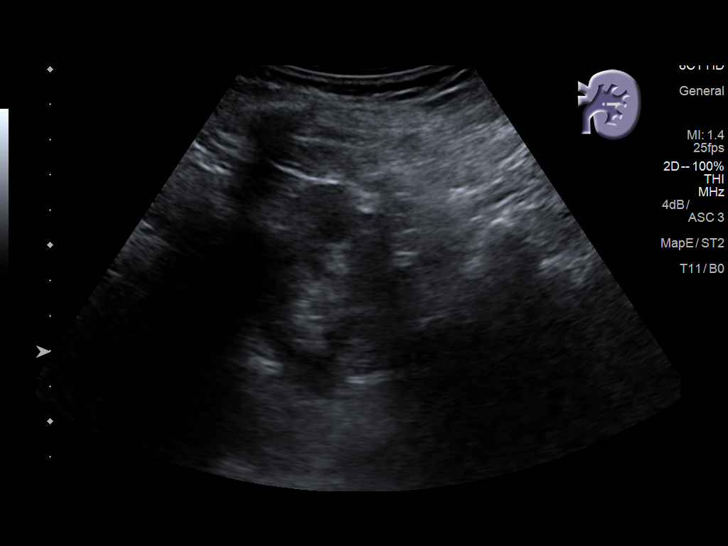
[im 93/93]
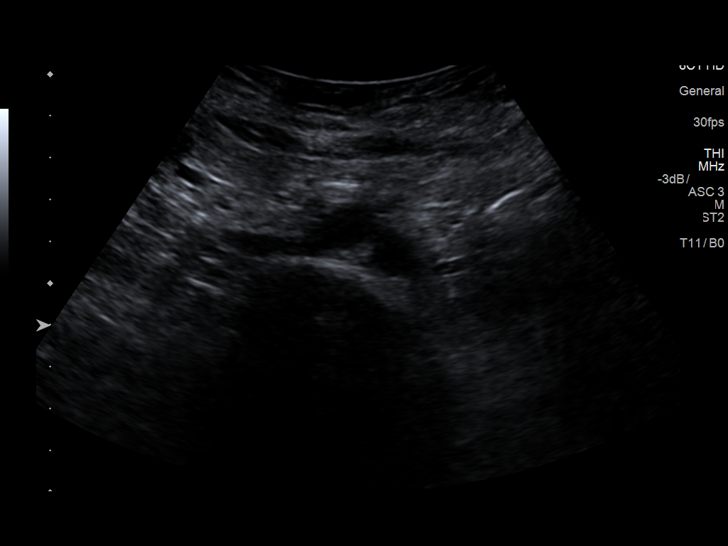

[14 of 25 positions shown; findings below may reference images not displayed]

FINDINGS: Gallbladder: Normally distended without stones or wall thickening.
No pericholecystic fluid or sonographic Murphy sign.

Common bile duct: Diameter: 4 mm diameter, normal

Liver: Normal echogenicity. Tiny nodule RIGHT lobe 10 x 10 x 8 mm
likely a small subcapsular cyst. Portal vein is patent on color
Doppler imaging with normal direction of blood flow towards the
liver.

IVC: Normal appearance

Pancreas: Normal appearance

Spleen: Normal appearance, 3.9 cm length

Right Kidney: Length: 12.9 cm. Normal morphology without mass or
hydronephrosis.

Left Kidney: Length: 11.7 cm. Normal morphology without mass or
hydronephrosis.

Abdominal aorta: Normal caliber

Other findings: No free fluid
IMPRESSION: Tiny hepatic cyst 10 mm diameter.

Otherwise normal exam.

## 2018-11-08 DIAGNOSIS — Z1231 Encounter for screening mammogram for malignant neoplasm of breast: Secondary | ICD-10-CM | POA: Diagnosis not present

## 2018-11-08 DIAGNOSIS — Z6821 Body mass index (BMI) 21.0-21.9, adult: Secondary | ICD-10-CM | POA: Diagnosis not present

## 2018-11-08 DIAGNOSIS — Z124 Encounter for screening for malignant neoplasm of cervix: Secondary | ICD-10-CM | POA: Diagnosis not present

## 2018-11-08 DIAGNOSIS — N958 Other specified menopausal and perimenopausal disorders: Secondary | ICD-10-CM | POA: Diagnosis not present

## 2018-11-08 DIAGNOSIS — M8588 Other specified disorders of bone density and structure, other site: Secondary | ICD-10-CM | POA: Diagnosis not present

## 2018-11-16 DIAGNOSIS — H2511 Age-related nuclear cataract, right eye: Secondary | ICD-10-CM | POA: Diagnosis not present

## 2018-11-16 DIAGNOSIS — H25811 Combined forms of age-related cataract, right eye: Secondary | ICD-10-CM | POA: Diagnosis not present

## 2018-12-01 DIAGNOSIS — M47819 Spondylosis without myelopathy or radiculopathy, site unspecified: Secondary | ICD-10-CM | POA: Diagnosis not present

## 2018-12-01 DIAGNOSIS — M47812 Spondylosis without myelopathy or radiculopathy, cervical region: Secondary | ICD-10-CM | POA: Diagnosis not present

## 2018-12-01 DIAGNOSIS — M9903 Segmental and somatic dysfunction of lumbar region: Secondary | ICD-10-CM | POA: Diagnosis not present

## 2018-12-01 DIAGNOSIS — S29012A Strain of muscle and tendon of back wall of thorax, initial encounter: Secondary | ICD-10-CM | POA: Diagnosis not present

## 2018-12-01 DIAGNOSIS — M9902 Segmental and somatic dysfunction of thoracic region: Secondary | ICD-10-CM | POA: Diagnosis not present

## 2018-12-01 DIAGNOSIS — M9901 Segmental and somatic dysfunction of cervical region: Secondary | ICD-10-CM | POA: Diagnosis not present

## 2018-12-29 DIAGNOSIS — M47812 Spondylosis without myelopathy or radiculopathy, cervical region: Secondary | ICD-10-CM | POA: Diagnosis not present

## 2018-12-29 DIAGNOSIS — M47819 Spondylosis without myelopathy or radiculopathy, site unspecified: Secondary | ICD-10-CM | POA: Diagnosis not present

## 2018-12-29 DIAGNOSIS — S29012A Strain of muscle and tendon of back wall of thorax, initial encounter: Secondary | ICD-10-CM | POA: Diagnosis not present

## 2018-12-29 DIAGNOSIS — M9902 Segmental and somatic dysfunction of thoracic region: Secondary | ICD-10-CM | POA: Diagnosis not present

## 2018-12-29 DIAGNOSIS — M9903 Segmental and somatic dysfunction of lumbar region: Secondary | ICD-10-CM | POA: Diagnosis not present

## 2018-12-29 DIAGNOSIS — M9901 Segmental and somatic dysfunction of cervical region: Secondary | ICD-10-CM | POA: Diagnosis not present

## 2019-01-10 ENCOUNTER — Other Ambulatory Visit: Payer: Self-pay

## 2019-01-22 ENCOUNTER — Ambulatory Visit: Payer: PPO | Admitting: Podiatry

## 2019-01-26 DIAGNOSIS — M545 Low back pain: Secondary | ICD-10-CM | POA: Diagnosis not present

## 2019-01-26 DIAGNOSIS — M25561 Pain in right knee: Secondary | ICD-10-CM | POA: Diagnosis not present

## 2019-02-02 DIAGNOSIS — M545 Low back pain: Secondary | ICD-10-CM | POA: Diagnosis not present

## 2019-02-19 DIAGNOSIS — M545 Low back pain: Secondary | ICD-10-CM | POA: Diagnosis not present

## 2019-02-19 DIAGNOSIS — M79604 Pain in right leg: Secondary | ICD-10-CM | POA: Diagnosis not present

## 2019-02-22 ENCOUNTER — Ambulatory Visit (INDEPENDENT_AMBULATORY_CARE_PROVIDER_SITE_OTHER): Payer: PPO

## 2019-02-22 VITALS — Ht 64.0 in | Wt 128.0 lb

## 2019-02-22 DIAGNOSIS — Z0000 Encounter for general adult medical examination without abnormal findings: Secondary | ICD-10-CM | POA: Diagnosis not present

## 2019-02-22 NOTE — Progress Notes (Signed)
This visit is being conducted via phone call due to the COVID-19 pandemic. This patient has given me verbal consent via phone to conduct this visit, patient states they are participating from their home address. Some vital signs may be absent or patient reported.   Patient identification: identified by name, DOB, and current address.  Location provider: [facility], Office Persons participating in the virtual visit: Audrey Tucker and Franne Forts, LPN.    Subjective:   Audrey Tucker is a 74 y.o. female who presents for Medicare Annual (Subsequent) preventive examination.  Audrey Tucker is doing well at this time. She is working occasionally and remains very active. She enjoys yard work, walking, Designer, television/film set, and online exercise programs.   Review of Systems:  Cardiac Risk Factors include: advanced age (>10men, >77 women)    Objective:     Vitals: Ht 5\' 4"  (1.626 m)   Wt 128 lb (58.1 kg)   BMI 21.97 kg/m   Body mass index is 21.97 kg/m.  Advanced Directives 02/22/2019 12/07/2017 05/16/2015  Does Patient Have a Medical Advance Directive? Yes Yes Yes  Type of Paramedic of [place];Living will - [place]  Does patient want to make changes to medical advance directive? No - Patient declined - -  Copy of [place] in Chart? No - copy requested - -    Tobacco Social History   Tobacco Use  Smoking Status Former Smoker  . Quit date: 04/05/1967  . Years since quitting: 51.9  Smokeless Tobacco Never Used  Tobacco Comment   rare use in college      Counseling given: Not Answered Comment: rare use in college    Clinical Intake:  Pre-visit preparation completed: Yes  Pain : No/denies pain     BMI - recorded: 21.97 Nutritional Status: BMI of 19-24  Normal Nutritional Risks: None Diabetes: No  How often do you need to have someone help you when you read instructions, pamphlets, or other written materials  from your doctor or pharmacy?: 1 - Never What is the last grade level you completed in school?: 4 years college  Interpreter Needed?: No  Information entered by :: Franne Forts, LPN.  Past Medical History:  Diagnosis Date  . Adenomatous colon polyp    tubular  . Arthritis   . Chicken pox   . Diverticulosis   . Frequent headaches   . PONV (postoperative nausea and vomiting)   . Seasonal allergies    Past Surgical History:  Procedure Laterality Date  . EYE SURGERY Bilateral    cataract  . FOOT NEUROMA SURGERY Right 04-29-15   Dr Paulla Dolly  . SHOULDER ARTHROSCOPY Left    Family History  Problem Relation Age of Onset  . Arthritis Mother   . Hypertension Mother   . Arthritis Father   . Cancer Father        lung   Social History   Socioeconomic History  . Marital status: Married    Spouse name: Not on file  . Number of children: 0  . Years of education: 16  . Highest education level: Bachelor's degree (e.g., BA, AB, BS)  Occupational History  . Occupation: works occasionally   Tobacco Use  . Smoking status: Former Smoker    Quit date: 04/05/1967    Years since quitting: 51.9  . Smokeless tobacco: Never Used  . Tobacco comment: rare use in college   Substance and Sexual Activity  . Alcohol use: Yes  Comment: social  . Drug use: No  . Sexual activity: Not on file  Other Topics Concern  . Not on file  Social History Narrative   Married   No children   HH 2   Yard work, Designer, television/film set   Works occasionally    Social Determinants of Radio broadcast assistant Strain: [place]   . Difficulty of Paying Living Expenses: Not hard at all  Food Insecurity: Unknown  . Worried About Charity fundraiser in the Last Year: Not on file  . Ran Out of Food in the Last Year: Never true  Transportation Needs: No Transportation Needs  . Lack of Transportation (Medical): No  . Lack of Transportation (Non-Medical): No  Physical Activity: Sufficiently Active  . Days of  Exercise per Week: 7 days  . Minutes of Exercise per Session: 50 min  Stress: No Stress Concern Present  . Feeling of Stress : Not at all  Social Connections:   . Frequency of Communication with Friends and Family: Not on file  . Frequency of Social Gatherings with Friends and Family: Not on file  . Attends Religious Services: Not on file  . Active Member of Clubs or Organizations: Not on file  . Attends Archivist Meetings: Not on file  . Marital Status: Not on file    Outpatient Encounter Medications as of 02/22/2019  Medication Sig  . LORazepam (ATIVAN) 0.5 MG tablet TAKE 1 TABLET BY MOUTH AS NEEDED FOR ANXIETY  . PARoxetine Mesylate 7.5 MG CAPS Take 1 tablet daily by mouth.  . methocarbamol (ROBAXIN) 500 MG tablet Take 1 tablet (500 mg total) by mouth 3 (three) times daily. (Patient not taking: Reported on 02/22/2019)   Facility-Administered Encounter Medications as of 02/22/2019  Medication  . 0.9 %  sodium chloride infusion    Activities of Daily Living In your present state of health, do you have any difficulty performing the following activities: 02/22/2019  Hearing? N  Vision? N  Difficulty concentrating or making decisions? N  Walking or climbing stairs? N  Dressing or bathing? N  Doing errands, shopping? N  Preparing Food and eating ? N  Using the Toilet? N  In the past six months, have you accidently leaked urine? N  Do you have problems with loss of bowel control? N  Managing your Medications? N  Managing your Finances? N  Housekeeping or managing your Housekeeping? N  Some recent data might be hidden    Patient Care Team: [place], Betty G, MD as PCP - General (Family Medicine)    Assessment:   This is a routine wellness examination for [place].  Exercise Activities and Dietary recommendations Current Exercise Habits: Home exercise routine, Type of exercise: calisthenics;walking;Other - see comments(pickel ball), Time (Minutes): 45, Frequency (Times/Week):  7, Weekly Exercise (Minutes/Week): 315, Intensity: Moderate, Exercise limited by: None identified  Goals   None     Fall Risk Fall Risk  02/22/2019 01/10/2019 12/07/2017  Falls in the past year? 0 0 No  Comment - Emmi Telephone Survey: data to providers prior to load -  Risk for fall due to : Medication side effect - -  Follow up Falls evaluation completed;Education provided;Falls prevention discussed - -   Is the patient's home free of loose throw rugs in walkways, pet beds, electrical cords, etc?   yes      Grab bars in the bathroom? yes      Handrails on the stairs?   yes  Adequate lighting?   yes  Timed Get Up and Go performed: N/A due to telephone vist.   Depression Screen PHQ 2/9 Scores 02/22/2019 12/07/2017  PHQ - 2 Score 0 0     Cognitive Function     6CIT Screen 02/22/2019  What Year? 0 points  What month? 0 points  What time? 0 points  Count back from 20 0 points  Months in reverse 0 points  Repeat phrase 0 points  Total Score 0    Immunization History  Administered Date(s) Administered  . Influenza Whole 12/14/2006  . Influenza-Unspecified 11/29/2013  . Tdap 02/15/2014    Qualifies for Shingles Vaccine?Yes however she declines at this time.   Screening Tests Health Maintenance  Topic Date Due  . PNA vac Low Risk Adult (1 of 2 - PCV13) 12/16/2027 (Originally 10/23/2010)  . INFLUENZA VACCINE  12/15/2028 (Originally 09/16/2018)  . MAMMOGRAM  09/16/2019  . COLONOSCOPY  03/01/2022  . TETANUS/TDAP  02/16/2024  . DEXA SCAN  Completed  . Hepatitis C Screening  Completed    Cancer Screenings: Lung: Low Dose CT Chest recommended if Age 12-80 years, 30 pack-year currently smoking OR have quit w/in 15years. Patient does not qualify. Breast:  Up to date on Mammogram? Yes   Up to date of Bone Density/Dexa? Yes Colorectal: yes  Additional Screenings:  Hepatitis C Screening: completed 01/11/2018.      Plan:   Mrs. Mcmanaway declines influenza and shingles  vaccines. I was able to schedule a nurse visit for her to come in for pneumonia vaccine. She reports that Dr. Lynnette Caffey continues to follow her mammogram and DEXA scans. She states these were both completed in October 2020 although results have not been forwarded to [place]. She was instructed to begin Calcium/Vitamin D because the results had worsened.    I have personally reviewed and noted the following in the patient's chart:   . Medical and social history . Use of alcohol, tobacco or illicit drugs  . Current medications and supplements . Functional ability and status . Nutritional status . Physical activity . Advanced directives . List of other physicians . Hospitalizations, surgeries, and ER visits in previous 12 months . Vitals . Screenings to include cognitive, depression, and falls . Referrals and appointments  In addition, I have reviewed and discussed with patient certain preventive protocols, quality metrics, and best practice recommendations. A written personalized care plan for preventive services as well as general preventive health recommendations were provided to patient.     Franne Forts, LPN  [phone]

## 2019-02-22 NOTE — Patient Instructions (Addendum)
[name] , Thank you for taking time to participate in your Medicare Wellness Visit. I appreciate your ongoing commitment to your health goals. Please review the following plan we discussed and let me know if I can assist you in the future.   Screening recommendations/referrals: Colorectal Screening: colonoscopy 03/01/2017; due again 03/02/2022. Mammogram: patient reports this was done in Oct 2020 and is followed by Dr. Lynnette Caffey. Bone Density: patient reports this was done in Oct 2020 and is followed by Dr. Lynnette Caffey.   Vision and Dental Exams: Recommended annual ophthalmology exams for early detection of glaucoma and other disorders of the eye Recommended annual dental exams for proper oral hygiene  Diabetic Exams: Diabetic Eye Exam: N/A Diabetic Foot Exam: N/A  Vaccinations: Influenza vaccine: patient declines Pneumococcal vaccine: scheduled nurse visit on 02/27/19 to receive. Tdap vaccine:   Performed 2016; due again 2026. Shingles vaccine: patient declines but information provided on Shingrix.  Advanced directives: Advance directives discussed with you today.  Please bring a copy of your POA (Power of [place]) and/or Living Will to your next appointment.  Goals: Recommend to drink at least 6-8 8oz glasses of water per day.  Continue to exercise (moderate intensity) for at least 150 minutes per week.  Next appointment: Please schedule your Annual Wellness Visit with your Nurse Health Advisor in one year.  Preventive Care 40 Years and Older, Female Preventive care refers to lifestyle choices and visits with your health care provider that can promote health and wellness. What does preventive care include?  A yearly physical exam. This is also called an annual well check.  Dental exams once or twice a year.  Routine eye exams. Ask your health care provider how often you should have your eyes checked.  Personal lifestyle choices, including:  Daily care of your teeth and  gums.  Regular physical activity.  Eating a healthy diet.  Avoiding tobacco and drug use.  Limiting alcohol use.  Practicing safe sex.  Taking low-dose aspirin every day if recommended by your health care provider.  Taking vitamin and mineral supplements as recommended by your health care provider. What happens during an annual well check? The services and screenings done by your health care provider during your annual well check will depend on your age, overall health, lifestyle risk factors, and family history of disease. Counseling  Your health care provider may ask you questions about your:  Alcohol use.  Tobacco use.  Drug use.  Emotional well-being.  Home and relationship well-being.  Sexual activity.  Eating habits.  History of falls.  Memory and ability to understand (cognition).  Work and work Statistician.  Reproductive health. Screening  You may have the following tests or measurements:  Height, weight, and BMI.  Blood pressure.  Lipid and cholesterol levels. These may be checked every 5 years, or more frequently if you are over 31 years old.  Skin check.  Lung cancer screening. You may have this screening every year starting at age 86 if you have a 30-pack-year history of smoking and currently smoke or have quit within the past 15 years.  Fecal occult blood test (FOBT) of the stool. You may have this test every year starting at age 71.  Flexible sigmoidoscopy or colonoscopy. You may have a sigmoidoscopy every 5 years or a colonoscopy every 10 years starting at age 52.  Hepatitis C blood test.  Hepatitis B blood test.  Sexually transmitted disease (STD) testing.  Diabetes screening. This is done by checking your blood sugar (  glucose) after you have not eaten for a while (fasting). You may have this done every 1-3 years.  Bone density scan. This is done to screen for osteoporosis. You may have this done starting at age 62.  Mammogram. This  may be done every 1-2 years. Talk to your health care provider about how often you should have regular mammograms. Talk with your health care provider about your test results, treatment options, and if necessary, the need for more tests. Vaccines  Your health care provider may recommend certain vaccines, such as:  Influenza vaccine. This is recommended every year.  Tetanus, diphtheria, and acellular pertussis (Tdap, Td) vaccine. You may need a Td booster every 10 years.  Zoster vaccine. You may need this after age 64.  Pneumococcal 13-valent conjugate (PCV13) vaccine. One dose is recommended after age 73.  Pneumococcal polysaccharide (PPSV23) vaccine. One dose is recommended after age 20. Talk to your health care provider about which screenings and vaccines you need and how often you need them. This information is not intended to replace advice given to you by your health care provider. Make sure you discuss any questions you have with your health care provider. Document Released: 02/28/2015 Document Revised: 10/22/2015 Document Reviewed: 12/03/2014 Elsevier Interactive Patient Education  2017 [employer] Prevention in the Home Falls can cause injuries. They can happen to people of all ages. There are many things you can do to make your home safe and to help prevent falls. What can I do on the outside of my home?  Regularly fix the edges of walkways and driveways and fix any cracks.  Remove anything that might make you trip as you walk through a door, such as a raised step or threshold.  Trim any bushes or trees on the path to your home.  Use bright outdoor lighting.  Clear any walking paths of anything that might make someone trip, such as rocks or tools.  Regularly check to see if handrails are loose or broken. Make sure that both sides of any steps have handrails.  Any raised decks and porches should have guardrails on the edges.  Have any leaves, snow, or ice cleared  regularly.  Use sand or salt on walking paths during winter.  Clean up any spills in your garage right away. This includes oil or grease spills. What can I do in the bathroom?  Use night lights.  Install grab bars by the toilet and in the tub and shower. Do not use towel bars as grab bars.  Use non-skid mats or decals in the tub or shower.  If you need to sit down in the shower, use a plastic, non-slip stool.  Keep the floor dry. Clean up any water that spills on the floor as soon as it happens.  Remove soap buildup in the tub or shower regularly.  Attach bath mats securely with double-sided non-slip rug tape.  Do not have throw rugs and other things on the floor that can make you trip. What can I do in the bedroom?  Use night lights.  Make sure that you have a light by your bed that is easy to reach.  Do not use any sheets or blankets that are too big for your bed. They should not hang down onto the floor.  Have a firm chair that has side arms. You can use this for support while you get dressed.  Do not have throw rugs and other things on the floor that can make  you trip. What can I do in the kitchen?  Clean up any spills right away.  Avoid walking on wet floors.  Keep items that you use a lot in easy-to-reach places.  If you need to reach something above you, use a strong step stool that has a grab bar.  Keep electrical cords out of the way.  Do not use floor polish or wax that makes floors slippery. If you must use wax, use non-skid floor wax.  Do not have throw rugs and other things on the floor that can make you trip. What can I do with my stairs?  Do not leave any items on the stairs.  Make sure that there are handrails on both sides of the stairs and use them. Fix handrails that are broken or loose. Make sure that handrails are as long as the stairways.  Check any carpeting to make sure that it is firmly attached to the stairs. Fix any carpet that is loose  or worn.  Avoid having throw rugs at the top or bottom of the stairs. If you do have throw rugs, attach them to the floor with carpet tape.  Make sure that you have a light switch at the top of the stairs and the bottom of the stairs. If you do not have them, ask someone to add them for you. What else can I do to help prevent falls?  Wear shoes that:  Do not have high heels.  Have rubber bottoms.  Are comfortable and fit you well.  Are closed at the toe. Do not wear sandals.  If you use a stepladder:  Make sure that it is fully opened. Do not climb a closed stepladder.  Make sure that both sides of the stepladder are locked into place.  Ask someone to hold it for you, if possible.  Clearly mark and make sure that you can see:  Any grab bars or handrails.  First and last steps.  Where the edge of each step is.  Use tools that help you move around (mobility aids) if they are needed. These include:  Canes.  Walkers.  Scooters.  Crutches.  Turn on the lights when you go into a dark area. Replace any light bulbs as soon as they burn out.  Set up your furniture so you have a clear path. Avoid moving your furniture around.  If any of your floors are uneven, fix them.  If there are any pets around you, be aware of where they are.  Review your medicines with your doctor. Some medicines can make you feel dizzy. This can increase your chance of falling. Ask your doctor what other things that you can do to help prevent falls. This information is not intended to replace advice given to you by your health care provider. Make sure you discuss any questions you have with your health care provider. Document Released: 11/28/2008 Document Revised: 07/10/2015 Document Reviewed: 03/08/2014 Elsevier Interactive Patient Education  2017 [employer].

## 2019-02-26 ENCOUNTER — Other Ambulatory Visit: Payer: Self-pay

## 2019-02-27 ENCOUNTER — Ambulatory Visit (INDEPENDENT_AMBULATORY_CARE_PROVIDER_SITE_OTHER): Payer: PPO | Admitting: *Deleted

## 2019-02-27 DIAGNOSIS — Z23 Encounter for immunization: Secondary | ICD-10-CM

## 2019-02-27 NOTE — Progress Notes (Signed)
Per orders of Dr. Martinique, injection of Pneumovax given by Zacarias Pontes. Patient tolerated injection well.

## 2019-03-09 ENCOUNTER — Ambulatory Visit: Payer: PPO | Attending: Internal Medicine

## 2019-03-09 DIAGNOSIS — Z20822 Contact with and (suspected) exposure to covid-19: Secondary | ICD-10-CM

## 2019-03-10 LAB — NOVEL CORONAVIRUS, NAA: SARS-CoV-2, NAA: NOT DETECTED

## 2019-09-27 ENCOUNTER — Ambulatory Visit: Payer: PPO | Admitting: Orthopaedic Surgery

## 2019-09-27 DIAGNOSIS — M7062 Trochanteric bursitis, left hip: Secondary | ICD-10-CM | POA: Diagnosis not present

## 2019-09-27 MED ORDER — LIDOCAINE HCL 1 % IJ SOLN
3.0000 mL | INTRAMUSCULAR | Status: AC | PRN
  Administered 2019-09-27: 3 mL

## 2019-09-27 MED ORDER — METHYLPREDNISOLONE ACETATE 40 MG/ML IJ SUSP
40.0000 mg | INTRAMUSCULAR | Status: AC | PRN
  Administered 2019-09-27: 40 mg via INTRA_ARTICULAR

## 2019-09-27 MED ORDER — METHOCARBAMOL 500 MG PO TABS: 500.0000 mg | ORAL_TABLET | Freq: Three times a day (TID) | ORAL | 1 refills | Status: DC | PRN

## 2019-09-27 MED ORDER — METHOCARBAMOL 500 MG PO TABS: 500.0000 mg | ORAL_TABLET | Freq: Three times a day (TID) | ORAL | 1 refills | Status: DC

## 2019-09-27 NOTE — Progress Notes (Signed)
Office Visit Note   Patient: Audrey Tucker           Date of Birth: 1945-04-26           MRN: [id number] Visit Date: 09/27/2019              Requested by: Martinique, Betty G, MD [address] [place],  [place] [address] PCP: Martinique, Betty G, MD   Assessment & Plan: Visit Diagnoses:  1. Trochanteric bursitis, left hip     Plan: At this point, I did provide her with another steroid injection in her left hip which she tolerated very well.  I do feel that with her home exercise program and time this is going to continue to improve.  She did not need injection of the right side for the first time and I think this is good signs for her and she agrees with this.  All question concerns were answered and addressed.  Follow-up is as needed.  Follow-Up Instructions: Return if symptoms worsen or fail to improve.   Orders:  Orders Placed This Encounter  Procedures  . Large Joint Inj   Meds ordered this encounter  Medications  . DISCONTD: methocarbamol (ROBAXIN) 500 MG tablet    Sig: Take 1 tablet (500 mg total) by mouth 3 (three) times daily.    Dispense:  40 tablet    Refill:  1  . methocarbamol (ROBAXIN) 500 MG tablet    Sig: Take 1 tablet (500 mg total) by mouth every 8 (eight) hours as needed for muscle spasms.    Dispense:  60 tablet    Refill:  1      Procedures: Large Joint Inj: L greater trochanter on 09/27/2019 3:32 PM Indications: pain and diagnostic evaluation Details: 22 G 1.5 in needle, lateral approach  Arthrogram: No  Medications: 3 mL lidocaine 1 %; 40 mg methylPREDNISolone acetate 40 MG/ML Outcome: tolerated well, no immediate complications Procedure, treatment alternatives, risks and benefits explained, specific risks discussed. Consent was given by the patient. Immediately prior to procedure a time out was called to verify the correct patient, procedure, equipment, support staff and site/side marked as required. Patient was prepped and draped in the usual  sterile fashion.       Clinical Data: No additional findings.   Subjective: Chief Complaint  Patient presents with  . Left Hip - Pain  The patient comes in with left hip pain and trochanteric bursitis.  She is at least 2 injections in her left hip and 3 in the right hip.  She is of the right hip is finally coming down.  Is been a very long time since we have injected the left hip.  She states that it flared up after moving and having to go up and down steps a lot well they are packing their house up and moving.  She said no other acute change in her medical status.  She denies any groin pain and only reports pain over the trochanteric area of her hip  HPI  Review of Systems She currently denies any headache, chest pain, shortness of breath, fever, chills, nausea, vomiting  Objective: Vital Signs: There were no vitals taken for this visit.  Physical Exam She is alert and orient x3 and in no acute distress Ortho Exam Examination of both hips show the move smoothly and fluidly.  She has a little bit of pain to palpation over the trochanteric area on both sides worse on the left than the right.  Specialty Comments:  No specialty comments available.  Imaging: No results found.   PMFS History: Patient Active Problem List   Diagnosis Date Noted  . RLS (restless legs syndrome) 12/31/2016  . Trochanteric bursitis of both hips 08/09/2016  . Generalized osteoarthritis of hand 07/27/2016  . Headache, variant migraine 07/26/2016   Past Medical History:  Diagnosis Date  . Adenomatous colon polyp    tubular  . Arthritis   . Chicken pox   . Diverticulosis   . Frequent headaches   . PONV (postoperative nausea and vomiting)   . Seasonal allergies     Family History  Problem Relation Age of Onset  . Arthritis Mother   . Hypertension Mother   . Arthritis Father   . Cancer Father        lung    Past Surgical History:  Procedure Laterality Date  . EYE SURGERY Bilateral     cataract  . FOOT NEUROMA SURGERY Right 04-29-15   Dr Paulla Dolly  . SHOULDER ARTHROSCOPY Left    Social History   Occupational History  . Occupation: works occasionally   Tobacco Use  . Smoking status: Former Smoker    Quit date: 04/05/1967    Years since quitting: 52.5  . Smokeless tobacco: Never Used  . Tobacco comment: rare use in college   Vaping Use  . Vaping Use: Never used  Substance and Sexual Activity  . Alcohol use: Yes    Comment: social  . Drug use: No  . Sexual activity: Not on file

## 2019-10-19 DIAGNOSIS — R1900 Intra-abdominal and pelvic swelling, mass and lump, unspecified site: Secondary | ICD-10-CM | POA: Diagnosis not present

## 2019-10-24 ENCOUNTER — Other Ambulatory Visit: Payer: Self-pay | Admitting: Diagnostic Radiology

## 2019-10-24 ENCOUNTER — Other Ambulatory Visit: Payer: Self-pay | Admitting: Obstetrics & Gynecology

## 2019-10-24 DIAGNOSIS — R1900 Intra-abdominal and pelvic swelling, mass and lump, unspecified site: Secondary | ICD-10-CM

## 2019-11-01 ENCOUNTER — Ambulatory Visit
Admission: RE | Admit: 2019-11-01 | Discharge: 2019-11-01 | Disposition: A | Discharge status: Home / Self Care | Payer: PPO | Source: Ambulatory Visit | Attending: Obstetrics & Gynecology | Admitting: Obstetrics & Gynecology

## 2019-11-01 DIAGNOSIS — K4090 Unilateral inguinal hernia, without obstruction or gangrene, not specified as recurrent: Secondary | ICD-10-CM | POA: Diagnosis not present

## 2019-11-01 DIAGNOSIS — K5790 Diverticulosis of intestine, part unspecified, without perforation or abscess without bleeding: Secondary | ICD-10-CM | POA: Diagnosis not present

## 2019-11-01 DIAGNOSIS — R1900 Intra-abdominal and pelvic swelling, mass and lump, unspecified site: Secondary | ICD-10-CM

## 2019-11-01 DIAGNOSIS — R1909 Other intra-abdominal and pelvic swelling, mass and lump: Secondary | ICD-10-CM | POA: Diagnosis not present

## 2019-12-12 DIAGNOSIS — M9903 Segmental and somatic dysfunction of lumbar region: Secondary | ICD-10-CM | POA: Diagnosis not present

## 2019-12-12 DIAGNOSIS — S39012A Strain of muscle, fascia and tendon of lower back, initial encounter: Secondary | ICD-10-CM | POA: Diagnosis not present

## 2019-12-12 DIAGNOSIS — M47819 Spondylosis without myelopathy or radiculopathy, site unspecified: Secondary | ICD-10-CM | POA: Diagnosis not present

## 2019-12-12 DIAGNOSIS — M9905 Segmental and somatic dysfunction of pelvic region: Secondary | ICD-10-CM | POA: Diagnosis not present

## 2019-12-12 DIAGNOSIS — S29012A Strain of muscle and tendon of back wall of thorax, initial encounter: Secondary | ICD-10-CM | POA: Diagnosis not present

## 2019-12-12 DIAGNOSIS — M9902 Segmental and somatic dysfunction of thoracic region: Secondary | ICD-10-CM | POA: Diagnosis not present

## 2019-12-18 DIAGNOSIS — M9905 Segmental and somatic dysfunction of pelvic region: Secondary | ICD-10-CM | POA: Diagnosis not present

## 2019-12-18 DIAGNOSIS — M9903 Segmental and somatic dysfunction of lumbar region: Secondary | ICD-10-CM | POA: Diagnosis not present

## 2019-12-18 DIAGNOSIS — I788 Other diseases of capillaries: Secondary | ICD-10-CM | POA: Diagnosis not present

## 2019-12-18 DIAGNOSIS — L814 Other melanin hyperpigmentation: Secondary | ICD-10-CM | POA: Diagnosis not present

## 2019-12-18 DIAGNOSIS — L821 Other seborrheic keratosis: Secondary | ICD-10-CM | POA: Diagnosis not present

## 2019-12-18 DIAGNOSIS — D485 Neoplasm of uncertain behavior of skin: Secondary | ICD-10-CM | POA: Diagnosis not present

## 2019-12-18 DIAGNOSIS — M9902 Segmental and somatic dysfunction of thoracic region: Secondary | ICD-10-CM | POA: Diagnosis not present

## 2019-12-18 DIAGNOSIS — Z85828 Personal history of other malignant neoplasm of skin: Secondary | ICD-10-CM | POA: Diagnosis not present

## 2019-12-18 DIAGNOSIS — M47819 Spondylosis without myelopathy or radiculopathy, site unspecified: Secondary | ICD-10-CM | POA: Diagnosis not present

## 2019-12-18 DIAGNOSIS — S29012A Strain of muscle and tendon of back wall of thorax, initial encounter: Secondary | ICD-10-CM | POA: Diagnosis not present

## 2019-12-18 DIAGNOSIS — S39012A Strain of muscle, fascia and tendon of lower back, initial encounter: Secondary | ICD-10-CM | POA: Diagnosis not present

## 2019-12-20 DIAGNOSIS — Z01419 Encounter for gynecological examination (general) (routine) without abnormal findings: Secondary | ICD-10-CM | POA: Diagnosis not present

## 2019-12-20 DIAGNOSIS — Z1231 Encounter for screening mammogram for malignant neoplasm of breast: Secondary | ICD-10-CM | POA: Diagnosis not present

## 2019-12-20 DIAGNOSIS — Z6821 Body mass index (BMI) 21.0-21.9, adult: Secondary | ICD-10-CM | POA: Diagnosis not present

## 2019-12-24 DIAGNOSIS — M47819 Spondylosis without myelopathy or radiculopathy, site unspecified: Secondary | ICD-10-CM | POA: Diagnosis not present

## 2019-12-24 DIAGNOSIS — M9903 Segmental and somatic dysfunction of lumbar region: Secondary | ICD-10-CM | POA: Diagnosis not present

## 2019-12-24 DIAGNOSIS — M9905 Segmental and somatic dysfunction of pelvic region: Secondary | ICD-10-CM | POA: Diagnosis not present

## 2019-12-24 DIAGNOSIS — M9902 Segmental and somatic dysfunction of thoracic region: Secondary | ICD-10-CM | POA: Diagnosis not present

## 2019-12-24 DIAGNOSIS — S39012A Strain of muscle, fascia and tendon of lower back, initial encounter: Secondary | ICD-10-CM | POA: Diagnosis not present

## 2019-12-24 DIAGNOSIS — S29012A Strain of muscle and tendon of back wall of thorax, initial encounter: Secondary | ICD-10-CM | POA: Diagnosis not present

## 2020-01-01 DIAGNOSIS — S29012A Strain of muscle and tendon of back wall of thorax, initial encounter: Secondary | ICD-10-CM | POA: Diagnosis not present

## 2020-01-01 DIAGNOSIS — M9905 Segmental and somatic dysfunction of pelvic region: Secondary | ICD-10-CM | POA: Diagnosis not present

## 2020-01-01 DIAGNOSIS — M9902 Segmental and somatic dysfunction of thoracic region: Secondary | ICD-10-CM | POA: Diagnosis not present

## 2020-01-01 DIAGNOSIS — S39012A Strain of muscle, fascia and tendon of lower back, initial encounter: Secondary | ICD-10-CM | POA: Diagnosis not present

## 2020-01-01 DIAGNOSIS — M47819 Spondylosis without myelopathy or radiculopathy, site unspecified: Secondary | ICD-10-CM | POA: Diagnosis not present

## 2020-01-01 DIAGNOSIS — M9903 Segmental and somatic dysfunction of lumbar region: Secondary | ICD-10-CM | POA: Diagnosis not present

## 2020-01-15 DIAGNOSIS — S39012A Strain of muscle, fascia and tendon of lower back, initial encounter: Secondary | ICD-10-CM | POA: Diagnosis not present

## 2020-01-15 DIAGNOSIS — M9905 Segmental and somatic dysfunction of pelvic region: Secondary | ICD-10-CM | POA: Diagnosis not present

## 2020-01-15 DIAGNOSIS — M9902 Segmental and somatic dysfunction of thoracic region: Secondary | ICD-10-CM | POA: Diagnosis not present

## 2020-01-15 DIAGNOSIS — S29012A Strain of muscle and tendon of back wall of thorax, initial encounter: Secondary | ICD-10-CM | POA: Diagnosis not present

## 2020-01-15 DIAGNOSIS — M47819 Spondylosis without myelopathy or radiculopathy, site unspecified: Secondary | ICD-10-CM | POA: Diagnosis not present

## 2020-01-15 DIAGNOSIS — M9903 Segmental and somatic dysfunction of lumbar region: Secondary | ICD-10-CM | POA: Diagnosis not present

## 2020-02-27 ENCOUNTER — Ambulatory Visit: Payer: PPO | Admitting: Podiatry

## 2020-02-27 ENCOUNTER — Ambulatory Visit (INDEPENDENT_AMBULATORY_CARE_PROVIDER_SITE_OTHER): Payer: PPO

## 2020-02-27 ENCOUNTER — Other Ambulatory Visit: Payer: Self-pay

## 2020-02-27 DIAGNOSIS — M21619 Bunion of unspecified foot: Secondary | ICD-10-CM

## 2020-02-27 DIAGNOSIS — M79672 Pain in left foot: Secondary | ICD-10-CM | POA: Diagnosis not present

## 2020-02-27 DIAGNOSIS — K4090 Unilateral inguinal hernia, without obstruction or gangrene, not specified as recurrent: Secondary | ICD-10-CM | POA: Diagnosis not present

## 2020-02-27 DIAGNOSIS — M722 Plantar fascial fibromatosis: Secondary | ICD-10-CM | POA: Diagnosis not present

## 2020-02-27 MED ORDER — TRIAMCINOLONE ACETONIDE 10 MG/ML IJ SUSP
10.0000 mg | Freq: Once | INTRAMUSCULAR | Status: AC
  Administered 2020-02-27: 10 mg

## 2020-02-27 NOTE — Progress Notes (Signed)
Subjective:   Patient ID: Audrey Tucker, female   DOB: 75 y.o.   MRN: [id number]   HPI Patient presents stating she has developed pain in her left foot and stated she was thinking may be gout as it got quite red and swollen and now is quite painful.  Does not remember specific injury and patient does not smoke and does like to be active   Review of Systems  All other systems reviewed and are negative.       Objective:  Physical Exam Vitals and nursing note reviewed.  Constitutional:      Appearance: She is well-developed and well-nourished.  Cardiovascular:     Pulses: Intact distal pulses.  Pulmonary:     Effort: Pulmonary effort is normal.  Musculoskeletal:        General: Normal range of motion.  Skin:    General: Skin is warm.  Neurological:     Mental Status: She is alert.     Neurovascular status was found to be intact muscle strength was found to be adequate range of motion adequate.  Patient has exquisite discomfort in the plantar aspect of the first metatarsal just at the distal insertion of the plantar fascia with no current redness or swelling to the big toe joint with moderate bunion deformity.  Patient is found to have good digital perfusion well oriented x3     Assessment:  Inflammatory distal fasciitis left with pain     Plan:  H&P reviewed condition did sterile prep and injected the distal fascia 3 mg Kenalog 5 mg Xylocaine and instructed on ice therapy and applied padding to reduce pressure on the joint surface.  Patient will be seen back to recheck if symptoms persist  X-rays indicate that there is no signs of fracture or bone issue associated with this appears to be soft tissue

## 2020-04-21 ENCOUNTER — Ambulatory Visit: Payer: PPO

## 2020-05-07 NOTE — Progress Notes (Unsigned)
Subjective:   Audrey Tucker is a 75 y.o. female who presents for Medicare Annual (Subsequent) preventive examination.  Review of Systems    N/A        Objective:    There were no vitals filed for this visit. There is no height or weight on file to calculate BMI.  Advanced Directives 02/22/2019 12/07/2017 05/16/2015  Does Patient Have a Medical Advance Directive? Yes Yes Yes  Type of Paramedic of [place];Living will - [place]  Does patient want to make changes to medical advance directive? No - Patient declined - -  Copy of [place] in Chart? No - copy requested - -    Current Medications (verified) Outpatient Encounter Medications as of 05/08/2020  Medication Sig  . calcium-vitamin D 250-100 MG-UNIT tablet Take 1 tablet by mouth 2 (two) times daily.  Marland Kitchen LORazepam (ATIVAN) 0.5 MG tablet TAKE 1 TABLET BY MOUTH AS NEEDED FOR ANXIETY  . methocarbamol (ROBAXIN) 500 MG tablet Take 1 tablet (500 mg total) by mouth every 8 (eight) hours as needed for muscle spasms.  Marland Kitchen PARoxetine Mesylate 7.5 MG CAPS Take 1 tablet daily by mouth.   Facility-Administered Encounter Medications as of 05/08/2020  Medication  . 0.9 %  sodium chloride infusion    Allergies (verified) No known allergies   History: Past Medical History:  Diagnosis Date  . Adenomatous colon polyp    tubular  . Arthritis   . Chicken pox   . Diverticulosis   . Frequent headaches   . PONV (postoperative nausea and vomiting)   . Seasonal allergies    Past Surgical History:  Procedure Laterality Date  . EYE SURGERY Bilateral    cataract  . FOOT NEUROMA SURGERY Right 04-29-15   Dr Paulla Dolly  . SHOULDER ARTHROSCOPY Left    Family History  Problem Relation Age of Onset  . Arthritis Mother   . Hypertension Mother   . Arthritis Father   . Cancer Father        lung   Social History   Socioeconomic History  . Marital status: Married    Spouse name:  Not on file  . Number of children: 0  . Years of education: 16  . Highest education level: Bachelor's degree (e.g., BA, AB, BS)  Occupational History  . Occupation: works occasionally   Tobacco Use  . Smoking status: Former Smoker    Quit date: 04/05/1967    Years since quitting: 53.1  . Smokeless tobacco: Never Used  . Tobacco comment: rare use in college   Vaping Use  . Vaping Use: Never used  Substance and Sexual Activity  . Alcohol use: Yes    Comment: social  . Drug use: No  . Sexual activity: Not on file  Other Topics Concern  . Not on file  Social History Narrative   Married   No children   HH 2   Yard work, Designer, television/film set   Works occasionally    Social Determinants of Radio broadcast assistant Strain: Not on Art therapist Insecurity: Not on file  Transportation Needs: Not on file  Physical Activity: Not on file  Stress: Not on file  Social Connections: Not on file    Tobacco Counseling Counseling given: Not Answered Comment: rare use in college    Clinical Intake:                 Diabetic?No  Activities of Daily Living No flowsheet data found.  Patient Care Team: Martinique, Betty G, MD as PCP - General (Family Medicine)  Indicate any recent Medical Services you may have received from other than [facility] providers in the past year (date may be approximate).     Assessment:   This is a routine wellness examination for [place].  Hearing/Vision screen No exam data present  Dietary issues and exercise activities discussed:    Goals    . Patient Stated     Patient is looking forward to resuming indoor pickle ball when the pandemic calms.       Depression Screen PHQ 2/9 Scores 02/22/2019 12/07/2017  PHQ - 2 Score 0 0    Fall Risk Fall Risk  02/22/2019 01/10/2019 12/07/2017  Falls in the past year? 0 0 No  Comment - Emmi Telephone Survey: data to providers prior to load -  Risk for fall due to : Medication side effect - -  Follow up  Falls evaluation completed;Education provided;Falls prevention discussed - -    FALL RISK PREVENTION PERTAINING TO THE HOME:  Any stairs in or around the home? {YES/NO:21197} If so, are there any without handrails? No  Home free of loose throw rugs in walkways, pet beds, electrical cords, etc? Yes  Adequate lighting in your home to reduce risk of falls? Yes   ASSISTIVE DEVICES UTILIZED TO PREVENT FALLS:  Life alert? {YES/NO:21197} Use of a cane, walker or w/c? {YES/NO:21197} Grab bars in the bathroom? {YES/NO:21197} Shower chair or bench in shower? {YES/NO:21197} Elevated toilet seat or a handicapped toilet? {YES/NO:21197}  TIMED UP AND GO:  Was the test performed? Yes .  Length of time to ambulate 10 feet: *** sec.   {Appearance of LEXN:1700174}  Cognitive Function:     6CIT Screen 02/22/2019  What Year? 0 points  What month? 0 points  What time? 0 points  Count back from 20 0 points  Months in reverse 0 points  Repeat phrase 0 points  Total Score 0    Immunizations Immunization History  Administered Date(s) Administered  . Influenza Whole 12/14/2006  . Influenza-Unspecified 11/29/2013  . Pneumococcal Polysaccharide-23 02/27/2019  . Tdap 02/15/2014    TDAP status: Up to date  {Flu Vaccine status:2101806}  {Pneumococcal vaccine status:2101807}  {Covid-19 vaccine status:2101808}  Qualifies for Shingles Vaccine? Yes   Zostavax completed No   Shingrix Completed?: No.    Education has been provided regarding the importance of this vaccine. Patient has been advised to call insurance company to determine out of pocket expense if they have not yet received this vaccine. Advised may also receive vaccine at local pharmacy or Health Dept. Verbalized acceptance and understanding.  Screening Tests Health Maintenance  Topic Date Due  . COVID-19 Vaccine (1) Never done  . MAMMOGRAM  09/16/2019  . PNA vac Low Risk Adult (2 of 2 - PCV13) 02/27/2020  . INFLUENZA VACCINE   12/15/2028 (Originally 09/16/2019)  . COLONOSCOPY (Pts 45-4yrs Insurance coverage will need to be confirmed)  03/01/2022  . TETANUS/TDAP  02/16/2024  . DEXA SCAN  Completed  . Hepatitis C Screening  Completed  . HPV VACCINES  Aged Out    Health Maintenance  Health Maintenance Due  Topic Date Due  . COVID-19 Vaccine (1) Never done  . MAMMOGRAM  09/16/2019  . PNA vac Low Risk Adult (2 of 2 - PCV13) 02/27/2020    Colorectal cancer screening: Type of screening: Colonoscopy. Completed 03/01/2017. Repeat every 5 years  Mammogram status:  Ordered 05/08/2020. Pt provided with contact info and advised to call to schedule appt.   {Bone Density status:21018021}  Lung Cancer Screening: (Low Dose CT Chest recommended if Age 54-80 years, 30 pack-year currently smoking OR have quit w/in 15years.) does not qualify.   Lung Cancer Screening Referral: N/A    Additional Screening:  Hepatitis C Screening: does qualify; Completed 01/11/2018  Vision Screening: Recommended annual ophthalmology exams for early detection of glaucoma and other disorders of the eye. Is the patient up to date with their annual eye exam?  {YES/NO:21197} Who is the provider or what is the name of the office in which the patient attends annual eye exams? *** If pt is not established with a provider, would they like to be referred to a provider to establish care? {YES/NO:21197}.   Dental Screening: Recommended annual dental exams for proper oral hygiene  Community Resource Referral / Chronic Care Management: CRR required this visit?  No   CCM required this visit?  No      Plan:     I have personally reviewed and noted the following in the patient's chart:   . Medical and social history . Use of alcohol, tobacco or illicit drugs  . Current medications and supplements . Functional ability and status . Nutritional status . Physical activity . Advanced directives . List of other physicians . Hospitalizations,  surgeries, and ER visits in previous 12 months . Vitals . Screenings to include cognitive, depression, and falls . Referrals and appointments  In addition, I have reviewed and discussed with patient certain preventive protocols, quality metrics, and best practice recommendations. A written personalized care plan for preventive services as well as general preventive health recommendations were provided to patient.     Ofilia Neas, LPN   03/05/4172   Nurse Notes: None

## 2020-05-08 ENCOUNTER — Ambulatory Visit: Payer: PPO

## 2020-05-08 DIAGNOSIS — Z0000 Encounter for general adult medical examination without abnormal findings: Secondary | ICD-10-CM

## 2020-05-15 ENCOUNTER — Other Ambulatory Visit: Payer: Self-pay

## 2020-05-16 ENCOUNTER — Encounter: Payer: Self-pay | Admitting: Family Medicine

## 2020-05-16 ENCOUNTER — Ambulatory Visit (INDEPENDENT_AMBULATORY_CARE_PROVIDER_SITE_OTHER): Payer: PPO | Admitting: Family Medicine

## 2020-05-16 VITALS — BP 110/70 | HR 78 | Resp 16 | Ht 64.0 in | Wt 128.4 lb

## 2020-05-16 DIAGNOSIS — G4700 Insomnia, unspecified: Secondary | ICD-10-CM | POA: Diagnosis not present

## 2020-05-16 DIAGNOSIS — J302 Other seasonal allergic rhinitis: Secondary | ICD-10-CM | POA: Insufficient documentation

## 2020-05-16 DIAGNOSIS — G2581 Restless legs syndrome: Secondary | ICD-10-CM

## 2020-05-16 DIAGNOSIS — I700 Atherosclerosis of aorta: Secondary | ICD-10-CM | POA: Diagnosis not present

## 2020-05-16 DIAGNOSIS — R5383 Other fatigue: Secondary | ICD-10-CM | POA: Diagnosis not present

## 2020-05-16 LAB — LIPID PANEL
Cholesterol: 204 mg/dL — ABNORMAL HIGH (ref 0–200)
HDL: 68 mg/dL (ref >39.00)
LDL Cholesterol: 122 mg/dL — ABNORMAL HIGH (ref 0–99)
NonHDL: 135.57
Total CHOL/HDL Ratio: 3
Triglycerides: 67 mg/dL (ref 0.0–149.0)
VLDL: 13.4 mg/dL (ref 0.0–40.0)

## 2020-05-16 LAB — CBC
HCT: 37.3 % (ref 36.0–46.0)
Hemoglobin: 12.5 g/dL (ref 12.0–15.0)
MCHC: 33.6 g/dL (ref 30.0–36.0)
MCV: 95.6 fl (ref 78.0–100.0)
Platelets: 212 10*3/uL (ref 150.0–400.0)
RBC: 3.9 Mil/uL (ref 3.87–5.11)
RDW: 13.4 % (ref 11.5–15.5)
WBC: 5.7 10*3/uL (ref 4.0–10.5)

## 2020-05-16 LAB — COMPREHENSIVE METABOLIC PANEL
ALT: 13 U/L (ref 0–35)
AST: 21 U/L (ref 0–37)
Albumin: 4.2 g/dL (ref 3.5–5.2)
Alkaline Phosphatase: 63 U/L (ref 39–117)
BUN: 18 mg/dL (ref 6–23)
CO2: 28 mEq/L (ref 19–32)
Calcium: 9.5 mg/dL (ref 8.4–10.5)
Chloride: 104 mEq/L (ref 96–112)
Creatinine, Ser: 0.63 mg/dL (ref 0.40–1.20)
GFR: 87.3 mL/min (ref >60.00)
Glucose, Bld: 90 mg/dL (ref 70–99)
Potassium: 4.5 mEq/L (ref 3.5–5.1)
Sodium: 139 mEq/L (ref 135–145)
Total Bilirubin: 0.4 mg/dL (ref 0.2–1.2)
Total Protein: 6.5 g/dL (ref 6.0–8.3)

## 2020-05-16 LAB — TSH: TSH: 2.1 u[IU]/mL (ref 0.35–4.50)

## 2020-05-16 MED ORDER — LORAZEPAM 0.5 MG PO TABS: ORAL_TABLET | ORAL | 0 refills | Status: DC

## 2020-05-16 NOTE — Patient Instructions (Signed)
A few things to remember from today's visit:   RLS (restless legs syndrome) - Plan: LORazepam (ATIVAN) 0.5 MG tablet  Fatigue, unspecified type - Plan: Comprehensive metabolic panel, CBC, TSH  Abdominal aortic atherosclerosis (HCC) - Plan: Lipid panel  Insomnia, unspecified type  If you need refills please call your pharmacy. Do not use My Chart to request refills or for acute issues that need immediate attention.  Fatigue is a common symptom associated with multiple factors: psychologic,medications, systemic illness, sleep disorders,infections, and unknown causes. Some work-up can be done to evaluate for common causes as thyroid disease,anemia,diabetes, or abnormalities in calcium,potassium,or sodium. Regular physical activity as tolerated and a healthy diet is usually might help and usually recommended for chronic fatigue.   Please be sure medication list is accurate. If a new problem present, please set up appointment sooner than planned today.

## 2020-05-16 NOTE — Progress Notes (Signed)
HPI: Audrey Tucker is a 75 y.o. female, who is here today for follow up.   She was last seen on 01/11/18. Since her last visit she has followed with her gyn. Dx'ed with left inguinal hernia, has seen surgeon. She is planning on having hernia repair in 09/2020. She is not having pain.  Lorazepam 1/2 tab at bedtime about once per months mainly to help with for RLS. Dr Lynnette Caffey prescribed Paroxetine 10 mg  for hot flashes. Negative for anxiety or depression.  Aortic atherosclerosis seen on imaging,pelvic CT done in 10/2019.Vascular/Lymphatic: Moderate atherosclerotic calcifications. No suspicious lymphadenopathy. No hx of HTN,HLD,or diabetes. She is not on statin or Aspirin.  A month ago she started with left ear fullness sensation and dizziness, attributed to "sinuses." "Minor" right frontal pressure headache. Symptoms have improved. Hx of migraine.  Fatigue for the past 2 months. Negative for fever,abnormal wt loss,night sweats, cough,wheezing,CP,myalgias,or urinary symptoms.  She does not sleep well but this is not a new problem. She has difficulty falling asleep. Sleeps about 5-6 hours. She has not tried OTC treatments. She does not feel rested. Negative for witnessed sleep apnea.  She has been under some stress. [place] and has been driving back and fourth,cleaning. She also sold her house, it was fast, had to move. She has not found a new house to buy.  She is renting one of her friend's house for now.  Colonoscopy on 03/01/2017.  Negative for abdominal pain, nausea, vomiting, changes in bowel habits, blood in stool or melena.  Review of Systems  Constitutional: Negative for activity change and appetite change.  HENT: Negative for mouth sores, nosebleeds and sore throat.   Eyes: Negative for redness and visual disturbance.  Respiratory: Negative for cough and wheezing.   Cardiovascular: Negative for palpitations and leg swelling.   Endocrine: Negative for cold intolerance and heat intolerance.  Genitourinary: Negative for decreased urine volume, dysuria and hematuria.  Skin: Negative for pallor and rash.  Allergic/Immunologic: Positive for environmental allergies.  Neurological: Negative for syncope, facial asymmetry and weakness.  Hematological: Negative for adenopathy. Does not bruise/bleed easily.  Psychiatric/Behavioral: Positive for sleep disturbance. Negative for behavioral problems and confusion.  Rest of ROS, see pertinent positives sand negatives in HPI  Current Outpatient Medications on File Prior to Visit  Medication Sig Dispense Refill  . calcium-vitamin D 250-100 MG-UNIT tablet Take 1 tablet by mouth 2 (two) times daily.    . methocarbamol (ROBAXIN) 500 MG tablet Take 1 tablet (500 mg total) by mouth every 8 (eight) hours as needed for muscle spasms. 60 tablet 1  . PARoxetine (PAXIL) 10 MG tablet Take 10 mg by mouth daily.     Current Facility-Administered Medications on File Prior to Visit  Medication Dose Route Frequency Provider Last Rate Last Admin  . 0.9 %  sodium chloride infusion  500 mL Intravenous Once Irene Shipper, MD       Past Medical History:  Diagnosis Date  . Adenomatous colon polyp    tubular  . Arthritis   . Chicken pox   . Diverticulosis   . Frequent headaches   . PONV (postoperative nausea and vomiting)   . Seasonal allergies    Allergies  Allergen Reactions  . No Known Allergies    Social History   Socioeconomic History  . Marital status: Married    Spouse name: Not on file  . Number of children: 0  . Years of education: 35  .  Highest education level: Bachelor's degree (e.g., BA, AB, BS)  Occupational History  . Occupation: works occasionally   Tobacco Use  . Smoking status: Former Smoker    Quit date: 04/05/1967    Years since quitting: 53.1  . Smokeless tobacco: Never Used  . Tobacco comment: rare use in college   Vaping Use  . Vaping Use: Never used   Substance and Sexual Activity  . Alcohol use: Yes    Comment: social  . Drug use: No  . Sexual activity: Not on file  Other Topics Concern  . Not on file  Social History Narrative   Married   No children   HH 2   Yard work, Designer, television/film set   Works occasionally    Social Determinants of Radio broadcast assistant Strain: Not on [employer] Insecurity: Not on file  Transportation Needs: Not on file  Physical Activity: Not on file  Stress: Not on file  Social Connections: Not on file    Vitals:   05/16/20 0920  BP: 110/70  Pulse: 78  Resp: 16  SpO2: 97%   Body mass index is 22.04 kg/m.  Physical Exam Vitals and nursing note reviewed.  Constitutional:      General: She is not in acute distress.    Appearance: She is well-developed and normal weight.  HENT:     Head: Normocephalic and atraumatic.     Mouth/Throat:     Mouth: Mucous membranes are moist.     Pharynx: Oropharynx is clear.  Eyes:     Conjunctiva/sclera: Conjunctivae normal.  Cardiovascular:     Rate and Rhythm: Normal rate and regular rhythm.     Pulses:          Dorsalis pedis pulses are 2+ on the right side and 2+ on the left side.     Heart sounds: No murmur heard.   Pulmonary:     Effort: Pulmonary effort is normal. No respiratory distress.     Breath sounds: Normal breath sounds.  Abdominal:     Palpations: Abdomen is soft. There is no hepatomegaly or mass.     Tenderness: There is no abdominal tenderness.  Lymphadenopathy:     Cervical: No cervical adenopathy.  Skin:    General: Skin is warm.     Findings: No erythema or rash.  Neurological:     Mental Status: She is alert and oriented to person, place, and time.     Cranial Nerves: No cranial nerve deficit.     Gait: Gait normal.  Psychiatric:     Comments: Well groomed, good eye contact.   ASSESSMENT AND PLAN:  Audrey Tucker was seen today for follow-up.  Orders Placed This Encounter  Procedures  . Comprehensive  metabolic panel  . CBC  . Lipid panel  . TSH   Lab Results  Component Value Date   WBC 5.7 05/16/2020   HGB 12.5 05/16/2020   HCT 37.3 05/16/2020   MCV 95.6 05/16/2020   PLT 212.0 05/16/2020   Lab Results  Component Value Date   ALT 13 05/16/2020   AST 21 05/16/2020   ALKPHOS 63 05/16/2020   BILITOT 0.4 05/16/2020   Lab Results  Component Value Date   CREATININE 0.63 05/16/2020   BUN 18 05/16/2020   NA 139 05/16/2020   K 4.5 05/16/2020   CL 104 05/16/2020   CO2 28 05/16/2020   Lab Results  Component Value Date   TSH 2.10 05/16/2020  Lab Results  Component Value Date   CHOL 204 (H) 05/16/2020   HDL 68.00 05/16/2020   LDLCALC 122 (H) 05/16/2020   TRIG 67.0 05/16/2020   CHOLHDL 3 05/16/2020   The 10-year ASCVD risk score Mikey Bussing DC Jr., et al., 2013) is: 10.9%   Values used to calculate the score:     Age: 4 years     Sex: Female     Is Non-Hispanic African American: No     Diabetic: No     Tobacco smoker: No     Systolic Blood Pressure: 650 mmHg     Is BP treated: No     HDL Cholesterol: 68 mg/dL     Total Cholesterol: 204 mg/dL   RLS (restless legs syndrome) Problem is stable. She has been on Lorazepam for years, takes med seldom. Continue Lorazepam 0.5 q/2-1 tab at bedtime as needed. If Rx last a year, she can continue following annually.  -     LORazepam (ATIVAN) 0.5 MG tablet; TAKE 1 TABLET BY MOUTH AS NEEDED FOR ANXIETY/RLS  Fatigue, unspecified type We discussed possible etiologies: Systemic illness, immunologic,endocrinology,sleep disorder, psychiatric/psychologic, infectious,medications side effects, and idiopathic. Examination today does not suggest a serious process. Stressful events and allergies can be contributing factors as well as insomnia.  Further recommendations will be given according to lab results.  Abdominal aortic atherosclerosis ([place]) Incidental findings on pelvic CT. Aspirin side effects discussed,prefers not to start  it. FLP ordered today, recommendations in regard to statins will be given accordingly.  Insomnia, unspecified type Chronic. Good sleep hygiene. OTC Melatonin 5-10 mg 2 hours before bedtime on empty stomach may help. She is not interested in Rx sleeping aids.  Seasonal allergies Symptoms have improved. If symptoms reoccur, OTC antihistaminic can be started.  Return if symptoms worsen or fail to improve, for AWV needed..   Wynter Isaacs G. [place], MD  [facility]. [place] office.   A few things to remember from today's visit:   RLS (restless legs syndrome) - Plan: LORazepam (ATIVAN) 0.5 MG tablet  Fatigue, unspecified type - Plan: Comprehensive metabolic panel, CBC, TSH  Abdominal aortic atherosclerosis (HCC) - Plan: Lipid panel  Insomnia, unspecified type  If you need refills please call your pharmacy. Do not use My Chart to request refills or for acute issues that need immediate attention.  Fatigue is a common symptom associated with multiple factors: psychologic,medications, systemic illness, sleep disorders,infections, and unknown causes. Some work-up can be done to evaluate for common causes as thyroid disease,anemia,diabetes, or abnormalities in calcium,potassium,or sodium. Regular physical activity as tolerated and a healthy diet is usually might help and usually recommended for chronic fatigue.   Please be sure medication list is accurate. If a new problem present, please set up appointment sooner than planned today.

## 2020-07-23 ENCOUNTER — Other Ambulatory Visit: Payer: Self-pay

## 2020-07-23 ENCOUNTER — Ambulatory Visit (INDEPENDENT_AMBULATORY_CARE_PROVIDER_SITE_OTHER): Payer: PPO

## 2020-07-23 DIAGNOSIS — Z0000 Encounter for general adult medical examination without abnormal findings: Secondary | ICD-10-CM

## 2020-07-23 NOTE — Progress Notes (Signed)
Virtual Visit via Telephone Note  I connected with  Audrey Tucker on 07/23/20 at  8:45 AM EDT by telephone and verified that I am speaking with the correct person using two identifiers.  Location: Patient: Home Provider: Office Persons participating in the virtual visit: patient/Nurse Health Advisor   I discussed the limitations, risks, security and privacy concerns of performing an evaluation and management service by telephone and the availability of in person appointments. The patient expressed understanding and agreed to proceed.  Interactive audio and video telecommunications were attempted between this nurse and patient, however failed, due to patient having technical difficulties OR patient did not have access to video capability.  We continued and completed visit with audio only.  Some vital signs may be absent or patient reported.   Willette Brace, LPN    Subjective:   Audrey Tucker is a 75 y.o. female who presents for Medicare Annual (Subsequent) preventive examination.  Review of Systems     Cardiac Risk Factors include: advanced age (>43men, >81 women)     Objective:    There were no vitals filed for this visit. There is no height or weight on file to calculate BMI.  Advanced Directives 07/23/2020 02/22/2019 12/07/2017 05/16/2015  Does Patient Have a Medical Advance Directive? Yes Yes Yes Yes  Type of Paramedic of [employer];Living will [place];Living will - [place]  Does patient want to make changes to medical advance directive? - No - Patient declined - -  Copy of [place] in Chart? No - copy requested No - copy requested - -    Current Medications (verified) Outpatient Encounter Medications as of 07/23/2020  Medication Sig  . calcium-vitamin D 250-100 MG-UNIT tablet Take 1 tablet by mouth 2 (two) times daily.  Marland Kitchen LORazepam (ATIVAN) 0.5 MG tablet TAKE 1 TABLET BY MOUTH AS  NEEDED FOR ANXIETY/RLS  . PARoxetine (PAXIL) 10 MG tablet Take 10 mg by mouth daily.  . [DISCONTINUED] methocarbamol (ROBAXIN) 500 MG tablet Take 1 tablet (500 mg total) by mouth every 8 (eight) hours as needed for muscle spasms.   Facility-Administered Encounter Medications as of 07/23/2020  Medication  . 0.9 %  sodium chloride infusion    Allergies (verified) No known allergies   History: Past Medical History:  Diagnosis Date  . Adenomatous colon polyp    tubular  . Arthritis   . Chicken pox   . Diverticulosis   . Frequent headaches   . Hernia, femoral    pt stated surgery in august of 2022, on left side   . PONV (postoperative nausea and vomiting)   . Seasonal allergies    Past Surgical History:  Procedure Laterality Date  . EYE SURGERY Bilateral    cataract  . FOOT NEUROMA SURGERY Right 04-29-15   Dr Paulla Dolly  . SHOULDER ARTHROSCOPY Left    Family History  Problem Relation Age of Onset  . Arthritis Mother   . Hypertension Mother   . Arthritis Father   . Cancer Father        lung   Social History   Socioeconomic History  . Marital status: Married    Spouse name: Not on file  . Number of children: 0  . Years of education: 16  . Highest education level: Bachelor's degree (e.g., BA, AB, BS)  Occupational History  . Occupation: works occasionally     Comment: retired   Tobacco Use  . Smoking status: Former  Smoker    Quit date: 04/05/1967    Years since quitting: 53.3  . Smokeless tobacco: Never Used  . Tobacco comment: rare use in college   Vaping Use  . Vaping Use: Never used  Substance and Sexual Activity  . Alcohol use: Yes    Comment: social  . Drug use: No  . Sexual activity: Not on file  Other Topics Concern  . Not on file  Social History Narrative   Married   No children   HH 2   Yard work, Designer, television/film set   Works occasionally    Social Determinants of Radio broadcast assistant Strain: [place]   . Difficulty of Paying Living Expenses: Not  hard at all  Food Insecurity: No Food Insecurity  . Worried About Charity fundraiser in the Last Year: Never true  . Ran Out of Food in the Last Year: Never true  Transportation Needs: No Transportation Needs  . Lack of Transportation (Medical): No  . Lack of Transportation (Non-Medical): No  Physical Activity: Inactive  . Days of Exercise per Week: 0 days  . Minutes of Exercise per Session: 0 min  Stress: No Stress Concern Present  . Feeling of Stress : Not at all  Social Connections: Moderately Integrated  . Frequency of Communication with Friends and Family: Once a week  . Frequency of Social Gatherings with Friends and Family: More than three times a week  . Attends Religious Services: Never  . Active Member of Clubs or Organizations: Yes  . Attends Archivist Meetings: 1 to 4 times per year  . Marital Status: Married    Tobacco Counseling Counseling given: Not Answered Comment: rare use in college    Clinical Intake:  Pre-visit preparation completed: Yes  Pain : No/denies pain     BMI - recorded: 22.04 Nutritional Status: BMI of 19-24  Normal Nutritional Risks: None Diabetes: No  How often do you need to have someone help you when you read instructions, pamphlets, or other written materials from your doctor or pharmacy?: 1 - Never  Diabetic?no  Interpreter Needed?: No  Information entered by :: Charlott Rakes, LPN   Activities of Daily Living In your present state of health, do you have any difficulty performing the following activities: 07/23/2020 05/16/2020  Hearing? Y N  Comment tinnitus -  Vision? N N  Difficulty concentrating or making decisions? N N  Walking or climbing stairs? N N  Dressing or bathing? N N  Doing errands, shopping? N N  Preparing Food and eating ? N -  Using the Toilet? N -  In the past six months, have you accidently leaked urine? N -  Do you have problems with loss of bowel control? N -  Managing your Medications? N -   Managing your Finances? N -  Housekeeping or managing your Housekeeping? N -  Some recent data might be hidden    Patient Care Team: Martinique, Betty G, MD as PCP - General (Family Medicine)  Indicate any recent Medical Services you may have received from other than [facility] providers in the past year (date may be approximate).     Assessment:   This is a routine wellness examination for [place].  Hearing/Vision screen  Hearing Screening   125Hz  250Hz  500Hz  1000Hz  2000Hz  3000Hz  4000Hz  6000Hz  8000Hz   Right ear:           Left ear:           Comments: Pt has  tinnitus and some voices she can't hear   Vision Screening Comments: Pt follows Dr Macarthur Critchley for annual eye exams   Dietary issues and exercise activities discussed: Current Exercise Habits: The patient does not participate in regular exercise at present  Goals Addressed            This Visit's Progress   . Patient Stated       Exercise more      Depression Screen PHQ 2/9 Scores 07/23/2020 05/16/2020 02/22/2019 12/07/2017  PHQ - 2 Score 0 0 0 0    Fall Risk Fall Risk  07/23/2020 05/16/2020 02/22/2019 01/10/2019 12/07/2017  Falls in the past year? 0 0 0 0 No  Comment - - - Emmi Telephone Survey: data to providers prior to load -  Number falls in past yr: 0 - - - -  Injury with Fall? 0 - - - -  Risk for fall due to : Impaired vision - Medication side effect - -  Follow up Falls prevention discussed - Falls evaluation completed;Education provided;Falls prevention discussed - -    FALL RISK PREVENTION PERTAINING TO THE HOME:  Any stairs in or around the home? Yes  If so, are there any without handrails? No  Home free of loose throw rugs in walkways, pet beds, electrical cords, etc? Yes  Adequate lighting in your home to reduce risk of falls? Yes   ASSISTIVE DEVICES UTILIZED TO PREVENT FALLS:  Life alert? No  Use of a cane, walker or w/c? No  Grab bars in the bathroom? No  Shower chair or bench in shower? No  Elevated toilet  seat or a handicapped toilet? No   TIMED UP AND GO:  Was the test performed? No     Cognitive Function:     6CIT Screen 07/23/2020 02/22/2019  What Year? 0 points 0 points  What month? 0 points 0 points  What time? 0 points 0 points  Count back from 20 0 points 0 points  Months in reverse 0 points 0 points  Repeat phrase 0 points 0 points  Total Score 0 0    Immunizations Immunization History  Administered Date(s) Administered  . Influenza Whole 12/14/2006  . Influenza-Unspecified 11/29/2013  . Pneumococcal Polysaccharide-23 02/27/2019  . Tdap 02/15/2014    TDAP status: Up to date  Flu Vaccine status: Declined, Education has been provided regarding the importance of this vaccine but patient still declined. Advised may receive this vaccine at local pharmacy or Health Dept. Aware to provide a copy of the vaccination record if obtained from local pharmacy or Health Dept. Verbalized acceptance and understanding.  Pneumococcal vaccine status: Due, Education has been provided regarding the importance of this vaccine. Advised may receive this vaccine at local pharmacy or Health Dept. Aware to provide a copy of the vaccination record if obtained from local pharmacy or Health Dept. Verbalized acceptance and understanding.  Covid-19 vaccine status: Completed vaccines  Qualifies for Shingles Vaccine? Yes   Zostavax completed No   Shingrix Completed?: No.    Education has been provided regarding the importance of this vaccine. Patient has been advised to call insurance company to determine out of pocket expense if they have not yet received this vaccine. Advised may also receive vaccine at local pharmacy or Health Dept. Verbalized acceptance and understanding.  Screening Tests Health Maintenance  Topic Date Due  . Pneumococcal Vaccine 27-23 Years old (1 of 4 - PCV13) Never done  . COVID-19 Vaccine (1) Never done  . Zoster  Vaccines- Shingrix (1 of 2) Never done  . MAMMOGRAM  09/16/2019   . PNA vac Low Risk Adult (2 of 2 - PCV13) 02/27/2020  . INFLUENZA VACCINE  12/15/2028 (Originally 09/15/2020)  . COLONOSCOPY (Pts 45-71yrs Insurance coverage will need to be confirmed)  03/01/2022  . TETANUS/TDAP  02/16/2024  . DEXA SCAN  Completed  . Hepatitis C Screening  Completed  . HPV VACCINES  Aged Out    Health Maintenance  Health Maintenance Due  Topic Date Due  . Pneumococcal Vaccine 32-9 Years old (1 of 4 - PCV13) Never done  . COVID-19 Vaccine (1) Never done  . Zoster Vaccines- Shingrix (1 of 2) Never done  . MAMMOGRAM  09/16/2019  . PNA vac Low Risk Adult (2 of 2 - PCV13) 02/27/2020    Colorectal cancer screening: Type of screening: Colonoscopy. Completed 03/01/17. Repeat every 5 years  Mammogram status: Completed 09/15/17. Repeat every year  Bone Density status: Completed 09/15/16. Results reflect: Bone density results: NORMAL. Repeat every 3-5 years.   Additional Screening:  Hepatitis C Screening: Completed 01/11/18  Vision Screening: Recommended annual ophthalmology exams for early detection of glaucoma and other disorders of the eye. Is the patient up to date with their annual eye exam?  Yes  Who is the provider or what is the name of the office in which the patient attends annual eye exams? Dr Macarthur Critchley  If pt is not established with a provider, would they like to be referred to a provider to establish care? No .   Dental Screening: Recommended annual dental exams for proper oral hygiene  Community Resource Referral / Chronic Care Management: CRR required this visit?  No   CCM required this visit?  No      Plan:     I have personally reviewed and noted the following in the patient's chart:   . Medical and social history . Use of alcohol, tobacco or illicit drugs  . Current medications and supplements including opioid prescriptions.  . Functional ability and status . Nutritional status . Physical activity . Advanced directives . List of other  physicians . Hospitalizations, surgeries, and ER visits in previous 12 months . Vitals . Screenings to include cognitive, depression, and falls . Referrals and appointments  In addition, I have reviewed and discussed with patient certain preventive protocols, quality metrics, and best practice recommendations. A written personalized care plan for preventive services as well as general preventive health recommendations were provided to patient.     Willette Brace, LPN   03/23/2033   Nurse Notes: None

## 2020-07-23 NOTE — Patient Instructions (Signed)
Audrey Tucker , Thank you for taking time to come for your Medicare Wellness Visit. I appreciate your ongoing commitment to your health goals. Please review the following plan we discussed and let me know if I can assist you in the future.   Screening recommendations/referrals: Colonoscopy: Done 03/01/17 Mammogram: Done 09/15/17 Bone Density: Done 09/15/16 Recommended yearly ophthalmology/optometry visit for glaucoma screening and checkup Recommended yearly dental visit for hygiene and checkup  Vaccinations: Influenza vaccine: Due Pneumococcal vaccine: Due and discussed Tdap vaccine: Up to date Shingles vaccine: Shingrix discussed. Please contact your pharmacy for coverage information.    Covid-19:Pt will call back with dates   Advanced directives: Please bring a copy of your health care power of attorney and living will to the office at your convenience.  Conditions/risks identified: more exercise   Next appointment: Follow up in one year for your annual wellness visit    Preventive Care 65 Years and Older, Female Preventive care refers to lifestyle choices and visits with your health care provider that can promote health and wellness. What does preventive care include?  A yearly physical exam. This is also called an annual well check.  Dental exams once or twice a year.  Routine eye exams. Ask your health care provider how often you should have your eyes checked.  Personal lifestyle choices, including:  Daily care of your teeth and gums.  Regular physical activity.  Eating a healthy diet.  Avoiding tobacco and drug use.  Limiting alcohol use.  Practicing safe sex.  Taking low-dose aspirin every day.  Taking vitamin and mineral supplements as recommended by your health care provider. What happens during an annual well check? The services and screenings done by your health care provider during your annual well check will depend on your age, overall health, lifestyle risk  factors, and family history of disease. Counseling  Your health care provider may ask you questions about your:  Alcohol use.  Tobacco use.  Drug use.  Emotional well-being.  Home and relationship well-being.  Sexual activity.  Eating habits.  History of falls.  Memory and ability to understand (cognition).  Work and work Statistician.  Reproductive health. Screening  You may have the following tests or measurements:  Height, weight, and BMI.  Blood pressure.  Lipid and cholesterol levels. These may be checked every 5 years, or more frequently if you are over 88 years old.  Skin check.  Lung cancer screening. You may have this screening every year starting at age 29 if you have a 30-pack-year history of smoking and currently smoke or have quit within the past 15 years.  Fecal occult blood test (FOBT) of the stool. You may have this test every year starting at age 63.  Flexible sigmoidoscopy or colonoscopy. You may have a sigmoidoscopy every 5 years or a colonoscopy every 10 years starting at age 62.  Hepatitis C blood test.  Hepatitis B blood test.  Sexually transmitted disease (STD) testing.  Diabetes screening. This is done by checking your blood sugar (glucose) after you have not eaten for a while (fasting). You may have this done every 1-3 years.  Bone density scan. This is done to screen for osteoporosis. You may have this done starting at age 65.  Mammogram. This may be done every 1-2 years. Talk to your health care provider about how often you should have regular mammograms. Talk with your health care provider about your test results, treatment options, and if necessary, the need for more tests. Vaccines  Your health care provider may recommend certain vaccines, such as:  Influenza vaccine. This is recommended every year.  Tetanus, diphtheria, and acellular pertussis (Tdap, Td) vaccine. You may need a Td booster every 10 years.  Zoster vaccine. You  may need this after age 35.  Pneumococcal 13-valent conjugate (PCV13) vaccine. One dose is recommended after age 58.  Pneumococcal polysaccharide (PPSV23) vaccine. One dose is recommended after age 67. Talk to your health care provider about which screenings and vaccines you need and how often you need them. This information is not intended to replace advice given to you by your health care provider. Make sure you discuss any questions you have with your health care provider. Document Released: 02/28/2015 Document Revised: 10/22/2015 Document Reviewed: 12/03/2014 Elsevier Interactive Patient Education  2017 [place] Prevention in the Home Falls can cause injuries. They can happen to people of all ages. There are many things you can do to make your home safe and to help prevent falls. What can I do on the outside of my home?  Regularly fix the edges of walkways and driveways and fix any cracks.  Remove anything that might make you trip as you walk through a door, such as a raised step or threshold.  Trim any bushes or trees on the path to your home.  Use bright outdoor lighting.  Clear any walking paths of anything that might make someone trip, such as rocks or tools.  Regularly check to see if handrails are loose or broken. Make sure that both sides of any steps have handrails.  Any raised decks and porches should have guardrails on the edges.  Have any leaves, snow, or ice cleared regularly.  Use sand or salt on walking paths during winter.  Clean up any spills in your garage right away. This includes oil or grease spills. What can I do in the bathroom?  Use night lights.  Install grab bars by the toilet and in the tub and shower. Do not use towel bars as grab bars.  Use non-skid mats or decals in the tub or shower.  If you need to sit down in the shower, use a plastic, non-slip stool.  Keep the floor dry. Clean up any water that spills on the floor as soon as  it happens.  Remove soap buildup in the tub or shower regularly.  Attach bath mats securely with double-sided non-slip rug tape.  Do not have throw rugs and other things on the floor that can make you trip. What can I do in the bedroom?  Use night lights.  Make sure that you have a light by your bed that is easy to reach.  Do not use any sheets or blankets that are too big for your bed. They should not hang down onto the floor.  Have a firm chair that has side arms. You can use this for support while you get dressed.  Do not have throw rugs and other things on the floor that can make you trip. What can I do in the kitchen?  Clean up any spills right away.  Avoid walking on wet floors.  Keep items that you use a lot in easy-to-reach places.  If you need to reach something above you, use a strong step stool that has a grab bar.  Keep electrical cords out of the way.  Do not use floor polish or wax that makes floors slippery. If you must use wax, use non-skid floor wax.  Do  not have throw rugs and other things on the floor that can make you trip. What can I do with my stairs?  Do not leave any items on the stairs.  Make sure that there are handrails on both sides of the stairs and use them. Fix handrails that are broken or loose. Make sure that handrails are as long as the stairways.  Check any carpeting to make sure that it is firmly attached to the stairs. Fix any carpet that is loose or worn.  Avoid having throw rugs at the top or bottom of the stairs. If you do have throw rugs, attach them to the floor with carpet tape.  Make sure that you have a light switch at the top of the stairs and the bottom of the stairs. If you do not have them, ask someone to add them for you. What else can I do to help prevent falls?  Wear shoes that:  Do not have high heels.  Have rubber bottoms.  Are comfortable and fit you well.  Are closed at the toe. Do not wear sandals.  If  you use a stepladder:  Make sure that it is fully opened. Do not climb a closed stepladder.  Make sure that both sides of the stepladder are locked into place.  Ask someone to hold it for you, if possible.  Clearly mark and make sure that you can see:  Any grab bars or handrails.  First and last steps.  Where the edge of each step is.  Use tools that help you move around (mobility aids) if they are needed. These include:  Canes.  Walkers.  Scooters.  Crutches.  Turn on the lights when you go into a dark area. Replace any light bulbs as soon as they burn out.  Set up your furniture so you have a clear path. Avoid moving your furniture around.  If any of your floors are uneven, fix them.  If there are any pets around you, be aware of where they are.  Review your medicines with your doctor. Some medicines can make you feel dizzy. This can increase your chance of falling. Ask your doctor what other things that you can do to help prevent falls. This information is not intended to replace advice given to you by your health care provider. Make sure you discuss any questions you have with your health care provider. Document Released: 11/28/2008 Document Revised: 07/10/2015 Document Reviewed: 03/08/2014 Elsevier Interactive Patient Education  2017 [employer].

## 2020-08-27 ENCOUNTER — Ambulatory Visit: Payer: Self-pay | Admitting: General Surgery

## 2020-09-15 NOTE — Progress Notes (Signed)
DUE TO COVID-19 ONLY ONE VISITOR IS ALLOWED TO COME WITH YOU AND STAY IN THE WAITING ROOM ONLY DURING PRE OP AND PROCEDURE DAY OF SURGERY.  2 VISITOR  MAY VISIT WITH YOU AFTER SURGERY IN YOUR PRIVATE ROOM DURING VISITING HOURS ONLY!  YOU NEED TO HAVE A COVID 19 TEST ON______AME'@_'$  '@_from'$  8am-3pm _____, THIS TEST MUST BE DONE BEFORE SURGERY,  Covid test is done at [place], [place] [address].  This is a drive thru.  No appt required. Please see map.                 Your procedure is scheduled on:        09/25/2020   Report to [facility] Main  Entrance   Report to admitting at     0930AM     Call this number if you have problems the morning of surgery [phone]    REMEMBER: NO  SOLID FOOD CANDY OR GUM AFTER MIDNIGHT. CLEAR LIQUIDS UNTIL    0830am        . NOTHING BY MOUTH EXCEPT CLEAR LIQUIDS UNTIL   0830am   . PLEASE FINISH ENSURE DRINK PER SURGEON ORDER  WHICH NEEDS TO BE COMPLETED AT     0830am  .      CLEAR LIQUID DIET   Foods Allowed                                                                    Coffee and tea, regular and decaf                            Fruit ices (not with fruit pulp)                                      Iced Popsicles                                    Carbonated beverages, regular and diet                                    Cranberry, grape and apple juices Sports drinks like Gatorade Lightly seasoned clear broth or consume(fat free) Sugar, honey syrup ___________________________________________________________________      BRUSH YOUR TEETH MORNING OF SURGERY AND RINSE YOUR MOUTH OUT, NO CHEWING GUM CANDY OR MINTS.     Take these medicines the morning of surgery with A SIP OF WATER:     None  DO NOT TAKE ANY DIABETIC MEDICATIONS DAY OF YOUR SURGERY                               You may not have any metal on your body including hair pins and              piercings  Do not wear jewelry, make-up, lotions, powders or  perfumes, deodorant  Do not wear nail polish on your fingernails.  Do not shave  48 hours prior to surgery.              Men may shave face and neck.   Do not bring valuables to the hospital. [place].  Contacts, dentures or bridgework may not be worn into surgery.  Leave suitcase in the car. After surgery it may be brought to your room.     Patients discharged the day of surgery will not be allowed to drive home. IF YOU ARE HAVING SURGERY AND GOING HOME THE SAME DAY, YOU MUST HAVE AN ADULT TO DRIVE YOU HOME AND BE WITH YOU FOR 24 HOURS. YOU MAY GO HOME BY TAXI OR UBER OR ORTHERWISE, BUT AN ADULT MUST ACCOMPANY YOU HOME AND STAY WITH YOU FOR 24 HOURS.  Name and phone number of your driver:  Special Instructions: N/A              Please read over the following fact sheets you were given: _____________________________________________________________________  [facility] - Preparing for Surgery Before surgery, you can play an important role.  Because skin is not sterile, your skin needs to be as free of germs as possible.  You can reduce the number of germs on your skin by washing with CHG (chlorahexidine gluconate) soap before surgery.  CHG is an antiseptic cleaner which kills germs and bonds with the skin to continue killing germs even after washing. Please DO NOT use if you have an allergy to CHG or antibacterial soaps.  If your skin becomes reddened/irritated stop using the CHG and inform your nurse when you arrive at Short Stay. Do not shave (including legs and underarms) for at least 48 hours prior to the first CHG shower.  You may shave your face/neck. Please follow these instructions carefully:  1.  Shower with CHG Soap the night before surgery and the  morning of Surgery.  2.  If you choose to wash your hair, wash your hair first as usual with your  normal  shampoo.  3.  After you shampoo, rinse your hair and body thoroughly  to remove the  shampoo.                           4.  Use CHG as you would any other liquid soap.  You can apply chg directly  to the skin and wash                       Gently with a scrungie or clean washcloth.  5.  Apply the CHG Soap to your body ONLY FROM THE NECK DOWN.   Do not use on face/ open                           Wound or open sores. Avoid contact with eyes, ears mouth and genitals (private parts).                       Wash face,  Genitals (private parts) with your normal soap.             6.  Wash thoroughly, paying special attention to the area where your surgery  will be performed.  7.  Thoroughly rinse your body with  warm water from the neck down.  8.  DO NOT shower/wash with your normal soap after using and rinsing off  the CHG Soap.                9.  Pat yourself dry with a clean towel.            10.  Wear clean pajamas.            11.  Place clean sheets on your bed the night of your first shower and do not  sleep with pets. Day of Surgery : Do not apply any lotions/deodorants the morning of surgery.  Please wear clean clothes to the hospital/surgery center.  FAILURE TO FOLLOW THESE INSTRUCTIONS MAY RESULT IN THE CANCELLATION OF YOUR SURGERY PATIENT SIGNATURE_________________________________  NURSE SIGNATURE__________________________________  ________________________________________________________________________

## 2020-09-17 ENCOUNTER — Encounter (HOSPITAL_COMMUNITY): Payer: Self-pay

## 2020-09-17 ENCOUNTER — Encounter (HOSPITAL_COMMUNITY)
Admission: RE | Admit: 2020-09-17 | Discharge: 2020-09-17 | Disposition: A | Discharge status: Home / Self Care | Payer: PPO | Source: Ambulatory Visit | Attending: General Surgery | Admitting: General Surgery

## 2020-09-17 ENCOUNTER — Other Ambulatory Visit: Payer: Self-pay

## 2020-09-17 DIAGNOSIS — Z01812 Encounter for preprocedural laboratory examination: Secondary | ICD-10-CM | POA: Insufficient documentation

## 2020-09-17 LAB — CBC
HCT: 37 % (ref 36.0–46.0)
Hemoglobin: 12.1 g/dL (ref 12.0–15.0)
MCH: 31.5 pg (ref 26.0–34.0)
MCHC: 32.7 g/dL (ref 30.0–36.0)
MCV: 96.4 fL (ref 80.0–100.0)
Platelets: 219 10*3/uL (ref 150–400)
RBC: 3.84 MIL/uL — ABNORMAL LOW (ref 3.87–5.11)
RDW: 13.3 % (ref 11.5–15.5)
WBC: 5.1 10*3/uL (ref 4.0–10.5)
nRBC: 0 % (ref 0.0–0.2)

## 2020-09-17 NOTE — Progress Notes (Signed)
Anesthesia Review:  PCP: DR Betty Martinique  Cardiologist : none  Chest x-ray : EKG : Echo : Stress test: Cardiac Cath :  Activity level: can doa flight of stairs without difficulty  Sleep Study/ CPAP : none  Fasting Blood Sugar :      / Checks Blood Sugar -- times a day:   Blood Thinner/ Instructions /Last Dose: ASA / Instructions/ Last Dose :

## 2020-09-24 MED ORDER — BUPIVACAINE LIPOSOME 1.3 % IJ SUSP
20.0000 mL | Freq: Once | INTRAMUSCULAR | Status: DC
  Filled 2020-09-24: qty 20

## 2020-09-25 ENCOUNTER — Encounter (HOSPITAL_COMMUNITY)
Admission: RE | Disposition: A | Discharge status: Home / Self Care | Payer: Self-pay | Source: Ambulatory Visit | Attending: General Surgery

## 2020-09-25 ENCOUNTER — Ambulatory Visit (HOSPITAL_COMMUNITY): Payer: PPO | Admitting: Anesthesiology

## 2020-09-25 ENCOUNTER — Encounter (HOSPITAL_COMMUNITY): Payer: Self-pay | Admitting: General Surgery

## 2020-09-25 ENCOUNTER — Ambulatory Visit (HOSPITAL_COMMUNITY)
Admission: RE | Admit: 2020-09-25 | Discharge: 2020-09-25 | Disposition: A | Discharge status: Home / Self Care | Payer: PPO | Source: Ambulatory Visit | Attending: General Surgery | Admitting: General Surgery

## 2020-09-25 DIAGNOSIS — Z79899 Other long term (current) drug therapy: Secondary | ICD-10-CM | POA: Diagnosis not present

## 2020-09-25 DIAGNOSIS — Z87891 Personal history of nicotine dependence: Secondary | ICD-10-CM | POA: Diagnosis not present

## 2020-09-25 DIAGNOSIS — K4090 Unilateral inguinal hernia, without obstruction or gangrene, not specified as recurrent: Secondary | ICD-10-CM | POA: Diagnosis not present

## 2020-09-25 DIAGNOSIS — M7072 Other bursitis of hip, left hip: Secondary | ICD-10-CM | POA: Diagnosis not present

## 2020-09-25 DIAGNOSIS — M1990 Unspecified osteoarthritis, unspecified site: Secondary | ICD-10-CM | POA: Diagnosis not present

## 2020-09-25 DIAGNOSIS — M7071 Other bursitis of hip, right hip: Secondary | ICD-10-CM | POA: Diagnosis not present

## 2020-09-25 DIAGNOSIS — K4020 Bilateral inguinal hernia, without obstruction or gangrene, not specified as recurrent: Secondary | ICD-10-CM | POA: Insufficient documentation

## 2020-09-25 DIAGNOSIS — G2581 Restless legs syndrome: Secondary | ICD-10-CM

## 2020-09-25 HISTORY — PX: INGUINAL HERNIA REPAIR: SHX194

## 2020-09-25 SURGERY — REPAIR, HERNIA, INGUINAL, LAPAROSCOPIC
Anesthesia: General | Laterality: Bilateral

## 2020-09-25 MED ORDER — ROCURONIUM BROMIDE 10 MG/ML (PF) SYRINGE
PREFILLED_SYRINGE | INTRAVENOUS | Status: AC
  Filled 2020-09-25: qty 10

## 2020-09-25 MED ORDER — BUPIVACAINE LIPOSOME 1.3 % IJ SUSP
INTRAMUSCULAR | Status: DC | PRN
  Administered 2020-09-25: 20 mL

## 2020-09-25 MED ORDER — ONDANSETRON HCL 4 MG/2ML IJ SOLN
4.0000 mg | Freq: Once | INTRAMUSCULAR | Status: AC | PRN
  Administered 2020-09-25: 4 mg via INTRAVENOUS

## 2020-09-25 MED ORDER — BUPIVACAINE-EPINEPHRINE (PF) 0.25% -1:200000 IJ SOLN
INTRAMUSCULAR | Status: AC
  Filled 2020-09-25: qty 30

## 2020-09-25 MED ORDER — CHLORHEXIDINE GLUCONATE CLOTH 2 % EX PADS: 6.0000 | MEDICATED_PAD | Freq: Once | CUTANEOUS | Status: DC

## 2020-09-25 MED ORDER — FENTANYL CITRATE (PF) 100 MCG/2ML IJ SOLN
INTRAMUSCULAR | Status: DC | PRN
  Administered 2020-09-25 (×3): 50 ug via INTRAVENOUS

## 2020-09-25 MED ORDER — FENTANYL CITRATE (PF) 100 MCG/2ML IJ SOLN: 25.0000 ug | INTRAMUSCULAR | Status: DC | PRN

## 2020-09-25 MED ORDER — ENSURE PRE-SURGERY PO LIQD
296.0000 mL | Freq: Once | ORAL | Status: DC
  Filled 2020-09-25: qty 296

## 2020-09-25 MED ORDER — DEXAMETHASONE SODIUM PHOSPHATE 10 MG/ML IJ SOLN
INTRAMUSCULAR | Status: AC
  Filled 2020-09-25: qty 1

## 2020-09-25 MED ORDER — LACTATED RINGERS IV SOLN: INTRAVENOUS | Status: DC

## 2020-09-25 MED ORDER — PHENYLEPHRINE 40 MCG/ML (10ML) SYRINGE FOR IV PUSH (FOR BLOOD PRESSURE SUPPORT)
PREFILLED_SYRINGE | INTRAVENOUS | Status: DC | PRN
  Administered 2020-09-25: 120 ug via INTRAVENOUS
  Administered 2020-09-25: 80 ug via INTRAVENOUS

## 2020-09-25 MED ORDER — OXYCODONE HCL 5 MG PO TABS: 5.0000 mg | ORAL_TABLET | Freq: Once | ORAL | Status: DC | PRN

## 2020-09-25 MED ORDER — MEPERIDINE HCL 50 MG/ML IJ SOLN: 6.2500 mg | INTRAMUSCULAR | Status: DC | PRN

## 2020-09-25 MED ORDER — 0.9 % SODIUM CHLORIDE (POUR BTL) OPTIME
TOPICAL | Status: DC | PRN
  Administered 2020-09-25: 1000 mL

## 2020-09-25 MED ORDER — CEFAZOLIN SODIUM-DEXTROSE 2-4 GM/100ML-% IV SOLN
2.0000 g | INTRAVENOUS | Status: AC
  Administered 2020-09-25: 2 g via INTRAVENOUS
  Filled 2020-09-25: qty 100

## 2020-09-25 MED ORDER — ONDANSETRON HCL 4 MG/2ML IJ SOLN
INTRAMUSCULAR | Status: AC
  Filled 2020-09-25: qty 2

## 2020-09-25 MED ORDER — ROCURONIUM BROMIDE 10 MG/ML (PF) SYRINGE
PREFILLED_SYRINGE | INTRAVENOUS | Status: DC | PRN
  Administered 2020-09-25: 50 mg via INTRAVENOUS
  Administered 2020-09-25: 10 mg via INTRAVENOUS
  Administered 2020-09-25: 20 mg via INTRAVENOUS

## 2020-09-25 MED ORDER — BUPIVACAINE-EPINEPHRINE (PF) 0.25% -1:200000 IJ SOLN
INTRAMUSCULAR | Status: DC | PRN
  Administered 2020-09-25: 30 mL

## 2020-09-25 MED ORDER — ACETAMINOPHEN 325 MG PO TABS: 325.0000 mg | ORAL_TABLET | ORAL | Status: DC | PRN

## 2020-09-25 MED ORDER — PHENYLEPHRINE 40 MCG/ML (10ML) SYRINGE FOR IV PUSH (FOR BLOOD PRESSURE SUPPORT)
PREFILLED_SYRINGE | INTRAVENOUS | Status: AC
  Filled 2020-09-25: qty 10

## 2020-09-25 MED ORDER — FENTANYL CITRATE (PF) 250 MCG/5ML IJ SOLN
INTRAMUSCULAR | Status: AC
  Filled 2020-09-25: qty 5

## 2020-09-25 MED ORDER — PHENYLEPHRINE HCL-NACL 20-0.9 MG/250ML-% IV SOLN
INTRAVENOUS | Status: DC | PRN
  Administered 2020-09-25: 35 ug/min via INTRAVENOUS

## 2020-09-25 MED ORDER — MIDAZOLAM HCL 2 MG/2ML IJ SOLN
INTRAMUSCULAR | Status: AC
  Filled 2020-09-25: qty 2

## 2020-09-25 MED ORDER — ORAL CARE MOUTH RINSE: 15.0000 mL | Freq: Once | OROMUCOSAL | Status: AC

## 2020-09-25 MED ORDER — OXYCODONE HCL 5 MG PO TABS: 5.0000 mg | ORAL_TABLET | Freq: Four times a day (QID) | ORAL | 0 refills | Status: DC | PRN

## 2020-09-25 MED ORDER — OXYCODONE HCL 5 MG/5ML PO SOLN: 5.0000 mg | Freq: Once | ORAL | Status: DC | PRN

## 2020-09-25 MED ORDER — IBUPROFEN 800 MG PO TABS: 800.0000 mg | ORAL_TABLET | Freq: Three times a day (TID) | ORAL | 0 refills | Status: DC | PRN

## 2020-09-25 MED ORDER — PROPOFOL 10 MG/ML IV BOLUS
INTRAVENOUS | Status: AC
  Filled 2020-09-25: qty 20

## 2020-09-25 MED ORDER — DEXAMETHASONE SODIUM PHOSPHATE 10 MG/ML IJ SOLN
INTRAMUSCULAR | Status: DC | PRN
  Administered 2020-09-25: 10 mg via INTRAVENOUS

## 2020-09-25 MED ORDER — CELECOXIB 200 MG PO CAPS
400.0000 mg | ORAL_CAPSULE | ORAL | Status: AC
  Administered 2020-09-25: 400 mg via ORAL
  Filled 2020-09-25: qty 2

## 2020-09-25 MED ORDER — ACETAMINOPHEN 160 MG/5ML PO SOLN: 325.0000 mg | ORAL | Status: DC | PRN

## 2020-09-25 MED ORDER — EPHEDRINE SULFATE-NACL 50-0.9 MG/10ML-% IV SOSY
PREFILLED_SYRINGE | INTRAVENOUS | Status: DC | PRN
  Administered 2020-09-25: 5 mg via INTRAVENOUS

## 2020-09-25 MED ORDER — MIDAZOLAM HCL 5 MG/5ML IJ SOLN
INTRAMUSCULAR | Status: DC | PRN
  Administered 2020-09-25: 1 mg via INTRAVENOUS

## 2020-09-25 MED ORDER — CHLORHEXIDINE GLUCONATE 0.12 % MT SOLN
15.0000 mL | Freq: Once | OROMUCOSAL | Status: AC
  Administered 2020-09-25: 15 mL via OROMUCOSAL

## 2020-09-25 MED ORDER — LIDOCAINE 2% (20 MG/ML) 5 ML SYRINGE
INTRAMUSCULAR | Status: AC
  Filled 2020-09-25: qty 5

## 2020-09-25 MED ORDER — ACETAMINOPHEN 500 MG PO TABS
1000.0000 mg | ORAL_TABLET | ORAL | Status: AC
  Administered 2020-09-25: 1000 mg via ORAL
  Filled 2020-09-25: qty 2

## 2020-09-25 MED ORDER — ONDANSETRON HCL 4 MG/2ML IJ SOLN
INTRAMUSCULAR | Status: DC | PRN
  Administered 2020-09-25: 4 mg via INTRAVENOUS

## 2020-09-25 MED ORDER — PROPOFOL 10 MG/ML IV BOLUS
INTRAVENOUS | Status: DC | PRN
  Administered 2020-09-25: 100 mg via INTRAVENOUS

## 2020-09-25 MED ORDER — SUGAMMADEX SODIUM 200 MG/2ML IV SOLN
INTRAVENOUS | Status: DC | PRN
  Administered 2020-09-25: 200 mg via INTRAVENOUS

## 2020-09-25 MED ORDER — LIDOCAINE 2% (20 MG/ML) 5 ML SYRINGE
INTRAMUSCULAR | Status: DC | PRN
Start: 2020-09-25 — End: 2020-09-25
  Administered 2020-09-25: 100 mg via INTRAVENOUS

## 2020-09-25 SURGICAL SUPPLY — 34 items
APPLIER CLIP 5 13 M/L LIGAMAX5 (MISCELLANEOUS)
BAG COUNTER SPONGE SURGICOUNT (BAG) IMPLANT
BENZOIN TINCTURE PRP APPL 2/3 (GAUZE/BANDAGES/DRESSINGS) ×2 IMPLANT
BNDG ADH 1X3 SHEER STRL LF (GAUZE/BANDAGES/DRESSINGS) ×6 IMPLANT
CABLE HIGH FREQUENCY MONO STRZ (ELECTRODE) ×2 IMPLANT
CHLORAPREP W/TINT 26 (MISCELLANEOUS) ×2 IMPLANT
CLIP APPLIE 5 13 M/L LIGAMAX5 (MISCELLANEOUS) IMPLANT
COVER SURGICAL LIGHT HANDLE (MISCELLANEOUS) ×2 IMPLANT
DECANTER SPIKE VIAL GLASS SM (MISCELLANEOUS) ×2 IMPLANT
DERMABOND ADVANCED (GAUZE/BANDAGES/DRESSINGS) ×1
DERMABOND ADVANCED .7 DNX12 (GAUZE/BANDAGES/DRESSINGS) ×1 IMPLANT
DEVICE SECURE STRAP 25 ABSORB (INSTRUMENTS) ×2 IMPLANT
ELECT REM PT RETURN 15FT ADLT (MISCELLANEOUS) ×2 IMPLANT
GLOVE SURG POLYISO LF SZ7 (GLOVE) ×2 IMPLANT
GLOVE SURG UNDER POLY LF SZ7 (GLOVE) ×2 IMPLANT
GOWN STRL REUS W/TWL LRG LVL3 (GOWN DISPOSABLE) ×2 IMPLANT
GOWN STRL REUS W/TWL XL LVL3 (GOWN DISPOSABLE) ×4 IMPLANT
GRASPER SUT TROCAR 14GX15 (MISCELLANEOUS) ×2 IMPLANT
KIT BASIN OR (CUSTOM PROCEDURE TRAY) ×2 IMPLANT
KIT TURNOVER KIT A (KITS) ×2 IMPLANT
MESH 3DMAX 4X6 LT LRG (Mesh General) ×2 IMPLANT
MESH 3DMAX 4X6 RT LRG (Mesh General) ×2 IMPLANT
SCISSORS LAP 5X35 DISP (ENDOMECHANICALS) IMPLANT
SET IRRIG TUBING LAPAROSCOPIC (IRRIGATION / IRRIGATOR) IMPLANT
SET TUBE SMOKE EVAC HIGH FLOW (TUBING) ×2 IMPLANT
SLEEVE XCEL OPT CAN 5 100 (ENDOMECHANICALS) ×2 IMPLANT
STRIP CLOSURE SKIN 1/2X4 (GAUZE/BANDAGES/DRESSINGS) ×2 IMPLANT
SUT MNCRL AB 4-0 PS2 18 (SUTURE) ×2 IMPLANT
SUT VICRYL 0 UR6 27IN ABS (SUTURE) ×2 IMPLANT
TOWEL OR 17X26 10 PK STRL BLUE (TOWEL DISPOSABLE) ×2 IMPLANT
TRAY FOLEY MTR SLVR 16FR STAT (SET/KITS/TRAYS/PACK) ×2 IMPLANT
TRAY LAPAROSCOPIC (CUSTOM PROCEDURE TRAY) ×2 IMPLANT
TROCAR BLADELESS OPT 5 100 (ENDOMECHANICALS) ×2 IMPLANT
TROCAR XCEL 12X100 BLDLESS (ENDOMECHANICALS) ×2 IMPLANT

## 2020-09-25 NOTE — H&P (Signed)
Audrey Tucker is an 75 y.o. female.   Chief Complaint: hernia HPI: 75 yo female with left inguinal hernia. She has occasional pain and bulge in the area. She presents for repair.  Past Medical History:  Diagnosis Date   Adenomatous colon polyp    tubular   Arthritis    Chicken pox    Diverticulosis    Hernia, femoral    pt stated surgery in august of 2022, on left side    PONV (postoperative nausea and vomiting)    Seasonal allergies     Past Surgical History:  Procedure Laterality Date   EYE SURGERY Bilateral    cataract   FOOT NEUROMA SURGERY Right 04/29/2015   Dr Paulla Dolly   right hand surgery      SHOULDER ARTHROSCOPY Left     Family History  Problem Relation Age of Onset   Arthritis Mother    Hypertension Mother    Arthritis Father    Cancer Father        lung   Social History:  reports that she quit smoking about 53 years ago. Her smoking use included cigarettes. She has never used smokeless tobacco. She reports current alcohol use. She reports that she does not use drugs.  Allergies: No Known Allergies  Facility-Administered Medications Prior to Admission  Medication Dose Route Frequency Provider Last Rate Last Admin   0.9 %  sodium chloride infusion  500 mL Intravenous Once Irene Shipper, MD       Medications Prior to Admission  Medication Sig Dispense Refill   calcium-vitamin D 250-100 MG-UNIT tablet Take 1 tablet by mouth 4 (four) times a week.     LORazepam (ATIVAN) 0.5 MG tablet TAKE 1 TABLET BY MOUTH AS NEEDED FOR ANXIETY/RLS (Patient taking differently: Take 0.5 mg by mouth daily as needed (restless leg syndrome).) 30 tablet 0   PARoxetine (PAXIL) 10 MG tablet Take 10 mg by mouth every evening.     No results found for this or any previous visit (from the past 48 hour(s)). No results found.  Review of Systems  Constitutional:  Negative for chills and fever.  HENT:  Negative for hearing loss.   Respiratory:  Negative for cough.   Cardiovascular:   Negative for chest pain and palpitations.  Gastrointestinal:  Negative for abdominal pain, nausea and vomiting.  Genitourinary:  Negative for dysuria and urgency.  Musculoskeletal:  Negative for myalgias and neck pain.  Skin:  Negative for rash.  Neurological:  Negative for dizziness and headaches.  Hematological:  Does not bruise/bleed easily.  Psychiatric/Behavioral:  Negative for suicidal ideas.    Blood pressure 110/60, pulse 65, temperature 98 F (36.7 C), temperature source Oral, resp. rate 18, height '5\' 4"'$  (1.626 m), weight 57.6 kg, SpO2 99 %. Physical Exam Vitals reviewed.  Constitutional:      Appearance: She is well-developed.  HENT:     Head: Normocephalic and atraumatic.  Eyes:     Conjunctiva/sclera: Conjunctivae normal.     Pupils: Pupils are equal, round, and reactive to light.  Cardiovascular:     Rate and Rhythm: Normal rate and regular rhythm.  Pulmonary:     Effort: Pulmonary effort is normal.     Breath sounds: Normal breath sounds.  Abdominal:     General: Bowel sounds are normal. There is no distension.     Palpations: Abdomen is soft.     Tenderness: There is no abdominal tenderness.     Comments: Small left inguinal hernia  Musculoskeletal:        General: Normal range of motion.     Cervical back: Normal range of motion and neck supple.  Skin:    General: Skin is warm and dry.  Neurological:     Mental Status: She is alert and oriented to person, place, and time.  Psychiatric:        Behavior: Behavior normal.    Assessment/Plan 75 yo female with left inguinal hernia -lap left inguinal hernia repair with mesh -ERAS protocol -outpatient procedure  Mickeal Skinner, MD 09/25/2020, 9:36 AM

## 2020-09-25 NOTE — Anesthesia Procedure Notes (Signed)
Procedure Name: Intubation Date/Time: 09/25/2020 9:50 AM Performed by: Maxwell Caul, CRNA Pre-anesthesia Checklist: Patient identified, Emergency Drugs available, Suction available and Patient being monitored Patient Re-evaluated:Patient Re-evaluated prior to induction Oxygen Delivery Method: Circle system utilized Preoxygenation: Pre-oxygenation with 100% oxygen Induction Type: IV induction Ventilation: Mask ventilation without difficulty Laryngoscope Size: Mac and 4 Grade View: Grade I Tube type: Oral Tube size: 7.0 mm Number of attempts: 1 Airway Equipment and Method: Stylet Placement Confirmation: ETT inserted through vocal cords under direct vision, positive ETCO2 and breath sounds checked- equal and bilateral Secured at: 22 cm Tube secured with: Tape Dental Injury: Teeth and Oropharynx as per pre-operative assessment

## 2020-09-25 NOTE — Op Note (Signed)
Preop diagnosis: left inguinal hernia  Postop diagnosis: bilateral indirect inguinal hernia  Procedure: laparoscopic Bilateral inguinal hernia repair with mesh  Surgeon: Gurney Maxin, M.D.  Asst: none  Anesthesia: Gen.   Indications for procedure: Audrey Tucker is a 75 y.o. female with symptoms of pain and enlarging Left inguinal hernia(s). After discussing risks, alternatives and benefits she decided on laparoscopic repair and was brought to day surgery for repair.  Description of procedure: The patient was brought into the operative suite, placed supine. Anesthesia was administered with endotracheal tube. Patient was strapped in place. The patient was prepped and draped in the usual sterile fashion.  Next, a small left subcostal incision was made. A 54m trocar was used to gain access to the peritoneal cavity by optical entry technique. Pneumoperitoneum was applied with a high flow and low pressure. The laparoscope was reinserted to confirm position. 1 12 mm trocar was placed in the infraumbilical area and 1 5 mm trocar was placed in the right mid abdomen  On initial visualization , there was a moderate left inguinal hernia and small right inguinal hernia. A peritoneal flap was created on the left with harmonic scalpel. Next I began our dissection on the left, identifying the ASIS laterally and then working back medially removing the filmy tissue and adhesions of the peritoneum to the abdominal wall. Hernia sac was completely dissected out of the canal. Vas deference and contents of the cord were safely dissected away of the hernia sac. The Exparel mix was infused along the transversus abdominis planes laterally and near the lacunar ligament medially.  A peritoneal flap was created on the right with harmonic scalpel. Next I began our dissection on the right, identifying the ASIS laterally and then working back medially removing the filmy tissue and adhesions of the peritoneum to the  abdominal wall. Hernia sac was completely dissected out of the canal. Vas deference and contents of the cord were safely dissected away of the hernia sac. The Exparel mix was infused along the transversus abdominis planes laterally and near the lacunar ligament medially.  A large left 3D max mesh was inserted and tacked medially to the lacunar ligament. A large right 3D max mesh was inserted and tacked medially to the lacunar ligament.. The mesh was positioned flat and directly up against the direct and indirect areas. The peritoneal flaps were re-apposed to the abdominal wall with additional tacks. The 12 mm fascial defect was closed with 0 vicryl with suture passer.  The CO2 was evacuated while watching to ensure the mesh did not migrate.. The anterior rectus fascia was closed with 0 vicryl in interrupted sutures and all skin incisions were closed with 4-0 monocryl subcu stitch. The patient awoke from anesthesia and was brought to PACU in stable condition.  Findings: bilateral indirect inguinal hernia  Specimen: none  Blood loss: 10 ml  Local anesthesia: 50 ml Exparel:Marcaine mix  Complications: none  Implant: right and left large Bard 3D max mesh  LGurney Maxin M.D. General, Bariatric, & Minimally Invasive Surgery [facility]arker [facility], [id number] AM 09/25/2020

## 2020-09-25 NOTE — Anesthesia Preprocedure Evaluation (Addendum)
Anesthesia Evaluation  Patient identified by MRN, date of birth, ID band Patient awake    Reviewed: Allergy & Precautions, H&P , NPO status , Patient's Chart, lab work & pertinent test results, reviewed documented beta blocker date and time   History of Anesthesia Complications (+) PONV and history of anesthetic complications  Airway Mallampati: I  TM Distance: >3 FB Neck ROM: full    Dental no notable dental hx. (+) Teeth Intact, Dental Advisory Given   Pulmonary neg pulmonary ROS, former smoker,    Pulmonary exam normal breath sounds clear to auscultation       Cardiovascular Exercise Tolerance: Good negative cardio ROS   Rhythm:regular Rate:Normal     Neuro/Psych  Headaches, negative psych ROS   GI/Hepatic negative GI ROS, Neg liver ROS,   Endo/Other  negative endocrine ROS  Renal/GU negative Renal ROS  negative genitourinary   Musculoskeletal  (+) Arthritis , Osteoarthritis,    Abdominal   Peds  Hematology negative hematology ROS (+)   Anesthesia Other Findings   Reproductive/Obstetrics negative OB ROS                            Anesthesia Physical Anesthesia Plan  ASA: 2  Anesthesia Plan: General   Post-op Pain Management:    Induction: Intravenous  PONV Risk Score and Plan: 3 and Ondansetron and Dexamethasone  Airway Management Planned: Oral ETT and LMA  Additional Equipment: None  Intra-op Plan:   Post-operative Plan: Extubation in OR  Informed Consent: I have reviewed the patients History and Physical, chart, labs and discussed the procedure including the risks, benefits and alternatives for the proposed anesthesia with the patient or authorized representative who has indicated his/her understanding and acceptance.     Dental Advisory Given  Plan Discussed with: CRNA and Anesthesiologist  Anesthesia Plan Comments: (  )       Anesthesia Quick  Evaluation

## 2020-09-25 NOTE — Transfer of Care (Signed)
Immediate Anesthesia Transfer of Care Note  Patient: Audrey Tucker  Procedure(s) Performed: LAPAROSCOPIC Bilateral INGUINAL HERNIA REPAIR WITH MESH (Bilateral)  Patient Location: PACU  Anesthesia Type:General  Level of Consciousness: awake, alert  and oriented  Airway & Oxygen Therapy: Patient Spontanous Breathing and Patient connected to face mask oxygen  Post-op Assessment: Report given to RN and Post -op Vital signs reviewed and stable  Post vital signs: Reviewed and stable  Last Vitals:  Vitals Value Taken Time  BP 148/71 09/25/20 1111  Temp    Pulse 76 09/25/20 1112  Resp 22 09/25/20 1112  SpO2 98 % 09/25/20 1112  Vitals shown include unvalidated device data.  Last Pain:  Vitals:   09/25/20 0830  TempSrc:   PainSc: 0-No pain      Patients Stated Pain Goal: 4 ([id number])  Complications: No notable events documented.

## 2020-09-25 NOTE — Discharge Instructions (Signed)
[facility] _____[facility]  UMBILICAL OR INGUINAL HERNIA REPAIR: POST OP INSTRUCTIONS  Always review your discharge instruction sheet given to you by the facility where your surgery was performed. IF YOU HAVE DISABILITY OR FAMILY LEAVE FORMS, YOU MUST BRING THEM TO THE OFFICE FOR PROCESSING.   DO NOT GIVE THEM TO YOUR DOCTOR.  1. A  prescription for pain medication may be given to you upon discharge.  Take your pain medication as prescribed, if needed.  If narcotic pain medicine is not needed, then you may take acetaminophen (Tylenol) or ibuprofen (Advil) as needed. 2. Take your usually prescribed medications unless otherwise directed. If you need a refill on your pain medication, please contact your pharmacy.  They will contact our office to request authorization. Prescriptions will not be filled after 5 pm or on week-ends. 3. You should follow a light diet the first 24 hours after arrival home, such as soup and crackers, etc.  Be sure to include lots of fluids daily.  Resume your normal diet the day after surgery. 4.Most patients will experience some swelling and bruising around the umbilicus or in the groin and scrotum.  Ice packs and reclining will help.  Swelling and bruising can take several days to resolve.  6. It is common to experience some constipation if taking pain medication after surgery.  Increasing fluid intake and taking a stool softener (such as Colace) will usually help or prevent this problem from occurring.  A mild laxative (Milk of Magnesia or Miralax) should be taken according to package directions if there are no bowel movements after 48 hours. 7. Unless discharge instructions indicate otherwise, you may remove your bandages 24-48 hours after surgery, and you may shower at that time.  You may have steri-strips (small skin tapes) in place directly over the incision.  These strips should be left on the skin for 7-10 days.  If your surgeon used skin glue on the  incision, you may shower in 24 hours.  The glue will flake off over the next 2-3 weeks.  Any sutures or staples will be removed at the office during your follow-up visit. 8. ACTIVITIES:  You may resume regular (light) daily activities beginning the next day--such as daily self-care, walking, climbing stairs--gradually increasing activities as tolerated.  You may have sexual intercourse when it is comfortable.  Refrain from any heavy lifting or straining until approved by your doctor.  a.You may drive when you are no longer taking prescription pain medication, you can comfortably wear a seatbelt, and you can safely maneuver your car and apply brakes. b.RETURN TO WORK:   _____________________________________________  9.You should see your doctor in the office for a follow-up appointment approximately 2-3 weeks after your surgery.  Make sure that you call for this appointment within a day or two after you arrive home to insure a convenient appointment time. 10.OTHER INSTRUCTIONS: _________________________    _____________________________________  WHEN TO CALL YOUR DOCTOR: Fever over 101.0 Inability to urinate Nausea and/or vomiting Extreme swelling or bruising Continued bleeding from incision. Increased pain, redness, or drainage from the incision  The clinic staff is available to answer your questions during regular business hours.  Please don't hesitate to call and ask to speak to one of the nurses for clinical concerns.  If you have a medical emergency, go to the nearest emergency room or call 911.  A surgeon from [facility] is always on call at the hospital   [address], [address],   [place], [place]  [address] ?  [address] [address], [place], [place]   [address] [phone] ? [phone] ? FAX [phone] Web site: [email or web address]  

## 2020-09-25 NOTE — Anesthesia Postprocedure Evaluation (Signed)
Anesthesia Post Note  Patient: Audrey Tucker  Procedure(s) Performed: LAPAROSCOPIC Bilateral INGUINAL HERNIA REPAIR WITH MESH (Bilateral)     Patient location during evaluation: PACU Anesthesia Type: General Level of consciousness: awake and alert Pain management: pain level controlled Vital Signs Assessment: post-procedure vital signs reviewed and stable Respiratory status: spontaneous breathing, nonlabored ventilation, respiratory function stable and patient connected to nasal cannula oxygen Cardiovascular status: blood pressure returned to baseline and stable Postop Assessment: no apparent nausea or vomiting Anesthetic complications: no   No notable events documented.  Last Vitals:  Vitals:   09/25/20 1115 09/25/20 1130  BP: (!) 141/71 (!) 106/50  Pulse: 74 73  Resp: 13 16  Temp:    SpO2: 98% 92%    Last Pain:  Vitals:   09/25/20 1130  TempSrc:   PainSc: Asleep                 Gerod Caligiuri

## 2020-09-26 ENCOUNTER — Encounter (HOSPITAL_COMMUNITY): Payer: Self-pay | Admitting: General Surgery

## 2020-12-17 DIAGNOSIS — L814 Other melanin hyperpigmentation: Secondary | ICD-10-CM | POA: Diagnosis not present

## 2020-12-17 DIAGNOSIS — L821 Other seborrheic keratosis: Secondary | ICD-10-CM | POA: Diagnosis not present

## 2020-12-17 DIAGNOSIS — L570 Actinic keratosis: Secondary | ICD-10-CM | POA: Diagnosis not present

## 2020-12-17 DIAGNOSIS — D485 Neoplasm of uncertain behavior of skin: Secondary | ICD-10-CM | POA: Diagnosis not present

## 2020-12-17 DIAGNOSIS — Z85828 Personal history of other malignant neoplasm of skin: Secondary | ICD-10-CM | POA: Diagnosis not present

## 2021-03-11 DIAGNOSIS — Z6821 Body mass index (BMI) 21.0-21.9, adult: Secondary | ICD-10-CM | POA: Diagnosis not present

## 2021-03-11 DIAGNOSIS — N958 Other specified menopausal and perimenopausal disorders: Secondary | ICD-10-CM | POA: Diagnosis not present

## 2021-03-11 DIAGNOSIS — Z8262 Family history of osteoporosis: Secondary | ICD-10-CM | POA: Diagnosis not present

## 2021-03-11 DIAGNOSIS — F329 Major depressive disorder, single episode, unspecified: Secondary | ICD-10-CM | POA: Diagnosis not present

## 2021-03-11 DIAGNOSIS — Z124 Encounter for screening for malignant neoplasm of cervix: Secondary | ICD-10-CM | POA: Diagnosis not present

## 2021-03-11 DIAGNOSIS — Z1231 Encounter for screening mammogram for malignant neoplasm of breast: Secondary | ICD-10-CM | POA: Diagnosis not present

## 2021-03-11 DIAGNOSIS — M8588 Other specified disorders of bone density and structure, other site: Secondary | ICD-10-CM | POA: Diagnosis not present

## 2021-03-18 DIAGNOSIS — D485 Neoplasm of uncertain behavior of skin: Secondary | ICD-10-CM | POA: Diagnosis not present

## 2021-03-18 DIAGNOSIS — Z85828 Personal history of other malignant neoplasm of skin: Secondary | ICD-10-CM | POA: Diagnosis not present

## 2021-03-18 DIAGNOSIS — L821 Other seborrheic keratosis: Secondary | ICD-10-CM | POA: Diagnosis not present

## 2021-03-18 DIAGNOSIS — L570 Actinic keratosis: Secondary | ICD-10-CM | POA: Diagnosis not present

## 2021-03-26 ENCOUNTER — Other Ambulatory Visit: Payer: Self-pay

## 2021-03-26 ENCOUNTER — Encounter: Payer: Self-pay | Admitting: Orthopaedic Surgery

## 2021-03-26 ENCOUNTER — Ambulatory Visit: Payer: PPO | Admitting: Orthopaedic Surgery

## 2021-03-26 DIAGNOSIS — M7062 Trochanteric bursitis, left hip: Secondary | ICD-10-CM | POA: Diagnosis not present

## 2021-03-26 MED ORDER — LIDOCAINE HCL 1 % IJ SOLN
3.0000 mL | INTRAMUSCULAR | Status: AC | PRN
  Administered 2021-03-26: 3 mL

## 2021-03-26 MED ORDER — METHYLPREDNISOLONE ACETATE 40 MG/ML IJ SUSP
40.0000 mg | INTRAMUSCULAR | Status: AC | PRN
Start: 2021-03-26 — End: 2021-03-26
  Administered 2021-03-26: 40 mg via INTRA_ARTICULAR

## 2021-03-26 NOTE — Progress Notes (Signed)
Office Visit Note   Patient: Audrey Tucker           Date of Birth: [birth year]           MRN: [id number] Visit Date: 03/26/2021              Requested by: Martinique, Betty G, MD [address] [place],  [place] [address] PCP: Martinique, Betty G, MD   Assessment & Plan: Visit Diagnoses:  1. Trochanteric bursitis, left hip     Plan: I did place a steroid injection per her request of her left hip trochanteric area and again showed her stretching exercises.  I am not sure what to recommend for her left shin area.  I do not think an MRI would even show anything based on what she describes.  All questions and concerns were answered addressed.  Follow-up is as needed.  Follow-Up Instructions: Return if symptoms worsen or fail to improve.   Orders:  Orders Placed This Encounter  Procedures   Large Joint Inj   No orders of the defined types were placed in this encounter.     Procedures: Large Joint Inj: L greater trochanter on 03/26/2021 3:53 PM Indications: pain and diagnostic evaluation Details: 22 G 1.5 in needle, lateral approach  Arthrogram: No  Medications: 3 mL lidocaine 1 %; 40 mg methylPREDNISolone acetate 40 MG/ML Outcome: tolerated well, no immediate complications Procedure, treatment alternatives, risks and benefits explained, specific risks discussed. Consent was given by the patient. Immediately prior to procedure a time out was called to verify the correct patient, procedure, equipment, support staff and site/side marked as required. Patient was prepped and draped in the usual sterile fashion.      Clinical Data: No additional findings.   Subjective: No chief complaint on file. The patient is somewhat seen before.  She is 76 years old and very active.  She does play pickle ball.  She has been dealing with left hip pain over the trochanteric area for some time now.  Is been going on for a few years and I have injected the trochanteric area before which is helped  her greatly.  He does not do stretching exercises like she should but does stay very active.  She is thin.  She plays a lot of pickleball.  She is also has been dealing with occasional anterior shin pain on the right side.  Someone worked this up before with the MRI of her back which was negative but what she describes more of a muscle or tendon issue which she is sometimes has cramping in the front of her leg.  I gave her reassurance that it is not a blood clot.  She still denies any groin pain and would like to have a steroid injection of her trochanteric area today.  Is been very long since we have done that and that is helped in the past.  This is on the left side.  There is been no acute change in her medical status.  HPI  Review of Systems There is no listed fever, chills, nausea, vomiting  Objective: Vital Signs: There were no vitals taken for this visit.  Physical Exam She is alert and orient x3 and in no acute distress Ortho Exam Examination of her left hip shows excellent and full range of motion with no pain in the groin at all.  There is only pain over the trochanteric area.  There is no instability on exam.  I did examine her right  calf and lower leg area.  Her calf is soft and there is no evidence of DVT.  Her pain is in the front of her shin area and I cannot reproduce it but it seems to be on the muscles just in the medial lateral aspect of the tibia and she says sometimes it happens in the morning where she has to walk on her heel and she knows this can happen when she walks it off.  Remainder of her exam is normal. Specialty Comments:  No specialty comments available.  Imaging: No results found.   PMFS History: Patient Active Problem List   Diagnosis Date Noted   Abdominal aortic atherosclerosis ([place]) 05/16/2020   Insomnia 05/16/2020   Seasonal allergies 05/16/2020   RLS (restless legs syndrome) 12/31/2016   Trochanteric bursitis of both hips 08/09/2016   Generalized  osteoarthritis of hand 07/27/2016   Headache, variant migraine 07/26/2016   Past Medical History:  Diagnosis Date   Adenomatous colon polyp    tubular   Arthritis    Chicken pox    Diverticulosis    Hernia, femoral    pt stated surgery in august of 2022, on left side    PONV (postoperative nausea and vomiting)    Seasonal allergies     Family History  Problem Relation Age of Onset   Arthritis Mother    Hypertension Mother    Arthritis Father    Cancer Father        lung    Past Surgical History:  Procedure Laterality Date   EYE SURGERY Bilateral    cataract   FOOT NEUROMA SURGERY Right 04/29/2015   Dr Rulon Sera HERNIA REPAIR Bilateral 09/25/2020   Procedure: LAPAROSCOPIC Bilateral INGUINAL HERNIA REPAIR WITH MESH;  Surgeon: Kinsinger, Arta Bruce, MD;  Location: [facility];  Service: General;  Laterality: Bilateral;   right hand surgery      SHOULDER ARTHROSCOPY Left    Social History   Occupational History   Occupation: works occasionally     Comment: retired   Tobacco Use   Smoking status: Former    Types: Cigarettes    Quit date: 04/05/1967    Years since quitting: 54.0   Smokeless tobacco: Never   Tobacco comments:    rare use in college   Vaping Use   Vaping Use: Never used  Substance and Sexual Activity   Alcohol use: Yes    Comment: social   Drug use: No   Sexual activity: Not on file

## 2021-07-29 ENCOUNTER — Ambulatory Visit: Payer: PPO

## 2021-08-05 ENCOUNTER — Ambulatory Visit (INDEPENDENT_AMBULATORY_CARE_PROVIDER_SITE_OTHER): Payer: PPO

## 2021-08-05 VITALS — BP 120/62 | HR 62 | Temp 98.6°F | Ht 64.0 in | Wt 123.5 lb

## 2021-08-05 DIAGNOSIS — Z0000 Encounter for general adult medical examination without abnormal findings: Secondary | ICD-10-CM | POA: Diagnosis not present

## 2021-08-05 NOTE — Progress Notes (Cosign Needed)
Subjective:   Audrey Tucker is a 76 y.o. female who presents for Medicare Annual (Subsequent) preventive examination.  Review of Systems        Objective:    Today's Vitals   08/05/21 1350  BP: 120/62  Pulse: 62  Temp: 98.6 F (37 C)  TempSrc: Oral  SpO2: 97%  Weight: 123 lb 8 oz (56 kg)  Height: '5\' 4"'$  (1.626 m)   Body mass index is 21.2 kg/m.     08/05/2021    2:03 PM 09/17/2020    9:21 AM 07/23/2020    8:50 AM 02/22/2019    9:26 AM 12/07/2017    8:13 AM 05/16/2015    3:35 PM  Advanced Directives  Does Patient Have a Medical Advance Directive? Yes No;Yes Yes Yes Yes Yes  Type of Paramedic of [place];Living will [place];Living will [place];Living will [place];Living will  [place]  Does patient want to make changes to medical advance directive? No - Patient declined   No - Patient declined    Copy of [place] in Chart? No - copy requested  No - copy requested No - copy requested      Current Medications (verified) Outpatient Encounter Medications as of 08/05/2021  Medication Sig   calcium-vitamin D 250-100 MG-UNIT tablet Take 1 tablet by mouth 4 (four) times a week.   ibuprofen (ADVIL) 800 MG tablet Take 1 tablet (800 mg total) by mouth every 8 (eight) hours as needed.   LORazepam (ATIVAN) 0.5 MG tablet TAKE 1 TABLET BY MOUTH AS NEEDED FOR ANXIETY/RLS   oxyCODONE (OXY IR/ROXICODONE) 5 MG immediate release tablet Take 1 tablet (5 mg total) by mouth every 6 (six) hours as needed for severe pain.   PARoxetine (PAXIL) 10 MG tablet Take 10 mg by mouth every evening.   Facility-Administered Encounter Medications as of 08/05/2021  Medication   0.9 %  sodium chloride infusion    Allergies (verified) Patient has no known allergies.   History: Past Medical History:  Diagnosis Date   Adenomatous colon polyp    tubular   Arthritis    Chicken pox     Diverticulosis    Hernia, femoral    pt stated surgery in august of 2022, on left side    PONV (postoperative nausea and vomiting)    Seasonal allergies    Past Surgical History:  Procedure Laterality Date   EYE SURGERY Bilateral    cataract   FOOT NEUROMA SURGERY Right 04/29/2015   Dr Rulon Sera HERNIA REPAIR Bilateral 09/25/2020   Procedure: LAPAROSCOPIC Bilateral INGUINAL HERNIA REPAIR WITH MESH;  Surgeon: Kinsinger, Arta Bruce, MD;  Location: [facility];  Service: General;  Laterality: Bilateral;   right hand surgery      SHOULDER ARTHROSCOPY Left    Family History  Problem Relation Age of Onset   Arthritis Mother    Hypertension Mother    Arthritis Father    Cancer Father        lung   Social History   Socioeconomic History   Marital status: Married    Spouse name: Not on file   Number of children: 0   Years of education: 16   Highest education level: Bachelor's degree (e.g., BA, AB, BS)  Occupational History   Occupation: works occasionally     Comment: retired   Tobacco Use   Smoking status: Former    Types:  Cigarettes    Quit date: 04/05/1967    Years since quitting: 54.3   Smokeless tobacco: Never   Tobacco comments:    rare use in college   Vaping Use   Vaping Use: Never used  Substance and Sexual Activity   Alcohol use: Yes    Comment: social   Drug use: No   Sexual activity: Not on file  Other Topics Concern   Not on file  Social History Narrative   Married   No children   HH 2   Yard work, Designer, television/film set   Works occasionally    Social Determinants of Health   Financial Resource Strain: Low Risk  (08/05/2021)   Overall Financial Resource Strain (CARDIA)    Difficulty of Paying Living Expenses: Not hard at all  Food Insecurity: No Food Insecurity (08/05/2021)   Hunger Vital Sign    Worried About Running Out of Food in the Last Year: Never true    [place] in the Last Year: Never true  Transportation Needs: No Transportation Needs  (08/05/2021)   PRAPARE - Hydrologist (Medical): No    Lack of Transportation (Non-Medical): No  Physical Activity: Insufficiently Active (08/05/2021)   Exercise Vital Sign    Days of Exercise per Week: 3 days    Minutes of Exercise per Session: 30 min  Stress: No Stress Concern Present (08/05/2021)   [place]    Feeling of Stress : Not at all  Social Connections: [place] (08/05/2021)   Social Connection and Isolation Panel [NHANES]    Frequency of Communication with Friends and Family: More than three times a week    Frequency of Social Gatherings with Friends and Family: More than three times a week    Attends Religious Services: More than 4 times per year    Active Member of [employer] or Organizations: Yes    Attends Music therapist: More than 4 times per year    Marital Status: Married    Tobacco Counseling Counseling given: Not Answered Tobacco comments: rare use in college    Clinical Intake: Nutritional Risks: None Diabetes: No  How often do you need to have someone help you when you read instructions, pamphlets, or other written materials from your doctor or pharmacy?: 1 - Never  Diabetic?  No   Activities of Daily Living    08/05/2021    2:00 PM 08/04/2021    9:47 AM  In your present state of health, do you have any difficulty performing the following activities:  Hearing? 1 1  Comment Pending hearing aids   Vision? 0 0  Difficulty concentrating or making decisions? 0 0  Walking or climbing stairs? 0 0  Dressing or bathing? 0 0  Doing errands, shopping? 0 0  Preparing Food and eating ? N N  Using the Toilet? N N  In the past six months, have you accidently leaked urine? N Y  Do you have problems with loss of bowel control? N N  Managing your Medications? N N  Managing your Finances? N N  Housekeeping or managing your Housekeeping? N N     Patient Care Team: Martinique, Betty G, MD as PCP - General (Family Medicine)  Indicate any recent Medical Services you may have received from other than [facility] providers in the past year (date may be approximate).     Assessment:   This is a routine wellness examination  for Audrey Tucker.  Hearing/Vision screen Hearing Screening - Comments:: No hearing difficulty Vision Screening - Comments:: Wears reading glasses. Followed by Dr Nicki Reaper  Dietary issues and exercise activities discussed: Exercise limited by: None identified   Goals Addressed               This Visit's Progress     Patient Stated (pt-stated)        Finish projects.       Depression Screen    08/05/2021    1:58 PM 07/23/2020    8:48 AM 05/16/2020   10:21 AM 02/22/2019    9:29 AM 12/07/2017    8:16 AM  PHQ 2/9 Scores  PHQ - 2 Score 0 0 0 0 0    Fall Risk    08/05/2021    2:02 PM 08/04/2021    9:47 AM 07/23/2020    8:51 AM 05/16/2020    9:21 AM 02/22/2019    9:29 AM  Fall Risk   Falls in the past year? 0 0 0 0 0  Number falls in past yr: 0 0 0    Injury with Fall? 0 0 0    Risk for fall due to : No Fall Risks  Impaired vision  Medication side effect  Follow up   Falls prevention discussed  Falls evaluation completed;Education provided;Falls prevention discussed    FALL RISK PREVENTION PERTAINING TO THE HOME:  Any stairs in or around the home? Yes  If so, are there any without handrails? No  Home free of loose throw rugs in walkways, pet beds, electrical cords, etc? Yes  Adequate lighting in your home to reduce risk of falls? Yes   ASSISTIVE DEVICES UTILIZED TO PREVENT FALLS:  Life alert? No  Use of a cane, walker or w/c? No  Grab bars in the bathroom? No  Shower chair or bench in shower? No  Elevated toilet seat or a handicapped toilet? No   TIMED UP AND GO:  Was the test performed? Yes .  Length of time to ambulate 10 feet: 5 sec.   Gait steady and fast without use of assistive device  Cognitive  Function:        08/05/2021    2:03 PM 07/23/2020    8:53 AM 02/22/2019    9:31 AM  6CIT Screen  What Year? 0 points 0 points 0 points  What month? 0 points 0 points 0 points  What time? 0 points 0 points 0 points  Count back from 20 0 points 0 points 0 points  Months in reverse 0 points 0 points 0 points  Repeat phrase 0 points 0 points 0 points  Total Score 0 points 0 points 0 points    Immunizations Immunization History  Administered Date(s) Administered   Influenza Whole 12/14/2006   Influenza-Unspecified 11/29/2013   Pneumococcal Polysaccharide-23 02/27/2019   Tdap 02/15/2014    TDAP status: Up to date    Pneumococcal vaccine status: Up to date  Covid-19 vaccine status: Completed vaccines  Qualifies for Shingles Vaccine? Yes   Zostavax completed No   Shingrix Completed?: No.    Education has been provided regarding the importance of this vaccine. Patient has been advised to call insurance company to determine out of pocket expense if they have not yet received this vaccine. Advised may also receive vaccine at local pharmacy or Health Dept. Verbalized acceptance and understanding.  Screening Tests Health Maintenance  Topic Date Due   COVID-19 Vaccine (1) 08/21/2021 (Originally 04/22/1946)   Zoster  Vaccines- Shingrix (1 of 2) 11/05/2021 (Originally 10/23/1995)   Pneumonia Vaccine 63+ Years old (2 - PCV) 08/06/2022 (Originally 02/27/2020)   INFLUENZA VACCINE  12/15/2028 (Originally 09/15/2021)   COLONOSCOPY (Pts 45-37yr Insurance coverage will need to be confirmed)  03/01/2022   TETANUS/TDAP  02/16/2024   DEXA SCAN  Completed   Hepatitis C Screening  Completed   HPV VACCINES  Aged Out    Health Maintenance  There are no preventive care reminders to display for this patient.   Colorectal cancer screening: Type of screening: Colonoscopy. Completed 03/01/17. Repeat every 5 years  Mammogram status: Completed 03/11/21. Repeat every year  Dexa Scan: Completed  03/11/21  Lung Cancer Screening: (Low Dose CT Chest recommended if Age 76-80years, 30 pack-year currently smoking OR have quit w/in 15years.) does not qualify.     Additional Screening:  Hepatitis C Screening: does qualify; Completed 01/11/18  Vision Screening: Recommended annual ophthalmology exams for early detection of glaucoma and other disorders of the eye. Is the patient up to date with their annual eye exam?  Yes  Who is the provider or what is the name of the office in which the patient attends annual eye exams? Dr SNicki ReaperIf pt is not established with a provider, would they like to be referred to a provider to establish care? No .   Dental Screening: Recommended annual dental exams for proper oral hygiene  Community Resource Referral / Chronic Care Management:   CRR required this visit?  No   CCM required this visit?  No      Plan:     I have personally reviewed and noted the following in the patient's chart:   Medical and social history Use of alcohol, tobacco or illicit drugs  Current medications and supplements including opioid prescriptions.  Functional ability and status Nutritional status Physical activity Advanced directives List of other physicians Hospitalizations, surgeries, and ER visits in previous 12 months Vitals Screenings to include cognitive, depression, and falls Referrals and appointments  In addition, I have reviewed and discussed with patient certain preventive protocols, quality metrics, and best practice recommendations. A written personalized care plan for preventive services as well as general preventive health recommendations were provided to patient.     BCriselda Peaches LPN   [id number]  Nurse Notes: None

## 2021-08-05 NOTE — Patient Instructions (Addendum)
Audrey Tucker , Thank you for taking time to come for your Medicare Wellness Visit. I appreciate your ongoing commitment to your health goals. Please review the following plan we discussed and let me know if I can assist you in the future.   These are the goals we discussed:  Goals       Patient Stated (pt-stated)      Finish projects.      Patient Stated      Exercise more        This is a list of the screening recommended for you and due dates:  Health Maintenance  Topic Date Due   COVID-19 Vaccine (1) 08/21/2021*   Zoster (Shingles) Vaccine (1 of 2) 11/05/2021*   Pneumonia Vaccine (2 - PCV) 08/06/2022*   Flu Shot  12/15/2028*   Colon Cancer Screening  03/01/2022   Tetanus Vaccine  02/16/2024   DEXA scan (bone density measurement)  Completed   Hepatitis C Screening: USPSTF Recommendation to screen - Ages 59-79 yo.  Completed   HPV Vaccine  Aged Out  *Topic was postponed. The date shown is not the original due date.   Opioid Pain Medicine Management Opioids are powerful medicines that are used to treat moderate to severe pain. When used for short periods of time, they can help you to: Sleep better. Do better in physical or occupational therapy. Feel better in the first few days after an injury. Recover from surgery. Opioids should be taken with the supervision of a trained health care provider. They should be taken for the shortest period of time possible. This is because opioids can be addictive, and the longer you take opioids, the greater your risk of addiction. This addiction can also be called opioid use disorder. What are the risks? Using opioid pain medicines for longer than 3 days increases your risk of side effects. Side effects include: Constipation. Nausea and vomiting. Breathing difficulties (respiratory depression). Drowsiness. Confusion. Opioid use disorder. Itching. Taking opioid pain medicine for a long period of time can affect your ability to do daily  tasks. It also puts you at risk for: Motor vehicle crashes. Depression. Suicide. Heart attack. Overdose, which can be life-threatening. What is a pain treatment plan? A pain treatment plan is an agreement between you and your health care provider. Pain is unique to each person, and treatments vary depending on your condition. To manage your pain, you and your health care provider need to work together. To help you do this: Discuss the goals of your treatment, including how much pain you might expect to have and how you will manage the pain. Review the risks and benefits of taking opioid medicines. Remember that a good treatment plan uses more than one approach and minimizes the chance of side effects. Be honest about the amount of medicines you take and about any drug or alcohol use. Get pain medicine prescriptions from only one health care provider. Pain can be managed with many types of alternative treatments. Ask your health care provider to refer you to one or more specialists who can help you manage pain through: Physical or occupational therapy. Counseling (cognitive behavioral therapy). Good nutrition. Biofeedback. Massage. Meditation. Non-opioid medicine. Following a gentle exercise program. How to use opioid pain medicine Taking medicine Take your pain medicine exactly as told by your health care provider. Take it only when you need it. If your pain gets less severe, you may take less than your prescribed dose if your health care provider approves. If  you are not having pain, do nottake pain medicine unless your health care provider tells you to take it. If your pain is severe, do nottry to treat it yourself by taking more pills than instructed on your prescription. Contact your health care provider for help. Write down the times when you take your pain medicine. It is easy to become confused while on pain medicine. Writing the time can help you avoid overdose. Take other  over-the-counter or prescription medicines only as told by your health care provider. Keeping yourself and others safe  While you are taking opioid pain medicine: Do not drive, use machinery, or power tools. Do not sign legal documents. Do not drink alcohol. Do not take sleeping pills. Do not supervise children by yourself. Do not do activities that require climbing or being in high places. Do not go to a lake, river, ocean, spa, or swimming pool. Do not share your pain medicine with anyone. Keep pain medicine in a locked cabinet or in a secure area where pets and children cannot reach it. Stopping your use of opioids If you have been taking opioid medicine for more than a few weeks, you may need to slowly decrease (taper) how much you take until you stop completely. Tapering your use of opioids can decrease your risk of symptoms of withdrawal, such as: Pain and cramping in the abdomen. Nausea. Sweating. Sleepiness. Restlessness. Uncontrollable shaking (tremors). Cravings for the medicine. Do not attempt to taper your use of opioids on your own. Talk with your health care provider about how to do this. Your health care provider may prescribe a step-down schedule based on how much medicine you are taking and how long you have been taking it. Getting rid of leftover pills Do not save any leftover pills. Get rid of leftover pills safely by: Taking the medicine to a prescription take-back program. This is usually offered by the county or law enforcement. Bringing them to a pharmacy that has a drug disposal container. Flushing them down the toilet. Check the label or package insert of your medicine to see whether this is safe to do. Throwing them out in the trash. Check the label or package insert of your medicine to see whether this is safe to do. If it is safe to throw it out, remove the medicine from the original container, put it into a sealable bag or container, and mix it with used  coffee grounds, food scraps, dirt, or cat litter before putting it in the trash. Follow these instructions at home: Activity Do exercises as told by your health care provider. Avoid activities that make your pain worse. Return to your normal activities as told by your health care provider. Ask your health care provider what activities are safe for you. General instructions You may need to take these actions to prevent or treat constipation: Drink enough fluid to keep your urine pale yellow. Take over-the-counter or prescription medicines. Eat foods that are high in fiber, such as beans, whole grains, and fresh fruits and vegetables. Limit foods that are high in fat and processed sugars, such as fried or sweet foods. Keep all follow-up visits. This is important. Where to find support If you have been taking opioids for a long time, you may benefit from receiving support for quitting from a local support group or counselor. Ask your health care provider for a referral to these resources in your area. Where to find more information Centers for Disease Control and Prevention (CDC): http://www.wolf.info/ [place] Food  and Drug Administration (FDA): [place] Get help right away if: You may have taken too much of an opioid (overdosed). Common symptoms of an overdose: Your breathing is slower or more shallow than normal. You have a very slow heartbeat (pulse). You have slurred speech. You have nausea and vomiting. Your pupils become very small. You have other potential symptoms: You are very confused. You faint or feel like you will faint. You have cold, clammy skin. You have blue lips or fingernails. You have thoughts of harming yourself or harming others. These symptoms may represent a serious problem that is an emergency. Do not wait to see if the symptoms will go away. Get medical help right away. Call your local emergency services (911 in the [place] Do not drive yourself to the hospital.  If you  ever feel like you may hurt yourself or others, or have thoughts about taking your own life, get help right away. Go to your nearest emergency department or: Call your local emergency services (911 in the [place] Call the [facility] [phone] in the [place] Call a suicide crisis helpline, such as the [place] at 217 577 9051 or 988 in the [place]. This is open 24 hours a day in the [place] Text the Crisis Text Line at 5796982313 (in the [place].). Summary Opioid medicines can help you manage moderate to severe pain for a short period of time. A pain treatment plan is an agreement between you and your health care provider. Discuss the goals of your treatment, including how much pain you might expect to have and how you will manage the pain. If you think that you or someone else may have taken too much of an opioid, get medical help right away. This information is not intended to replace advice given to you by your health care provider. Make sure you discuss any questions you have with your health care provider. Document Revised: 08/27/2020 Document Reviewed: 05/14/2020 Elsevier Patient Education  [place] directives: Yes  Conditions/risks identified: None  Next appointment: Follow up in one year for your annual wellness visit     Preventive Care 65 Years and Older, Female Preventive care refers to lifestyle choices and visits with your health care provider that can promote health and wellness. What does preventive care include? A yearly physical exam. This is also called an annual well check. Dental exams once or twice a year. Routine eye exams. Ask your health care provider how often you should have your eyes checked. Personal lifestyle choices, including: Daily care of your teeth and gums. Regular physical activity. Eating a healthy diet. Avoiding tobacco and drug use. Limiting alcohol use. Practicing safe sex. Taking  low-dose aspirin every day. Taking vitamin and mineral supplements as recommended by your health care provider. What happens during an annual well check? The services and screenings done by your health care provider during your annual well check will depend on your age, overall health, lifestyle risk factors, and family history of disease. Counseling  Your health care provider may ask you questions about your: Alcohol use. Tobacco use. Drug use. Emotional well-being. Home and relationship well-being. Sexual activity. Eating habits. History of falls. Memory and ability to understand (cognition). Work and work Statistician. Reproductive health. Screening  You may have the following tests or measurements: Height, weight, and BMI. Blood pressure. Lipid and cholesterol levels. These may be checked every 5 years, or more frequently if you are over 69 years old. Skin check.  Lung cancer screening. You may have this screening every year starting at age 80 if you have a 30-pack-year history of smoking and currently smoke or have quit within the past 15 years. Fecal occult blood test (FOBT) of the stool. You may have this test every year starting at age 21. Flexible sigmoidoscopy or colonoscopy. You may have a sigmoidoscopy every 5 years or a colonoscopy every 10 years starting at age 29. Hepatitis C blood test. Hepatitis B blood test. Sexually transmitted disease (STD) testing. Diabetes screening. This is done by checking your blood sugar (glucose) after you have not eaten for a while (fasting). You may have this done every 1-3 years. Bone density scan. This is done to screen for osteoporosis. You may have this done starting at age 66. Mammogram. This may be done every 1-2 years. Talk to your health care provider about how often you should have regular mammograms. Talk with your health care provider about your test results, treatment options, and if necessary, the need for more tests. Vaccines   Your health care provider may recommend certain vaccines, such as: Influenza vaccine. This is recommended every year. Tetanus, diphtheria, and acellular pertussis (Tdap, Td) vaccine. You may need a Td booster every 10 years. Zoster vaccine. You may need this after age 59. Pneumococcal 13-valent conjugate (PCV13) vaccine. One dose is recommended after age 57. Pneumococcal polysaccharide (PPSV23) vaccine. One dose is recommended after age 31. Talk to your health care provider about which screenings and vaccines you need and how often you need them. This information is not intended to replace advice given to you by your health care provider. Make sure you discuss any questions you have with your health care provider. Document Released: 02/28/2015 Document Revised: 10/22/2015 Document Reviewed: 12/03/2014 Elsevier Interactive Patient Education  2017 [place] Prevention in the Home Falls can cause injuries. They can happen to people of all ages. There are many things you can do to make your home safe and to help prevent falls. What can I do on the outside of my home? Regularly fix the edges of walkways and driveways and fix any cracks. Remove anything that might make you trip as you walk through a door, such as a raised step or threshold. Trim any bushes or trees on the path to your home. Use bright outdoor lighting. Clear any walking paths of anything that might make someone trip, such as rocks or tools. Regularly check to see if handrails are loose or broken. Make sure that both sides of any steps have handrails. Any raised decks and porches should have guardrails on the edges. Have any leaves, snow, or ice cleared regularly. Use sand or salt on walking paths during winter. Clean up any spills in your garage right away. This includes oil or grease spills. What can I do in the bathroom? Use night lights. Install grab bars by the toilet and in the tub and shower. Do not use towel  bars as grab bars. Use non-skid mats or decals in the tub or shower. If you need to sit down in the shower, use a plastic, non-slip stool. Keep the floor dry. Clean up any water that spills on the floor as soon as it happens. Remove soap buildup in the tub or shower regularly. Attach bath mats securely with double-sided non-slip rug tape. Do not have throw rugs and other things on the floor that can make you trip. What can I do in the bedroom? Use night lights. Make sure  that you have a light by your bed that is easy to reach. Do not use any sheets or blankets that are too big for your bed. They should not hang down onto the floor. Have a firm chair that has side arms. You can use this for support while you get dressed. Do not have throw rugs and other things on the floor that can make you trip. What can I do in the kitchen? Clean up any spills right away. Avoid walking on wet floors. Keep items that you use a lot in easy-to-reach places. If you need to reach something above you, use a strong step stool that has a grab bar. Keep electrical cords out of the way. Do not use floor polish or wax that makes floors slippery. If you must use wax, use non-skid floor wax. Do not have throw rugs and other things on the floor that can make you trip. What can I do with my stairs? Do not leave any items on the stairs. Make sure that there are handrails on both sides of the stairs and use them. Fix handrails that are broken or loose. Make sure that handrails are as long as the stairways. Check any carpeting to make sure that it is firmly attached to the stairs. Fix any carpet that is loose or worn. Avoid having throw rugs at the top or bottom of the stairs. If you do have throw rugs, attach them to the floor with carpet tape. Make sure that you have a light switch at the top of the stairs and the bottom of the stairs. If you do not have them, ask someone to add them for you. What else can I do to help  prevent falls? Wear shoes that: Do not have high heels. Have rubber bottoms. Are comfortable and fit you well. Are closed at the toe. Do not wear sandals. If you use a stepladder: Make sure that it is fully opened. Do not climb a closed stepladder. Make sure that both sides of the stepladder are locked into place. Ask someone to hold it for you, if possible. Clearly mark and make sure that you can see: Any grab bars or handrails. First and last steps. Where the edge of each step is. Use tools that help you move around (mobility aids) if they are needed. These include: Canes. Walkers. Scooters. Crutches. Turn on the lights when you go into a dark area. Replace any light bulbs as soon as they burn out. Set up your furniture so you have a clear path. Avoid moving your furniture around. If any of your floors are uneven, fix them. If there are any pets around you, be aware of where they are. Review your medicines with your doctor. Some medicines can make you feel dizzy. This can increase your chance of falling. Ask your doctor what other things that you can do to help prevent falls. This information is not intended to replace advice given to you by your health care provider. Make sure you discuss any questions you have with your health care provider. Document Released: 11/28/2008 Document Revised: 07/10/2015 Document Reviewed: 03/08/2014 Elsevier Interactive Patient Education  2017 [employer].

## 2021-08-12 DIAGNOSIS — M47815 Spondylosis without myelopathy or radiculopathy, thoracolumbar region: Secondary | ICD-10-CM | POA: Diagnosis not present

## 2021-08-12 DIAGNOSIS — M9901 Segmental and somatic dysfunction of cervical region: Secondary | ICD-10-CM | POA: Diagnosis not present

## 2021-08-12 DIAGNOSIS — M47812 Spondylosis without myelopathy or radiculopathy, cervical region: Secondary | ICD-10-CM | POA: Diagnosis not present

## 2021-08-12 DIAGNOSIS — M9902 Segmental and somatic dysfunction of thoracic region: Secondary | ICD-10-CM | POA: Diagnosis not present

## 2021-09-24 DIAGNOSIS — M47812 Spondylosis without myelopathy or radiculopathy, cervical region: Secondary | ICD-10-CM | POA: Diagnosis not present

## 2021-09-24 DIAGNOSIS — M9901 Segmental and somatic dysfunction of cervical region: Secondary | ICD-10-CM | POA: Diagnosis not present

## 2021-09-24 DIAGNOSIS — M9902 Segmental and somatic dysfunction of thoracic region: Secondary | ICD-10-CM | POA: Diagnosis not present

## 2021-09-24 DIAGNOSIS — M47815 Spondylosis without myelopathy or radiculopathy, thoracolumbar region: Secondary | ICD-10-CM | POA: Diagnosis not present

## 2021-09-29 DIAGNOSIS — M9902 Segmental and somatic dysfunction of thoracic region: Secondary | ICD-10-CM | POA: Diagnosis not present

## 2021-09-29 DIAGNOSIS — M47812 Spondylosis without myelopathy or radiculopathy, cervical region: Secondary | ICD-10-CM | POA: Diagnosis not present

## 2021-09-29 DIAGNOSIS — M9901 Segmental and somatic dysfunction of cervical region: Secondary | ICD-10-CM | POA: Diagnosis not present

## 2021-09-29 DIAGNOSIS — M47815 Spondylosis without myelopathy or radiculopathy, thoracolumbar region: Secondary | ICD-10-CM | POA: Diagnosis not present

## 2021-10-01 DIAGNOSIS — L821 Other seborrheic keratosis: Secondary | ICD-10-CM | POA: Diagnosis not present

## 2021-10-01 DIAGNOSIS — D485 Neoplasm of uncertain behavior of skin: Secondary | ICD-10-CM | POA: Diagnosis not present

## 2021-10-01 DIAGNOSIS — Z85828 Personal history of other malignant neoplasm of skin: Secondary | ICD-10-CM | POA: Diagnosis not present

## 2021-10-01 DIAGNOSIS — L738 Other specified follicular disorders: Secondary | ICD-10-CM | POA: Diagnosis not present

## 2021-10-05 DIAGNOSIS — M9901 Segmental and somatic dysfunction of cervical region: Secondary | ICD-10-CM | POA: Diagnosis not present

## 2021-10-05 DIAGNOSIS — M9902 Segmental and somatic dysfunction of thoracic region: Secondary | ICD-10-CM | POA: Diagnosis not present

## 2021-10-05 DIAGNOSIS — M47812 Spondylosis without myelopathy or radiculopathy, cervical region: Secondary | ICD-10-CM | POA: Diagnosis not present

## 2021-10-05 DIAGNOSIS — M47815 Spondylosis without myelopathy or radiculopathy, thoracolumbar region: Secondary | ICD-10-CM | POA: Diagnosis not present

## 2021-10-14 DIAGNOSIS — M47815 Spondylosis without myelopathy or radiculopathy, thoracolumbar region: Secondary | ICD-10-CM | POA: Diagnosis not present

## 2021-10-14 DIAGNOSIS — M9901 Segmental and somatic dysfunction of cervical region: Secondary | ICD-10-CM | POA: Diagnosis not present

## 2021-10-14 DIAGNOSIS — M47812 Spondylosis without myelopathy or radiculopathy, cervical region: Secondary | ICD-10-CM | POA: Diagnosis not present

## 2021-10-14 DIAGNOSIS — M9902 Segmental and somatic dysfunction of thoracic region: Secondary | ICD-10-CM | POA: Diagnosis not present

## 2021-10-28 DIAGNOSIS — M9901 Segmental and somatic dysfunction of cervical region: Secondary | ICD-10-CM | POA: Diagnosis not present

## 2021-10-28 DIAGNOSIS — M47812 Spondylosis without myelopathy or radiculopathy, cervical region: Secondary | ICD-10-CM | POA: Diagnosis not present

## 2021-10-28 DIAGNOSIS — M9902 Segmental and somatic dysfunction of thoracic region: Secondary | ICD-10-CM | POA: Diagnosis not present

## 2021-10-28 DIAGNOSIS — M47815 Spondylosis without myelopathy or radiculopathy, thoracolumbar region: Secondary | ICD-10-CM | POA: Diagnosis not present

## 2021-11-10 DIAGNOSIS — M47815 Spondylosis without myelopathy or radiculopathy, thoracolumbar region: Secondary | ICD-10-CM | POA: Diagnosis not present

## 2021-11-10 DIAGNOSIS — M9902 Segmental and somatic dysfunction of thoracic region: Secondary | ICD-10-CM | POA: Diagnosis not present

## 2021-11-10 DIAGNOSIS — M9901 Segmental and somatic dysfunction of cervical region: Secondary | ICD-10-CM | POA: Diagnosis not present

## 2021-11-10 DIAGNOSIS — M47812 Spondylosis without myelopathy or radiculopathy, cervical region: Secondary | ICD-10-CM | POA: Diagnosis not present

## 2021-12-08 DIAGNOSIS — M47812 Spondylosis without myelopathy or radiculopathy, cervical region: Secondary | ICD-10-CM | POA: Diagnosis not present

## 2021-12-08 DIAGNOSIS — M47815 Spondylosis without myelopathy or radiculopathy, thoracolumbar region: Secondary | ICD-10-CM | POA: Diagnosis not present

## 2021-12-08 DIAGNOSIS — M9902 Segmental and somatic dysfunction of thoracic region: Secondary | ICD-10-CM | POA: Diagnosis not present

## 2021-12-08 DIAGNOSIS — M9901 Segmental and somatic dysfunction of cervical region: Secondary | ICD-10-CM | POA: Diagnosis not present

## 2021-12-15 DIAGNOSIS — M9901 Segmental and somatic dysfunction of cervical region: Secondary | ICD-10-CM | POA: Diagnosis not present

## 2021-12-15 DIAGNOSIS — M9902 Segmental and somatic dysfunction of thoracic region: Secondary | ICD-10-CM | POA: Diagnosis not present

## 2021-12-15 DIAGNOSIS — M47812 Spondylosis without myelopathy or radiculopathy, cervical region: Secondary | ICD-10-CM | POA: Diagnosis not present

## 2021-12-15 DIAGNOSIS — M47815 Spondylosis without myelopathy or radiculopathy, thoracolumbar region: Secondary | ICD-10-CM | POA: Diagnosis not present

## 2021-12-30 DIAGNOSIS — M47815 Spondylosis without myelopathy or radiculopathy, thoracolumbar region: Secondary | ICD-10-CM | POA: Diagnosis not present

## 2021-12-30 DIAGNOSIS — M47812 Spondylosis without myelopathy or radiculopathy, cervical region: Secondary | ICD-10-CM | POA: Diagnosis not present

## 2021-12-30 DIAGNOSIS — M9901 Segmental and somatic dysfunction of cervical region: Secondary | ICD-10-CM | POA: Diagnosis not present

## 2021-12-30 DIAGNOSIS — M9902 Segmental and somatic dysfunction of thoracic region: Secondary | ICD-10-CM | POA: Diagnosis not present

## 2022-01-13 DIAGNOSIS — M9901 Segmental and somatic dysfunction of cervical region: Secondary | ICD-10-CM | POA: Diagnosis not present

## 2022-01-13 DIAGNOSIS — M47815 Spondylosis without myelopathy or radiculopathy, thoracolumbar region: Secondary | ICD-10-CM | POA: Diagnosis not present

## 2022-01-13 DIAGNOSIS — M9902 Segmental and somatic dysfunction of thoracic region: Secondary | ICD-10-CM | POA: Diagnosis not present

## 2022-01-13 DIAGNOSIS — M47812 Spondylosis without myelopathy or radiculopathy, cervical region: Secondary | ICD-10-CM | POA: Diagnosis not present

## 2022-02-25 ENCOUNTER — Encounter: Payer: Self-pay | Admitting: Internal Medicine

## 2022-03-09 DIAGNOSIS — M47812 Spondylosis without myelopathy or radiculopathy, cervical region: Secondary | ICD-10-CM | POA: Diagnosis not present

## 2022-03-09 DIAGNOSIS — M542 Cervicalgia: Secondary | ICD-10-CM | POA: Diagnosis not present

## 2022-03-13 DIAGNOSIS — M542 Cervicalgia: Secondary | ICD-10-CM | POA: Diagnosis not present

## 2022-03-16 ENCOUNTER — Ambulatory Visit: Payer: PPO | Admitting: Internal Medicine

## 2022-03-16 ENCOUNTER — Encounter: Payer: Self-pay | Admitting: Internal Medicine

## 2022-03-16 VITALS — BP 120/60 | HR 63 | Ht 64.0 in | Wt 130.4 lb

## 2022-03-16 DIAGNOSIS — R1319 Other dysphagia: Secondary | ICD-10-CM

## 2022-03-16 DIAGNOSIS — K219 Gastro-esophageal reflux disease without esophagitis: Secondary | ICD-10-CM | POA: Diagnosis not present

## 2022-03-16 DIAGNOSIS — Z86010 Personal history of colonic polyps: Secondary | ICD-10-CM | POA: Diagnosis not present

## 2022-03-16 MED ORDER — NA SULFATE-K SULFATE-MG SULF 17.5-3.13-1.6 GM/177ML PO SOLN: 1.0000 | Freq: Once | ORAL | 0 refills | Status: AC

## 2022-03-16 NOTE — Patient Instructions (Signed)
_______________________________________________________  If your blood pressure at your visit was 140/90 or greater, please contact your primary care physician to follow up on this.  _______________________________________________________  If you are age 77 or older, your body mass index should be between 23-30. Your Body mass index is 22.38 kg/m. If this is out of the aforementioned range listed, please consider follow up with your Primary Care Provider.  If you are age 53 or younger, your body mass index should be between 19-25. Your Body mass index is 22.38 kg/m. If this is out of the aformentioned range listed, please consider follow up with your Primary Care Provider.   ________________________________________________________  The [place] [facility] providers would like to encourage you to use [facility] to communicate with providers for non-urgent requests or questions.  Due to long hold times on the telephone, sending your provider a message by [facility] may be a faster and more efficient way to get a response.  Please allow 48 business hours for a response.  Please remember that this is for non-urgent requests.  _______________________________________________________  Audrey Tucker have been scheduled for an endoscopy and colonoscopy. Please follow the written instructions given to you at your visit today. Please pick up your prep supplies at the pharmacy within the next 1-3 days. If you use inhalers (even only as needed), please bring them with you on the day of your procedure.

## 2022-03-16 NOTE — Progress Notes (Signed)
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  She presents today with a chief complaint of severe acid reflux over 1 to 2 months duration and some recurrent dysphagia.  Patient reports having had a negative screening colonoscopy elsewhere in 2009.  Colonoscopy here in January 2019 revealed multiple adenomatous colon polyps (5), sigmoid diverticulosis, and hemorrhoids.  Follow-up in 5 years recommended.  In February 2019 upper endoscopy was performed to evaluate dysphagia and noncardiac chest pain.  She was found to have a benign distal esophageal stricture which was dilated.  Testing for Helicobacter pylori was negative.  She was to continue on PPI until prescription expired that stopped to see if she had issues with reflux.  She did not.  However, 2 months ago she noticed severe problems with indigestion and heartburn.  She had been taking Advil at the time.  She also noticed some dysphagia.  She was using over-the-counter antacids and consuming dairy to help.  She made this appointment.  Shortly thereafter she states her symptoms improved.  She is not on regular PPI regular acid suppressive therapies.  Weight is stable.  GI review of systems is otherwise entirely negative.  CT scan September 2021 revealed a left inguinal hernia.  Ultrasound January 2019 was without significant abnormality.  REVIEW OF SYSTEMS:  All non-GI ROS negative unless otherwise stated in the HPI except for arthritis, headaches, muscle cramps, ankle edema  Past Medical History:  Diagnosis Date   Adenomatous colon polyp    tubular   Arthritis    Chicken pox    Diverticulosis    Hernia, femoral    pt stated surgery in august of 2022, on left side    PONV (postoperative nausea and vomiting)    Seasonal allergies     Past Surgical History:  Procedure Laterality Date   EYE  SURGERY Bilateral    cataract   FOOT NEUROMA SURGERY Right 04/29/2015   Dr Rulon Sera HERNIA REPAIR Bilateral 09/25/2020   Procedure: LAPAROSCOPIC Bilateral INGUINAL HERNIA REPAIR WITH MESH;  Surgeon: Kinsinger, Arta Bruce, MD;  Location: [facility];  Service: General;  Laterality: Bilateral;   right hand surgery      SHOULDER ARTHROSCOPY Left     Social History Audrey Tucker  reports that she quit smoking about 54 years ago. Her smoking use included cigarettes. She has never used smokeless tobacco. She reports current alcohol use. She reports that she does not use drugs.  family history includes Arthritis in her father and mother; Cancer in her father; Hypertension in her mother.  No Known Allergies     PHYSICAL EXAMINATION: Vital signs: BP 120/60   Pulse 63   Ht '5\' 4"'$  (1.626 m)   Wt 130 lb 6.4 oz (59.1 kg)   SpO2 94%   BMI 22.38 kg/m   Constitutional: generally well-appearing, no acute distress Psychiatric: alert and oriented x3, cooperative Eyes: extraocular movements intact, anicteric, conjunctiva pink Mouth: oral pharynx moist, no lesions Neck: supple no lymphadenopathy Cardiovascular: heart regular rate and rhythm, no murmur Lungs: clear to auscultation bilaterally Abdomen: soft, nontender, nondistended, no obvious ascites, no peritoneal signs, normal bowel sounds, no organomegaly Rectal: Deferred to colonoscopy Extremities: no clubbing, cyanosis, or lower extremity edema bilaterally Skin: no lesions on visible extremities Neuro: No focal deficits.  Cranial nerves intact  ASSESSMENT:  1.  GERD.  Recently exacerbated.  Now better. 2.  Dysphagia.  History of esophageal stricture requiring dilation Audrey Tucker 56 [place]).  This helped before. 3.  History of multiple adenomatous colon polyps.  Due for surveillance.   PLAN:  1.  Reflux precautions 2.  Upper endoscopy with probable esophageal dilation.The nature of the procedure, as well as the risks, benefits, and  alternatives were carefully and thoroughly reviewed with the patient. Ample time for discussion and questions allowed. The patient understood, was satisfied, and agreed to proceed. 3.  On-demand PPI 4.  Schedule surveillance colonoscopy.The nature of the procedure, as well as the risks, benefits, and alternatives were carefully and thoroughly reviewed with the patient. Ample time for discussion and questions allowed. The patient understood, was satisfied, and agreed to proceed.

## 2022-03-18 DIAGNOSIS — M5032 Other cervical disc degeneration, mid-cervical region, unspecified level: Secondary | ICD-10-CM | POA: Diagnosis not present

## 2022-03-18 DIAGNOSIS — M47812 Spondylosis without myelopathy or radiculopathy, cervical region: Secondary | ICD-10-CM | POA: Diagnosis not present

## 2022-03-25 DIAGNOSIS — Z6822 Body mass index (BMI) 22.0-22.9, adult: Secondary | ICD-10-CM | POA: Diagnosis not present

## 2022-03-25 DIAGNOSIS — F329 Major depressive disorder, single episode, unspecified: Secondary | ICD-10-CM | POA: Diagnosis not present

## 2022-03-25 DIAGNOSIS — Z01419 Encounter for gynecological examination (general) (routine) without abnormal findings: Secondary | ICD-10-CM | POA: Diagnosis not present

## 2022-03-25 DIAGNOSIS — Z1231 Encounter for screening mammogram for malignant neoplasm of breast: Secondary | ICD-10-CM | POA: Diagnosis not present

## 2022-04-12 DIAGNOSIS — M5412 Radiculopathy, cervical region: Secondary | ICD-10-CM | POA: Diagnosis not present

## 2022-04-13 DIAGNOSIS — K5792 Diverticulitis of intestine, part unspecified, without perforation or abscess without bleeding: Secondary | ICD-10-CM | POA: Diagnosis not present

## 2022-04-14 ENCOUNTER — Other Ambulatory Visit (HOSPITAL_COMMUNITY): Payer: Self-pay | Admitting: General Surgery

## 2022-04-14 DIAGNOSIS — K5792 Diverticulitis of intestine, part unspecified, without perforation or abscess without bleeding: Secondary | ICD-10-CM

## 2022-04-15 ENCOUNTER — Ambulatory Visit (HOSPITAL_COMMUNITY)
Admission: RE | Admit: 2022-04-15 | Discharge: 2022-04-15 | Disposition: A | Discharge status: Home / Self Care | Payer: PPO | Source: Ambulatory Visit | Attending: General Surgery | Admitting: General Surgery

## 2022-04-15 ENCOUNTER — Encounter (HOSPITAL_COMMUNITY): Payer: Self-pay

## 2022-04-15 DIAGNOSIS — K5730 Diverticulosis of large intestine without perforation or abscess without bleeding: Secondary | ICD-10-CM | POA: Diagnosis not present

## 2022-04-15 DIAGNOSIS — K5792 Diverticulitis of intestine, part unspecified, without perforation or abscess without bleeding: Secondary | ICD-10-CM | POA: Diagnosis not present

## 2022-04-15 MED ORDER — SODIUM CHLORIDE (PF) 0.9 % IJ SOLN
INTRAMUSCULAR | Status: AC
  Filled 2022-04-15: qty 50

## 2022-04-15 MED ORDER — IOHEXOL 300 MG/ML  SOLN
100.0000 mL | Freq: Once | INTRAMUSCULAR | Status: AC | PRN
  Administered 2022-04-15: 100 mL via INTRAVENOUS

## 2022-04-15 MED ORDER — IOHEXOL 9 MG/ML PO SOLN
500.0000 mL | ORAL | Status: AC
  Administered 2022-04-15 (×2): 500 mL via ORAL

## 2022-04-15 MED ORDER — IOHEXOL 9 MG/ML PO SOLN
ORAL | Status: AC
  Filled 2022-04-15: qty 1000

## 2022-04-29 ENCOUNTER — Ambulatory Visit (AMBULATORY_SURGERY_CENTER): Payer: PPO | Admitting: Internal Medicine

## 2022-04-29 ENCOUNTER — Encounter: Payer: Self-pay | Admitting: Internal Medicine

## 2022-04-29 VITALS — BP 107/55 | HR 65 | Temp 99.3°F | Resp 11 | Ht 64.0 in | Wt 130.0 lb

## 2022-04-29 DIAGNOSIS — R1310 Dysphagia, unspecified: Secondary | ICD-10-CM | POA: Diagnosis not present

## 2022-04-29 DIAGNOSIS — D120 Benign neoplasm of cecum: Secondary | ICD-10-CM | POA: Diagnosis not present

## 2022-04-29 DIAGNOSIS — D124 Benign neoplasm of descending colon: Secondary | ICD-10-CM | POA: Diagnosis not present

## 2022-04-29 DIAGNOSIS — R1319 Other dysphagia: Secondary | ICD-10-CM

## 2022-04-29 DIAGNOSIS — Z09 Encounter for follow-up examination after completed treatment for conditions other than malignant neoplasm: Secondary | ICD-10-CM | POA: Diagnosis not present

## 2022-04-29 DIAGNOSIS — G2581 Restless legs syndrome: Secondary | ICD-10-CM | POA: Diagnosis not present

## 2022-04-29 DIAGNOSIS — K219 Gastro-esophageal reflux disease without esophagitis: Secondary | ICD-10-CM

## 2022-04-29 DIAGNOSIS — K222 Esophageal obstruction: Secondary | ICD-10-CM

## 2022-04-29 DIAGNOSIS — Z86010 Personal history of colonic polyps: Secondary | ICD-10-CM

## 2022-04-29 DIAGNOSIS — R197 Diarrhea, unspecified: Secondary | ICD-10-CM

## 2022-04-29 MED ORDER — SODIUM CHLORIDE 0.9 % IV SOLN: 500.0000 mL | Freq: Once | INTRAVENOUS | Status: DC

## 2022-04-29 NOTE — Progress Notes (Signed)
Pt resting comfortably. VSS. Airway intact. SBAR complete to RN. All questions answered.   

## 2022-04-29 NOTE — Op Note (Signed)
[place] Patient Name: Audrey Tucker Procedure Date: 04/29/2022 12:04 PM MRN: [id number] Endoscopist: Docia Chuck. Henrene Pastor , MD, [id number] Age: 77 Referring MD:  Date of Birth: [birth year] Gender: Female Account #: [id number] Procedure:                Colonoscopy with cold snare polypectomy x 2; with                            biopsies Indications:              High risk colon cancer surveillance: Personal                            history of multiple (3 or more) adenomas. Previous                            examinations 2009 (elsewhere, negative); 2019.                            Patient also reports interval change in bowel                            habits with loose stools since her office visit Medicines:                Monitored Anesthesia Care Procedure:                Pre-Anesthesia Assessment:                           - Prior to the procedure, a History and Physical                            was performed, and patient medications and                            allergies were reviewed. The patient's tolerance of                            previous anesthesia was also reviewed. The risks                            and benefits of the procedure and the sedation                            options and risks were discussed with the patient.                            All questions were answered, and informed consent                            was obtained. Prior Anticoagulants: The patient has                            taken no anticoagulant or antiplatelet agents. ASA  Grade Assessment: II - A patient with mild systemic                            disease. After reviewing the risks and benefits,                            the patient was deemed in satisfactory condition to                            undergo the procedure.                           After obtaining informed consent, the colonoscope                            was passed under direct  vision. Throughout the                            procedure, the patient's blood pressure, pulse, and                            oxygen saturations were monitored continuously. The                            CF [id number] was introduced through the anus                            and advanced to the the cecum, identified by                            appendiceal orifice and ileocecal valve. The                            ileocecal valve, appendiceal orifice, and rectum                            were photographed. The quality of the bowel                            preparation was excellent. The colonoscopy was                            performed without difficulty. The patient tolerated                            the procedure well. The bowel preparation used was                            SUPREP via split dose instruction. Scope In: 12:10:53 PM Scope Out: 12:23:27 PM Scope Withdrawal Time: 0 hours 9 minutes 39 seconds  Total Procedure Duration: 0 hours 12 minutes 34 seconds  Findings:                 Two polyps were found in the descending colon and  cecum. The polyps were 2 to 4 mm in size. These                            polyps were removed with a cold snare. Resection                            and retrieval were complete.                           Multiple diverticula were found in the sigmoid                            colon.                           The exam was otherwise without abnormality on                            direct and retroflexion views.                           Biopsies for histology were taken with a cold                            forceps from the entire colon for evaluation of                            microscopic colitis. Complications:            No immediate complications. Estimated blood loss:                            None. Estimated Blood Loss:     Estimated blood loss: none. Recommendation:           - Repeat  colonoscopy is not recommended for                            surveillance.                           - Patient has a contact number available for                            emergencies. The signs and symptoms of potential                            delayed complications were discussed with the                            patient. Return to normal activities tomorrow.                            Written discharge instructions were provided to the                            patient.                           -  Resume previous diet.                           - Continue present medications.                           - Await pathology results.                           - Recommend bulking agent to help with irregular                            bowels and improve stool consistency. Please take                            Citrucel 2 tablespoons daily and 12 to 14 ounces of                            water or juice Layne Lebon N. Henrene Pastor, MD 04/29/2022 12:30:26 PM This report has been signed electronically.

## 2022-04-29 NOTE — Patient Instructions (Signed)
Handouts provided on polyps, diverticulosis and post dilation diet.   Continue present medications.  Await pathology results.  Recommend bulking agent to help with irregular bowels and improve stool consistency. Please take Citrucel 2 tablespoons daily in 12-14 ounces of water or juice.   YOU HAD AN ENDOSCOPIC PROCEDURE TODAY AT [facility]:   Refer to the procedure report that was given to you for any specific questions about what was found during the examination.  If the procedure report does not answer your questions, please call your gastroenterologist to clarify.  If you requested that your care partner not be given the details of your procedure findings, then the procedure report has been included in a sealed envelope for you to review at your convenience later.  YOU SHOULD EXPECT: Some feelings of bloating in the abdomen. Passage of more gas than usual.  Walking can help get rid of the air that was put into your GI tract during the procedure and reduce the bloating. If you had a lower endoscopy (such as a colonoscopy or flexible sigmoidoscopy) you may notice spotting of blood in your stool or on the toilet paper. If you underwent a bowel prep for your procedure, you may not have a normal bowel movement for a few days.  Please Note:  You might notice some irritation and congestion in your nose or some drainage.  This is from the oxygen used during your procedure.  There is no need for concern and it should clear up in a day or so.  SYMPTOMS TO REPORT IMMEDIATELY:  Following lower endoscopy (colonoscopy or flexible sigmoidoscopy):  Excessive amounts of blood in the stool  Significant tenderness or worsening of abdominal pains  Swelling of the abdomen that is new, acute  Fever of 100F or higher  Following upper endoscopy (EGD)  Vomiting of blood or coffee ground material  New chest pain or pain under the shoulder blades  Painful or persistently difficult swallowing  New  shortness of breath  Fever of 100F or higher  Black, tarry-looking stools  For urgent or emergent issues, a gastroenterologist can be reached at any hour by calling [phone]. Do not use MyChart messaging for urgent concerns.    DIET:  Post dilation diet: Clear liquids for 1 hour (until 1:40pm). Then a Soft diet (see handout) for the rest of today. You may resume your regular diet tomorrow as tolerated. Drink plenty of fluids but you should avoid alcoholic beverages for 24 hours.  ACTIVITY:  You should plan to take it easy for the rest of today and you should NOT DRIVE or use heavy machinery until tomorrow (because of the sedation medicines used during the test).    FOLLOW UP: Our staff will call the number listed on your records the next business day following your procedure.  We will call around 7:15- 8:00 am to check on you and address any questions or concerns that you may have regarding the information given to you following your procedure. If we do not reach you, we will leave a message.     If any biopsies were taken you will be contacted by phone or by letter within the next 1-3 weeks.  Please call us at [phone] if you have not heard about the biopsies in 3 weeks.    SIGNATURES/CONFIDENTIALITY: You and/or your care partner have signed paperwork which will be entered into your electronic medical record.  These signatures attest to the fact that that the information above  on your After Visit Summary has been reviewed and is understood.  Full responsibility of the confidentiality of this discharge information lies with you and/or your care-partner.

## 2022-04-29 NOTE — Op Note (Signed)
[place] Patient Name: Audrey Tucker Procedure Date: 04/29/2022 12:04 PM MRN: [id number] Endoscopist: Docia Chuck. Henrene Pastor , MD, [id number] Age: 77 Referring MD:  Date of Birth: [birth year] Gender: Female Account #: [id number] Procedure:                Upper GI endoscopy with [facility] dilation of the                            esophagus. 48 French Indications:              Dysphagia, Esophageal reflux Medicines:                Monitored Anesthesia Care Procedure:                Pre-Anesthesia Assessment:                           - Prior to the procedure, a History and Physical                            was performed, and patient medications and                            allergies were reviewed. The patient's tolerance of                            previous anesthesia was also reviewed. The risks                            and benefits of the procedure and the sedation                            options and risks were discussed with the patient.                            All questions were answered, and informed consent                            was obtained. Prior Anticoagulants: The patient has                            taken no anticoagulant or antiplatelet agents. ASA                            Grade Assessment: II - A patient with mild systemic                            disease. After reviewing the risks and benefits,                            the patient was deemed in satisfactory condition to                            undergo the procedure.  After obtaining informed consent, the endoscope was                            passed under direct vision. Throughout the                            procedure, the patient's blood pressure, pulse, and                            oxygen saturations were monitored continuously. The                            GIF [id number] was introduced through the                            mouth, and advanced to the  second part of duodenum.                            The upper GI endoscopy was accomplished without                            difficulty. The patient tolerated the procedure                            well. Scope In: Scope Out: Findings:                 One benign-appearing, intrinsic mild stenosis was                            found 39 cm from the incisors. This stenosis                            measured 1.5 cm (inner diameter). The scope was                            withdrawn. Dilation was performed with a Maloney                            dilator with no resistance at 32 Fr.                           The exam of the esophagus was otherwise normal.                           The stomach was normal.                           The examined duodenum was normal.                           The cardia and gastric fundus were normal on                            retroflexion. Complications:  No immediate complications. Estimated Blood Loss:     Estimated blood loss: none. Impression:               - Benign-appearing esophageal stenosis. Dilated.                           - Normal stomach.                           - Normal examined duodenum.                           - No specimens collected. Recommendation:           1. Patient has a contact number available for                            emergencies. The signs and symptoms of potential                            delayed complications were discussed with the                            patient. Return to normal activities tomorrow.                            Written discharge instructions were provided to the                            patient.                           2. Post dilation diet.                           3. Continue present medications. Docia Chuck. Henrene Pastor, MD 04/29/2022 12:41:03 PM This report has been signed electronically.

## 2022-04-29 NOTE — Progress Notes (Signed)
Called to room to assist during endoscopic procedure.  Patient ID and intended procedure confirmed with present staff. Received instructions for my participation in the procedure from the performing physician.  

## 2022-04-29 NOTE — Progress Notes (Signed)
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  She presents today with a chief complaint of severe acid reflux over 1 to 2 months duration and some recurrent dysphagia.  Patient reports having had a negative screening colonoscopy elsewhere in 2009.  Colonoscopy here in January 2019 revealed multiple adenomatous colon polyps (5), sigmoid diverticulosis, and hemorrhoids.  Follow-up in 5 years recommended.  In February 2019 upper endoscopy was performed to evaluate dysphagia and noncardiac chest pain.  She was found to have a benign distal esophageal stricture which was dilated.  Testing for Helicobacter pylori was negative.  She was to continue on PPI until prescription expired that stopped to see if she had issues with reflux.  She did not.  However, 2 months ago she noticed severe problems with indigestion and heartburn.  She had been taking Advil at the time.  She also noticed some dysphagia.  She was using over-the-counter antacids and consuming dairy to help.  She made this appointment.  Shortly thereafter she states her symptoms improved.  She is not on regular PPI regular acid suppressive therapies.  Weight is stable.  GI review of systems is otherwise entirely negative.  CT scan September 2021 revealed a left inguinal hernia.  Ultrasound January 2019 was without significant abnormality.  REVIEW OF SYSTEMS:  All non-GI ROS negative unless otherwise stated in the HPI except for arthritis, headaches, muscle cramps, ankle edema  Past Medical History:  Diagnosis Date   Adenomatous colon polyp    tubular   Arthritis    Chicken pox    Diverticulosis    Hernia, femoral    pt stated surgery in august of 2022, on left side    PONV (postoperative nausea and vomiting)    Seasonal allergies     Past Surgical History:  Procedure Laterality Date   EYE  SURGERY Bilateral    cataract   FOOT NEUROMA SURGERY Right 04/29/2015   Dr Regal   INGUINAL HERNIA REPAIR Bilateral 09/25/2020   Procedure: LAPAROSCOPIC Bilateral INGUINAL HERNIA REPAIR WITH MESH;  Surgeon: Kinsinger, Luke Aaron, MD;  Location: [facility];  Service: General;  Laterality: Bilateral;   right hand surgery      SHOULDER ARTHROSCOPY Left     Social History Audrey Tucker  reports that she quit smoking about 54 years ago. Her smoking use included cigarettes. She has never used smokeless tobacco. She reports current alcohol use. She reports that she does not use drugs.  family history includes Arthritis in her father and mother; Cancer in her father; Hypertension in her mother.  No Known Allergies     PHYSICAL EXAMINATION: Vital signs: BP 120/60   Pulse 63   Ht 5' 4" (1.626 m)   Wt 130 lb 6.4 oz (59.1 kg)   SpO2 94%   BMI 22.38 kg/m   Constitutional: generally well-appearing, no acute distress Psychiatric: alert and oriented x3, cooperative Eyes: extraocular movements intact, anicteric, conjunctiva pink Mouth: oral pharynx moist, no lesions Neck: supple no lymphadenopathy Cardiovascular: heart regular rate and rhythm, no murmur Lungs: clear to auscultation bilaterally Abdomen: soft, nontender, nondistended, no obvious ascites, no peritoneal signs, normal bowel sounds, no organomegaly Rectal: Deferred to colonoscopy Extremities: no clubbing, cyanosis, or lower extremity edema bilaterally Skin: no lesions on visible extremities Neuro: No focal deficits.  Cranial nerves intact  ASSESSMENT:  1.  GERD.  Recently exacerbated.    Now better. 2.  Dysphagia.  History of esophageal stricture requiring dilation (Maloney 54 French).  This helped before. 3.  History of multiple adenomatous colon polyps.  Due for surveillance.   PLAN:  1.  Reflux precautions 2.  Upper endoscopy with probable esophageal dilation.The nature of the procedure, as well as the risks, benefits, and  alternatives were carefully and thoroughly reviewed with the patient. Ample time for discussion and questions allowed. The patient understood, was satisfied, and agreed to proceed. 3.  On-demand PPI 4.  Schedule surveillance colonoscopy.The nature of the procedure, as well as the risks, benefits, and alternatives were carefully and thoroughly reviewed with the patient. Ample time for discussion and questions allowed. The patient understood, was satisfied, and agreed to proceed.      

## 2022-04-30 ENCOUNTER — Telehealth: Payer: Self-pay | Admitting: *Deleted

## 2022-04-30 NOTE — Telephone Encounter (Signed)
Post procedure follow up call placed, no answer and left VM.  

## 2022-05-11 ENCOUNTER — Encounter: Payer: Self-pay | Admitting: Internal Medicine

## 2022-05-11 DIAGNOSIS — M47812 Spondylosis without myelopathy or radiculopathy, cervical region: Secondary | ICD-10-CM | POA: Diagnosis not present

## 2022-05-11 DIAGNOSIS — M5032 Other cervical disc degeneration, mid-cervical region, unspecified level: Secondary | ICD-10-CM | POA: Diagnosis not present

## 2022-05-12 DIAGNOSIS — Z8719 Personal history of other diseases of the digestive system: Secondary | ICD-10-CM | POA: Diagnosis not present

## 2022-05-12 DIAGNOSIS — Z9889 Other specified postprocedural states: Secondary | ICD-10-CM | POA: Diagnosis not present

## 2022-05-12 DIAGNOSIS — K5792 Diverticulitis of intestine, part unspecified, without perforation or abscess without bleeding: Secondary | ICD-10-CM | POA: Diagnosis not present

## 2022-05-20 DIAGNOSIS — M542 Cervicalgia: Secondary | ICD-10-CM | POA: Diagnosis not present

## 2022-05-26 DIAGNOSIS — M542 Cervicalgia: Secondary | ICD-10-CM | POA: Diagnosis not present

## 2022-05-28 DIAGNOSIS — M542 Cervicalgia: Secondary | ICD-10-CM | POA: Diagnosis not present

## 2022-06-01 DIAGNOSIS — M542 Cervicalgia: Secondary | ICD-10-CM | POA: Diagnosis not present

## 2022-06-03 DIAGNOSIS — M542 Cervicalgia: Secondary | ICD-10-CM | POA: Diagnosis not present

## 2022-06-07 DIAGNOSIS — M542 Cervicalgia: Secondary | ICD-10-CM | POA: Diagnosis not present

## 2022-06-10 DIAGNOSIS — M542 Cervicalgia: Secondary | ICD-10-CM | POA: Diagnosis not present

## 2022-06-15 NOTE — Progress Notes (Unsigned)
ACUTE VISIT No chief complaint on file.  HPI: [name] is a 77 y.o. female, who is here today complaining of *** HPI  Review of Systems See other pertinent positives and negatives in HPI.  Current Outpatient Medications on File Prior to Visit  Medication Sig Dispense Refill   PARoxetine (PAXIL) 10 MG tablet Take 10 mg by mouth daily.     tiZANidine (ZANAFLEX) 2 MG tablet Take 2 mg by mouth every 6 (six) hours as needed for muscle spasms.     No current facility-administered medications on file prior to visit.    Past Medical History:  Diagnosis Date   Adenomatous colon polyp    tubular   Arthritis    Chicken pox    Diverticulosis    Hernia, femoral    pt stated surgery in august of 2022, on left side    PONV (postoperative nausea and vomiting)    Seasonal allergies    No Known Allergies  Social History   Socioeconomic History   Marital status: Married    Spouse name: Not on file   Number of children: 0   Years of education: 16   Highest education level: Associate degree: academic program  Occupational History   Occupation: works occasionally     Comment: retired   Tobacco Use   Smoking status: Former    Types: Cigarettes    Quit date: 04/05/1967    Years since quitting: 55.2   Smokeless tobacco: Never   Tobacco comments:    rare use in college   Vaping Use   Vaping Use: Never used  Substance and Sexual Activity   Alcohol use: Yes    Comment: social   Drug use: No   Sexual activity: Not on file  Other Topics Concern   Not on file  Social History Narrative   Married   No children   HH 2   Yard work, Programmer, applications   Works occasionally    Social Determinants of Health   Financial Resource Strain: Low Risk  (06/14/2022)   Overall Financial Resource Strain (CARDIA)    Difficulty of Paying Living Expenses: Not hard at all  Food Insecurity: No Food Insecurity (06/14/2022)   Hunger Vital Sign    Worried About Running Out of Food in the Last  Year: Never true    Ran Out of Food in the Last Year: Never true  Transportation Needs: No Transportation Needs (06/14/2022)   PRAPARE - Administrator, Civil Service (Medical): No    Lack of Transportation (Non-Medical): No  Physical Activity: Insufficiently Active (06/14/2022)   Exercise Vital Sign    Days of Exercise per Week: 2 days    Minutes of Exercise per Session: 50 min  Stress: No Stress Concern Present (06/14/2022)   [employer] of Occupational Health - Occupational Stress Questionnaire    Feeling of Stress : Only a little  Social Connections: Socially Integrated (06/14/2022)   Social Connection and Isolation Panel [NHANES]    Frequency of Communication with Friends and Family: More than three times a week    Frequency of Social Gatherings with Friends and Family: Three times a week    Attends Religious Services: More than 4 times per year    Active Member of Clubs or Organizations: Yes    Attends Banker Meetings: More than 4 times per year    Marital Status: Married    There were no vitals filed for this visit. There is  no height or weight on file to calculate BMI.  Physical Exam  ASSESSMENT AND PLAN: There are no diagnoses linked to this encounter.  No follow-ups on file.  Francesa Eugenio G. [place], MD  [facility]. [place] office.  Discharge Instructions   None

## 2022-06-16 ENCOUNTER — Ambulatory Visit (INDEPENDENT_AMBULATORY_CARE_PROVIDER_SITE_OTHER): Payer: PPO | Admitting: Family Medicine

## 2022-06-16 ENCOUNTER — Encounter: Payer: Self-pay | Admitting: Family Medicine

## 2022-06-16 VITALS — BP 120/72 | HR 76 | Resp 16 | Ht 64.0 in | Wt 128.4 lb

## 2022-06-16 DIAGNOSIS — J984 Other disorders of lung: Secondary | ICD-10-CM | POA: Insufficient documentation

## 2022-06-16 DIAGNOSIS — G2581 Restless legs syndrome: Secondary | ICD-10-CM

## 2022-06-16 DIAGNOSIS — I700 Atherosclerosis of aorta: Secondary | ICD-10-CM | POA: Diagnosis not present

## 2022-06-16 MED ORDER — ROPINIROLE HCL 0.5 MG PO TABS: 0.5000 mg | ORAL_TABLET | Freq: Every evening | ORAL | 1 refills | Status: DC | PRN

## 2022-06-16 NOTE — Patient Instructions (Signed)
A few things to remember from today's visit:  Apical lung scarring  Abdominal aortic atherosclerosis (HCC), Chronic - Plan: Lipid panel  RLS (restless legs syndrome) - Plan: rOPINIRole (REQUIP) 0.5 MG tablet  I do not think further lung testing is needed at this time given the fact you are asymptomatic. Monitor for new symptoms. Fasting labs next week. Try Requip at bedtime as needed, 1-2 tabs, if it doe snot help, I can sent Alprazolam.  If you need refills for medications you take chronically, please call your pharmacy. Do not use My Chart to request refills or for acute issues that need immediate attention. If you send a my chart message, it may take a few days to be addressed, specially if I am not in the office.  Please be sure medication list is accurate. If a new problem present, please set up appointment sooner than planned today.

## 2022-06-17 NOTE — Assessment & Plan Note (Signed)
We discussed abdominal CT's findings, prognosis,and goals of treatment. She prefers to hold on statins for now. Will arrange lab appt for FLP.

## 2022-06-17 NOTE — Assessment & Plan Note (Signed)
We discussed Dx and treatment options. Instead resuming Lorazepam, she agrees with trying Requip 0.5 mg 1-2 tabs daily at night as needed. She will let me know if medication does not help.

## 2022-06-17 NOTE — Assessment & Plan Note (Signed)
Incidental finding on Cervical MRI done on 03/13/2022:Linear scarring in the visible right lung apex. We discussed possible etiologies, hx and examination do not suggest a serious process. I do not think further work up is needed at this time. She will monitor for worsening or persistent cough or new respiratory symptom. She voices understanding and agrees with plan.

## 2022-06-18 ENCOUNTER — Other Ambulatory Visit (INDEPENDENT_AMBULATORY_CARE_PROVIDER_SITE_OTHER): Payer: PPO

## 2022-06-18 DIAGNOSIS — I700 Atherosclerosis of aorta: Secondary | ICD-10-CM | POA: Diagnosis not present

## 2022-06-18 DIAGNOSIS — M542 Cervicalgia: Secondary | ICD-10-CM | POA: Diagnosis not present

## 2022-06-18 LAB — LIPID PANEL
Cholesterol: 199 mg/dL (ref 0–200)
HDL: 81.1 mg/dL (ref >39.00)
LDL Cholesterol: 104 mg/dL — ABNORMAL HIGH (ref 0–99)
NonHDL: 117.42
Total CHOL/HDL Ratio: 2
Triglycerides: 66 mg/dL (ref 0.0–149.0)
VLDL: 13.2 mg/dL (ref 0.0–40.0)

## 2022-06-22 DIAGNOSIS — M542 Cervicalgia: Secondary | ICD-10-CM | POA: Diagnosis not present

## 2022-06-24 DIAGNOSIS — M542 Cervicalgia: Secondary | ICD-10-CM | POA: Diagnosis not present

## 2022-06-28 DIAGNOSIS — M542 Cervicalgia: Secondary | ICD-10-CM | POA: Diagnosis not present

## 2022-07-06 DIAGNOSIS — M47812 Spondylosis without myelopathy or radiculopathy, cervical region: Secondary | ICD-10-CM | POA: Diagnosis not present

## 2022-07-22 DIAGNOSIS — M47812 Spondylosis without myelopathy or radiculopathy, cervical region: Secondary | ICD-10-CM | POA: Diagnosis not present

## 2022-08-10 DIAGNOSIS — M47812 Spondylosis without myelopathy or radiculopathy, cervical region: Secondary | ICD-10-CM | POA: Diagnosis not present

## 2022-08-11 DIAGNOSIS — M47812 Spondylosis without myelopathy or radiculopathy, cervical region: Secondary | ICD-10-CM | POA: Diagnosis not present

## 2022-08-11 DIAGNOSIS — M5032 Other cervical disc degeneration, mid-cervical region, unspecified level: Secondary | ICD-10-CM | POA: Diagnosis not present

## 2022-08-11 DIAGNOSIS — M542 Cervicalgia: Secondary | ICD-10-CM | POA: Diagnosis not present

## 2022-08-12 ENCOUNTER — Encounter (INDEPENDENT_AMBULATORY_CARE_PROVIDER_SITE_OTHER): Payer: PPO | Admitting: Family Medicine

## 2022-08-12 DIAGNOSIS — M542 Cervicalgia: Secondary | ICD-10-CM | POA: Diagnosis not present

## 2022-08-12 DIAGNOSIS — M5032 Other cervical disc degeneration, mid-cervical region, unspecified level: Secondary | ICD-10-CM | POA: Diagnosis not present

## 2022-08-12 DIAGNOSIS — M47812 Spondylosis without myelopathy or radiculopathy, cervical region: Secondary | ICD-10-CM | POA: Diagnosis not present

## 2022-08-12 NOTE — Progress Notes (Signed)
Per nursing notes pt rescheduled to another date earlier as was going out of town

## 2022-08-13 DIAGNOSIS — M542 Cervicalgia: Secondary | ICD-10-CM | POA: Diagnosis not present

## 2022-08-13 DIAGNOSIS — M47812 Spondylosis without myelopathy or radiculopathy, cervical region: Secondary | ICD-10-CM | POA: Diagnosis not present

## 2022-08-13 DIAGNOSIS — M5032 Other cervical disc degeneration, mid-cervical region, unspecified level: Secondary | ICD-10-CM | POA: Diagnosis not present

## 2022-08-26 ENCOUNTER — Telehealth (INDEPENDENT_AMBULATORY_CARE_PROVIDER_SITE_OTHER): Payer: PPO | Admitting: Family Medicine

## 2022-08-26 DIAGNOSIS — Z0000 Encounter for general adult medical examination without abnormal findings: Secondary | ICD-10-CM | POA: Diagnosis not present

## 2022-08-26 NOTE — Progress Notes (Signed)
PATIENT CHECK-IN and HEALTH RISK ASSESSMENT QUESTIONNAIRE:  -completed by phone/video for upcoming Medicare Preventive Visit  Pre-Visit Check-in: 1)Vitals (height, wt, BP, etc) - record in vitals section for visit on day of visit 2)Review and Update Medications, Allergies PMH, Surgeries, Social history in Epic 3)Hospitalizations in the last year with date/reason?  none  4)Review and Update Care Team (patient's specialists) in Epic 5) Complete PHQ9 in Epic  6) Complete Fall Screening in Epic 7)Review all Health Maintenance Due and order under PCP if not done.  8)Medicare Wellness Questionnaire: Answer theses question about your habits: Do you drink alcohol? Wine, 2 drinks at most Have you ever smoked? Very remotely in college How many packs a day do/did you smoke? none Do you use smokeless tobacco?no Do you use an illicit drugs? none Do you exercises? No, but is very active - never sits down, up and down the steps - takes two stairs at time, works in the yard Are you sexually active? no Reports eats healthy, lots of veggies, beans, salads  Beverages: 2 cups of coffee, water, no sodas  Answer theses question about you: Can you perform most household chores? yes Do you find it hard to follow a conversation in a noisy room? Some trouble - has seen Dr. Lovey Newcomer for this, considering hearing aids, also has seen audiology Do you often ask people to speak up or repeat themselves? Some trouble Do you feel that you have a problem with memory? No Do you balance your checkbook and or bank acounts? yes Do you feel safe at home? yes Last dentist visit?twice per year Do you need assistance with any of the following: Please note if so  - none needed  Driving?  Feeding yourself?  Getting from bed to chair?  Getting to the toilet?  Bathing or showering?  Dressing yourself?  Managing money?  Climbing a flight of stairs  Preparing meals?  Do you have Advanced Directives in place (Living Will,  Healthcare Power or Attorney)? Yes   Last eye Exam and location? Dr. Lorin Picket, goes at least yearly   Do you currently use prescribed or non-prescribed narcotic or opioid pain medications? none  Do you have a history or close family history of breast, ovarian, tubal or peritoneal cancer or a family member with BRCA (breast cancer susceptibility 1 and 2) gene mutations? None that she is aware of  Nurse/Assistant Credentials/time stamp:   ----------------------------------------------------------------------------------------------------------------------------------------------------------------------------------------------------------------------   MEDICARE ANNUAL PREVENTIVE VISIT WITH PROVIDER: (Welcome to [employer], initial annual wellness or annual wellness exam)  Virtual Visit via Video Note  I connected with Audrey Tucker on 08/26/22 by a video enabled telemedicine application and verified that I am speaking with the correct person using two identifiers.  Location patient: home Location provider:work or home office Persons participating in the virtual visit: patient, provider  Concerns and/or follow up today: stable   See HM section in Epic for other details of completed HM.    ROS: negative for report of fevers, unintentional weight loss, vision changes, vision loss, hearing loss or change, chest pain, sob, hemoptysis, melena, hematochezia, hematuria, falls, bleeding or bruising, thoughts of suicide or self harm, memory loss  Patient-completed extensive health risk assessment - reviewed and discussed with the patient: See Health Risk Assessment completed with patient prior to the visit either above or in recent phone note. This was reviewed in detailed with the patient today and appropriate recommendations, orders and referrals were placed as needed per Summary below and patient instructions.  Review of Medical History: -PMH, PSH, Family History and current specialty and  care providers reviewed and updated and listed below   Patient Care Team: [place], Betty G, MD as PCP - General (Family Medicine)   Past Medical History:  Diagnosis Date   Adenomatous colon polyp    tubular   Arthritis    Chicken pox    Diverticulosis    Hernia, femoral    pt stated surgery in august of 2022, on left side    PONV (postoperative nausea and vomiting)    Seasonal allergies     Past Surgical History:  Procedure Laterality Date   EYE SURGERY Bilateral    cataract   FOOT NEUROMA SURGERY Right 04/29/2015   Dr Lucienne Minks HERNIA REPAIR Bilateral 09/25/2020   Procedure: LAPAROSCOPIC Bilateral INGUINAL HERNIA REPAIR WITH MESH;  Surgeon: Kinsinger, De Blanch, MD;  Location: [facility];  Service: General;  Laterality: Bilateral;   right hand surgery      SHOULDER ARTHROSCOPY Left     Social History   Socioeconomic History   Marital status: Married    Spouse name: Not on file   Number of children: 0   Years of education: 16   Highest education level: Associate degree: academic program  Occupational History   Occupation: works occasionally     Comment: retired   Tobacco Use   Smoking status: Former    Current packs/day: 0.00    Types: Cigarettes    Quit date: 04/05/1967    Years since quitting: 55.4   Smokeless tobacco: Never   Tobacco comments:    rare use in college   Vaping Use   Vaping status: Never Used  Substance and Sexual Activity   Alcohol use: Yes    Comment: social   Drug use: No   Sexual activity: Not on file  Other Topics Concern   Not on file  Social History Narrative   Married   No children   HH 2   Yard work, Programmer, applications   Works occasionally    Social Determinants of Health   Financial Resource Strain: Low Risk  (06/14/2022)   Overall Financial Resource Strain (CARDIA)    Difficulty of Paying Living Expenses: Not hard at all  Food Insecurity: No Food Insecurity (06/14/2022)   Hunger Vital Sign    Worried About Running Out of  Food in the Last Year: Never true    Ran Out of Food in the Last Year: Never true  Transportation Needs: No Transportation Needs (06/14/2022)   PRAPARE - Administrator, Civil Service (Medical): No    Lack of Transportation (Non-Medical): No  Physical Activity: Insufficiently Active (06/14/2022)   Exercise Vital Sign    Days of Exercise per Week: 2 days    Minutes of Exercise per Session: 50 min  Stress: No Stress Concern Present (06/14/2022)   [employer] of Occupational Health - Occupational Stress Questionnaire    Feeling of Stress : Only a little  Social Connections: Socially Integrated (06/14/2022)   Social Connection and Isolation Panel [NHANES]    Frequency of Communication with Friends and Family: More than three times a week    Frequency of Social Gatherings with Friends and Family: Three times a week    Attends Religious Services: More than 4 times per year    Active Member of Clubs or Organizations: Yes    Attends Banker Meetings: More than 4 times per year    Marital Status:  Married  Intimate Partner Violence: Not At Risk (08/05/2021)   Humiliation, Afraid, Rape, and Kick questionnaire    Fear of Current or Ex-Partner: No    Emotionally Abused: No    Physically Abused: No    Sexually Abused: No    Family History  Problem Relation Age of Onset   Arthritis Mother    Hypertension Mother    Arthritis Father    Cancer Father        lung    Current Outpatient Medications on File Prior to Visit  Medication Sig Dispense Refill   PARoxetine (PAXIL) 10 MG tablet Take 10 mg by mouth daily.     rOPINIRole (REQUIP) 0.5 MG tablet Take 1-2 tablets (0.5-1 mg total) by mouth at bedtime as needed. 30 tablet 1   tiZANidine (ZANAFLEX) 2 MG tablet Take 2 mg by mouth every 6 (six) hours as needed for muscle spasms.     No current facility-administered medications on file prior to visit.    No Known Allergies     Physical Exam There were no vitals  filed for this visit. Estimated body mass index is 22.04 kg/m as calculated from the following:   Height as of 06/16/22: 5\' 4"  (1.626 m).   Weight as of 06/16/22: 128 lb 6 oz (58.2 kg).  EKG (optional): deferred due to virtual visit  GENERAL: alert, oriented, no acute distress detected, full vision exam deferred due to pandemic and/or virtual encounter   HEENT: atraumatic, conjunttiva clear, no obvious abnormalities on inspection of external nose and ears  NECK: normal movements of the head and neck  LUNGS: on inspection no signs of respiratory distress, breathing rate appears normal, no obvious gross SOB, gasping or wheezing  CV: no obvious cyanosis  MS: moves all visible extremities without noticeable abnormality  PSYCH/NEURO: pleasant and cooperative, no obvious depression or anxiety, speech and thought processing grossly intact, Cognitive function grossly intact        08/26/2022   10:45 AM 06/16/2022    2:19 PM 08/05/2021    1:58 PM 07/23/2020    8:48 AM 05/16/2020   10:21 AM  Depression screen PHQ 2/9  Decreased Interest 0 0 0 0 0  Down, Depressed, Hopeless 0 0 0 0 0  PHQ - 2 Score 0 0 0 0 0       06/14/2022    2:14 PM 06/16/2022    2:19 PM 08/03/2022    1:24 PM 08/08/2022   11:36 AM 08/26/2022   10:44 AM  Fall Risk  Falls in the past year? 0 0 0 0 0  Was there an injury with Fall?  0 0 0 0  Fall Risk Category Calculator  0 0   0 0 0  Patient at Risk for Falls Due to  Other (Comment)   No Fall Risks  Fall risk Follow up  Falls evaluation completed        SUMMARY AND PLAN:  Encounter for Medicare annual wellness exam    Discussed applicable health maintenance/preventive health measures and advised and referred or ordered per patient preferences: -reports had bone density test with Dr. Langston Masker in the last 1 year -discussed vaccines due and recommendation and where could get Health Maintenance  Topic Date Due   Zoster Vaccines- Shingrix (1 of 2) Never done    Pneumonia Vaccine 33+ Years old (2 of 2 - PCV) 02/27/2020   COVID-19 Vaccine (1 - 2023-24 season) Never done   INFLUENZA VACCINE  12/15/2028 (Originally 09/16/2022)  Medicare Annual Wellness (AWV)  08/26/2023   DTaP/Tdap/Td (2 - Td or Tdap) 02/16/2024   Colonoscopy  04/29/2027   DEXA SCAN  Completed   Hepatitis C Screening  Completed   HPV VACCINES  Aged [employer] and counseling on the following was provided based on the above review of health and a plan/checklist for the patient, along with additional information discussed, was provided for the patient in the patient instructions :  -Provided counseling and plan for difficulty hearing, she is considering getting back in to see ENT for consideration of hearing aids -Provided balance exercises in patient instructions that can be done at home to improve balance and discussed exercise guidelines for adults with include balance exercises at least 3 days per week.  -Advised and counseled on a healthy lifestyle  -Reviewed patient's current diet. Advised and counseled on a whole foods based healthy diet. A summary of a healthy diet was provided in the Patient Instructions.  -reviewed patient's current physical activity level and discussed exercise guidelines for adults. Discussed community resources and ideas for safe exercise at home to assist in meeting exercise guideline recommendations in a safe and healthy way.  -Advise yearly dental visits at minimum and regular eye exams -Advised and counseled on alcohol safe limits, risks, advised to limit to one 5oz pour wine per 24 hour period Follow up: see patient instructions     Patient Instructions  I really enjoyed getting to talk with you today! I am available on Tuesdays and Thursdays for virtual visits if you have any questions or concerns, or if I can be of any further assistance.   CHECKLIST FROM ANNUAL WELLNESS VISIT:  -Follow up (please call to schedule if not scheduled after  visit):   -yearly for annual wellness visit with primary care office  Here is a list of your preventive care/health maintenance measures and the plan for each if any are due:  PLAN For any measures below that may be due:   Health Maintenance  Topic Date Due   Zoster Vaccines- Shingrix (1 of 2) Never done   Pneumonia Vaccine 61+ Years old (2 of 2 - PCV) 02/27/2020   COVID-19 Vaccine (1 - 2023-24 season) Never done   INFLUENZA VACCINE  12/15/2028 (Originally 09/16/2022)   Medicare Annual Wellness (AWV)  08/26/2023   DTaP/Tdap/Td (2 - Td or Tdap) 02/16/2024   Colonoscopy  04/29/2027   DEXA SCAN  Completed   Hepatitis C Screening  Completed   HPV VACCINES  Aged Out    -See a dentist at least yearly  -Get your eyes checked and then per your eye specialist's recommendations  -Other issues addressed today:   -I have included below further information regarding a healthy whole foods based diet, physical activity guidelines for adults, stress management and opportunities for social connections. I hope you find this information useful.   -----------------------------------------------------------------------------------------------------------------------------------------------------------------------------------------------------------------------------------------------------------  NUTRITION: -eat real food: lots of colorful vegetables (half the plate) and fruits -5-7 servings of vegetables and fruits per day (fresh or steamed is best), exp. 2 servings of vegetables with lunch and dinner and 2 servings of fruit per day. Berries and greens such as kale and collards are great choices.  -consume on a regular basis: whole grains (make sure first ingredient on label contains the word "whole"), fresh fruits, fish, nuts, seeds, healthy oils (such as olive oil, avocado oil, grape seed oil) -may eat small amounts of dairy and lean meat on occasion, but avoid processed meats such as ham,  bacon,  lunch meat, etc. -drink water -try to avoid fast food and pre-packaged foods, processed meat -most experts advise limiting sodium to < 2300mg  per day, should limit further is any chronic conditions such as high blood pressure, heart disease, diabetes, etc. The American Heart Association advised that < 1500mg  is is ideal -try to avoid foods that contain any ingredients with names you do not recognize  -try to avoid sugar/sweets (except for the natural sugar that occurs in fresh fruit) -try to avoid sweet drinks -try to avoid white rice, white bread, pasta (unless whole grain), white or yellow potatoes  EXERCISE GUIDELINES FOR ADULTS: -if you wish to increase your physical activity, do so gradually and with the approval of your doctor -STOP and seek medical care immediately if you have any chest pain, chest discomfort or trouble breathing when starting or increasing exercise  -move and stretch your body, legs, feet and arms when sitting for long periods -Physical activity guidelines for optimal health in adults: -least 150 minutes per week of aerobic exercise (can talk, but not sing) once approved by your doctor, 20-30 minutes of sustained activity or two 10 minute episodes of sustained activity every day.  -resistance training at least 2 days per week if approved by your doctor -balance exercises 3+ days per week:   Stand somewhere where you have something sturdy to hold onto if you lose balance.    1) lift up on toes, start with 5x per day and work up to 20x   2) stand and lift on leg straight out to the side so that foot is a few inches of the floor, start with 5x each side and work up to 20x each side   3) stand on one foot, start with 5 seconds each side and work up to 20 seconds on each side  If you need ideas or help with getting more active:  -Silver sneakers https://tools.silversneakers.com  -Walk with a Doc: http://www.duncan-williams.com/  -try to include resistance (weight  lifting/strength building) and balance exercises twice per week: or the following link for ideas: http://castillo-powell.com/  BuyDucts.dk  STRESS MANAGEMENT: -can try meditating, or just sitting quietly with deep breathing while intentionally relaxing all parts of your body for 5 minutes daily -if you need further help with stress, anxiety or depression please follow up with your primary doctor or contact the wonderful folks at [facility]: [phone]  SOCIAL CONNECTIONS: -options in [place] if you wish to engage in more social and exercise related activities:  -Silver sneakers https://tools.silversneakers.com  -Walk with a Doc: http://www.duncan-williams.com/  -Check out the [facility] Active Adults 50+ section on the [place] of [employer] (hiking clubs, book clubs, cards and games, chess, exercise classes, aquatic classes and much more) - see the website for details: https://www.[place].gov/departments/parks-recreation/active-adults50  -YouTube has lots of exercise videos for different ages and abilities as well  -[facility] (a variety of indoor and outdoor inperson activities for adults). [phone]. [address].  -Virtual Online Classes (a variety of topics): see seniorplanet.org or call [phone]  -consider volunteering at a school, hospice center, church, senior center or elsewhere           Terressa Koyanagi, DO

## 2022-08-26 NOTE — Patient Instructions (Addendum)
I really enjoyed getting to talk with you today! I am available on Tuesdays and Thursdays for virtual visits if you have any questions or concerns, or if I can be of any further assistance.   CHECKLIST FROM ANNUAL WELLNESS VISIT:  -Follow up (please call to schedule if not scheduled after visit):   -yearly for annual wellness visit with primary care office  Here is a list of your preventive care/health maintenance measures and the plan for each if any are due:  PLAN For any measures below that may be due:  -can get the vaccines at the pharmacy if you wish  Health Maintenance  Topic Date Due   Zoster Vaccines- Shingrix (1 of 2) Never done   Pneumonia Vaccine 4+ Years old (2 of 2 - PCV) 02/27/2020   COVID-19 Vaccine (1 - 2023-24 season) Never done   INFLUENZA VACCINE  12/15/2028 (Originally 09/16/2022)   Medicare Annual Wellness (AWV)  08/26/2023   DTaP/Tdap/Td (2 - Td or Tdap) 02/16/2024   Colonoscopy  04/29/2027   DEXA SCAN  Completed   Hepatitis C Screening  Completed   HPV VACCINES  Aged Out    -See a dentist at least yearly  -Get your eyes checked and then per your eye specialist's recommendations  -Other issues addressed today:   -I have included below further information regarding a healthy whole foods based diet, physical activity guidelines for adults, stress management and opportunities for social connections. I hope you find this information useful.   -----------------------------------------------------------------------------------------------------------------------------------------------------------------------------------------------------------------------------------------------------------  NUTRITION: -eat real food: lots of colorful vegetables (half the plate) and fruits -5-7 servings of vegetables and fruits per day (fresh or steamed is best), exp. 2 servings of vegetables with lunch and dinner and 2 servings of fruit per day. Berries and greens such as kale  and collards are great choices.  -consume on a regular basis: whole grains (make sure first ingredient on label contains the word "whole"), fresh fruits, fish, nuts, seeds, healthy oils (such as olive oil, avocado oil, grape seed oil) -may eat small amounts of dairy and lean meat on occasion, but avoid processed meats such as ham, bacon, lunch meat, etc. -drink water -try to avoid fast food and pre-packaged foods, processed meat -most experts advise limiting sodium to < 2300mg  per day, should limit further is any chronic conditions such as high blood pressure, heart disease, diabetes, etc. The American Heart Association advised that < 1500mg  is is ideal -try to avoid foods that contain any ingredients with names you do not recognize  -try to avoid sugar/sweets (except for the natural sugar that occurs in fresh fruit) -try to avoid sweet drinks -try to avoid white rice, white bread, pasta (unless whole grain), white or yellow potatoes  EXERCISE GUIDELINES FOR ADULTS: -if you wish to increase your physical activity, do so gradually and with the approval of your doctor -STOP and seek medical care immediately if you have any chest pain, chest discomfort or trouble breathing when starting or increasing exercise  -move and stretch your body, legs, feet and arms when sitting for long periods -Physical activity guidelines for optimal health in adults: -least 150 minutes per week of aerobic exercise (can talk, but not sing) once approved by your doctor, 20-30 minutes of sustained activity or two 10 minute episodes of sustained activity every day.  -resistance training at least 2 days per week if approved by your doctor -balance exercises 3+ days per week:   Stand somewhere where you have something sturdy to hold  onto if you lose balance.    1) lift up on toes, start with 5x per day and work up to 20x   2) stand and lift on leg straight out to the side so that foot is a few inches of the floor, start with  5x each side and work up to 20x each side   3) stand on one foot, start with 5 seconds each side and work up to 20 seconds on each side  If you need ideas or help with getting more active:  -Silver sneakers https://tools.silversneakers.com  -Walk with a Doc: http://www.duncan-williams.com/  -try to include resistance (weight lifting/strength building) and balance exercises twice per week: or the following link for ideas: http://castillo-powell.com/  BuyDucts.dk  STRESS MANAGEMENT: -can try meditating, or just sitting quietly with deep breathing while intentionally relaxing all parts of your body for 5 minutes daily -if you need further help with stress, anxiety or depression please follow up with your primary doctor or contact the wonderful folks at [facility]: [phone]  SOCIAL CONNECTIONS: -options in [place] if you wish to engage in more social and exercise related activities:  -Silver sneakers https://tools.silversneakers.com  -Walk with a Doc: http://www.duncan-williams.com/  -Check out the [facility] Active Adults 50+ section on the [place] of [employer] (hiking clubs, book clubs, cards and games, chess, exercise classes, aquatic classes and much more) - see the website for details: https://www.[place]-.gov/departments/parks-recreation/active-adults50  -YouTube has lots of exercise videos for different ages and abilities as well  -[facility] (a variety of indoor and outdoor inperson activities for adults). [phone]. [address]  -Virtual Online Classes (a variety of topics): see seniorplanet.org or call [phone]  -consider volunteering at a school, hospice center, church, senior center or elsewhere

## 2022-09-10 DIAGNOSIS — M47812 Spondylosis without myelopathy or radiculopathy, cervical region: Secondary | ICD-10-CM | POA: Diagnosis not present

## 2022-09-10 DIAGNOSIS — M542 Cervicalgia: Secondary | ICD-10-CM | POA: Diagnosis not present

## 2022-09-10 DIAGNOSIS — M5032 Other cervical disc degeneration, mid-cervical region, unspecified level: Secondary | ICD-10-CM | POA: Diagnosis not present

## 2022-09-12 DIAGNOSIS — M542 Cervicalgia: Secondary | ICD-10-CM | POA: Diagnosis not present

## 2022-09-12 DIAGNOSIS — M47812 Spondylosis without myelopathy or radiculopathy, cervical region: Secondary | ICD-10-CM | POA: Diagnosis not present

## 2022-09-12 DIAGNOSIS — M5032 Other cervical disc degeneration, mid-cervical region, unspecified level: Secondary | ICD-10-CM | POA: Diagnosis not present

## 2022-10-11 DIAGNOSIS — M47812 Spondylosis without myelopathy or radiculopathy, cervical region: Secondary | ICD-10-CM | POA: Diagnosis not present

## 2022-10-11 DIAGNOSIS — M5032 Other cervical disc degeneration, mid-cervical region, unspecified level: Secondary | ICD-10-CM | POA: Diagnosis not present

## 2022-10-11 DIAGNOSIS — M542 Cervicalgia: Secondary | ICD-10-CM | POA: Diagnosis not present

## 2022-10-13 DIAGNOSIS — M47812 Spondylosis without myelopathy or radiculopathy, cervical region: Secondary | ICD-10-CM | POA: Diagnosis not present

## 2022-10-13 DIAGNOSIS — M5032 Other cervical disc degeneration, mid-cervical region, unspecified level: Secondary | ICD-10-CM | POA: Diagnosis not present

## 2022-10-13 DIAGNOSIS — M542 Cervicalgia: Secondary | ICD-10-CM | POA: Diagnosis not present

## 2022-11-11 DIAGNOSIS — M5032 Other cervical disc degeneration, mid-cervical region, unspecified level: Secondary | ICD-10-CM | POA: Diagnosis not present

## 2022-11-11 DIAGNOSIS — M47812 Spondylosis without myelopathy or radiculopathy, cervical region: Secondary | ICD-10-CM | POA: Diagnosis not present

## 2022-11-11 DIAGNOSIS — M542 Cervicalgia: Secondary | ICD-10-CM | POA: Diagnosis not present

## 2022-11-13 DIAGNOSIS — M542 Cervicalgia: Secondary | ICD-10-CM | POA: Diagnosis not present

## 2022-11-13 DIAGNOSIS — M5032 Other cervical disc degeneration, mid-cervical region, unspecified level: Secondary | ICD-10-CM | POA: Diagnosis not present

## 2022-11-13 DIAGNOSIS — M47812 Spondylosis without myelopathy or radiculopathy, cervical region: Secondary | ICD-10-CM | POA: Diagnosis not present

## 2022-12-11 DIAGNOSIS — M47812 Spondylosis without myelopathy or radiculopathy, cervical region: Secondary | ICD-10-CM | POA: Diagnosis not present

## 2022-12-11 DIAGNOSIS — M542 Cervicalgia: Secondary | ICD-10-CM | POA: Diagnosis not present

## 2022-12-11 DIAGNOSIS — M5032 Other cervical disc degeneration, mid-cervical region, unspecified level: Secondary | ICD-10-CM | POA: Diagnosis not present

## 2022-12-13 DIAGNOSIS — M542 Cervicalgia: Secondary | ICD-10-CM | POA: Diagnosis not present

## 2022-12-13 DIAGNOSIS — M5032 Other cervical disc degeneration, mid-cervical region, unspecified level: Secondary | ICD-10-CM | POA: Diagnosis not present

## 2022-12-13 DIAGNOSIS — M47812 Spondylosis without myelopathy or radiculopathy, cervical region: Secondary | ICD-10-CM | POA: Diagnosis not present

## 2023-01-11 DIAGNOSIS — M542 Cervicalgia: Secondary | ICD-10-CM | POA: Diagnosis not present

## 2023-01-11 DIAGNOSIS — M47812 Spondylosis without myelopathy or radiculopathy, cervical region: Secondary | ICD-10-CM | POA: Diagnosis not present

## 2023-01-11 DIAGNOSIS — M5032 Other cervical disc degeneration, mid-cervical region, unspecified level: Secondary | ICD-10-CM | POA: Diagnosis not present

## 2023-01-13 DIAGNOSIS — M47812 Spondylosis without myelopathy or radiculopathy, cervical region: Secondary | ICD-10-CM | POA: Diagnosis not present

## 2023-01-13 DIAGNOSIS — M542 Cervicalgia: Secondary | ICD-10-CM | POA: Diagnosis not present

## 2023-01-13 DIAGNOSIS — M5032 Other cervical disc degeneration, mid-cervical region, unspecified level: Secondary | ICD-10-CM | POA: Diagnosis not present

## 2023-02-10 DIAGNOSIS — M542 Cervicalgia: Secondary | ICD-10-CM | POA: Diagnosis not present

## 2023-02-10 DIAGNOSIS — M47812 Spondylosis without myelopathy or radiculopathy, cervical region: Secondary | ICD-10-CM | POA: Diagnosis not present

## 2023-02-10 DIAGNOSIS — M5032 Other cervical disc degeneration, mid-cervical region, unspecified level: Secondary | ICD-10-CM | POA: Diagnosis not present

## 2023-02-12 DIAGNOSIS — M47812 Spondylosis without myelopathy or radiculopathy, cervical region: Secondary | ICD-10-CM | POA: Diagnosis not present

## 2023-02-12 DIAGNOSIS — M5032 Other cervical disc degeneration, mid-cervical region, unspecified level: Secondary | ICD-10-CM | POA: Diagnosis not present

## 2023-02-12 DIAGNOSIS — M542 Cervicalgia: Secondary | ICD-10-CM | POA: Diagnosis not present

## 2023-03-13 DIAGNOSIS — M47812 Spondylosis without myelopathy or radiculopathy, cervical region: Secondary | ICD-10-CM | POA: Diagnosis not present

## 2023-03-13 DIAGNOSIS — M5032 Other cervical disc degeneration, mid-cervical region, unspecified level: Secondary | ICD-10-CM | POA: Diagnosis not present

## 2023-03-13 DIAGNOSIS — M542 Cervicalgia: Secondary | ICD-10-CM | POA: Diagnosis not present

## 2023-03-15 DIAGNOSIS — M47812 Spondylosis without myelopathy or radiculopathy, cervical region: Secondary | ICD-10-CM | POA: Diagnosis not present

## 2023-03-15 DIAGNOSIS — M5032 Other cervical disc degeneration, mid-cervical region, unspecified level: Secondary | ICD-10-CM | POA: Diagnosis not present

## 2023-03-15 DIAGNOSIS — M542 Cervicalgia: Secondary | ICD-10-CM | POA: Diagnosis not present

## 2023-03-21 ENCOUNTER — Encounter: Payer: Self-pay | Admitting: Family Medicine

## 2023-03-23 ENCOUNTER — Ambulatory Visit (INDEPENDENT_AMBULATORY_CARE_PROVIDER_SITE_OTHER): Payer: PPO | Admitting: Family Medicine

## 2023-03-23 ENCOUNTER — Encounter: Payer: Self-pay | Admitting: Family Medicine

## 2023-03-23 VITALS — BP 122/80 | HR 70 | Resp 16 | Ht 64.0 in | Wt 128.2 lb

## 2023-03-23 DIAGNOSIS — G8929 Other chronic pain: Secondary | ICD-10-CM | POA: Diagnosis not present

## 2023-03-23 DIAGNOSIS — M542 Cervicalgia: Secondary | ICD-10-CM

## 2023-03-23 DIAGNOSIS — R232 Flushing: Secondary | ICD-10-CM

## 2023-03-23 DIAGNOSIS — G2581 Restless legs syndrome: Secondary | ICD-10-CM | POA: Diagnosis not present

## 2023-03-23 DIAGNOSIS — Z23 Encounter for immunization: Secondary | ICD-10-CM | POA: Diagnosis not present

## 2023-03-23 MED ORDER — DULOXETINE HCL 30 MG PO CPEP: 30.0000 mg | ORAL_CAPSULE | Freq: Every day | ORAL | 1 refills | Status: DC

## 2023-03-23 NOTE — Progress Notes (Signed)
 HPI: [name] is a 78 y.o. female with a PMHx significant for abdominal aortic atherosclerosis, OA, RLS, insomnia, and seasonal allergies, who is here today for medication management.  Last seen on 06/16/2022  Patient would like to discuss starting Cymbalta  for severe neck pain.  She has had pain for years, gradually getting worse. Follows with ortho, has had cervical MRI, underwent epidural injection,and completed PT. Neither of these help. Currently she has received 2 dry needling treatment and scheduled for tomorrow.  Pain is not radiated, it is severe, exacerbated by movement and when turning her head in bed. She endorses associated limited range of movement.   She was prescribed her tizanidine , which didn't help either. He told her the next step would be surgery if nothing else can relieve her pain.   She is currently taking Paroxetine  10 mg for hot flashes for about 7 years.   Hasn't been taking Requip  0.5 mg for RLS because she hasn't had any problems since her last visit.  Negative for LE edema or pain.  Review of Systems  Constitutional:  Negative for appetite change, chills and fever.  HENT:  Negative for sore throat and trouble swallowing.   Respiratory:  Negative for cough and shortness of breath.   Cardiovascular:  Negative for chest pain.  Gastrointestinal:  Negative for abdominal pain, nausea and vomiting.  Genitourinary:  Positive for decreased urine volume. Negative for dysuria and hematuria.  Musculoskeletal:  Positive for neck pain. Negative for gait problem.  Skin:  Negative for rash.  Neurological:  Negative for weakness and headaches.  See other pertinent positives and negatives in HPI.  Current Outpatient Medications on File Prior to Visit  Medication Sig Dispense Refill   PARoxetine  (PAXIL ) 10 MG tablet Take 10 mg by mouth daily.     No current facility-administered medications on file prior to visit.   Past Medical History:  Diagnosis Date    Adenomatous colon polyp    tubular   Arthritis    Chicken pox    Diverticulosis    Hernia, femoral    pt stated surgery in august of 2022, on left side    PONV (postoperative nausea and vomiting)    Seasonal allergies    No Known Allergies  Social History   Socioeconomic History   Marital status: Married    Spouse name: Not on file   Number of children: 0   Years of education: 16   Highest education level: Associate degree: academic program  Occupational History   Occupation: works occasionally     Comment: retired   Tobacco Use   Smoking status: Former    Current packs/day: 0.00    Types: Cigarettes    Quit date: 04/05/1967    Years since quitting: 56.0   Smokeless tobacco: Never   Tobacco comments:    rare use in college   Vaping Use   Vaping status: Never Used  Substance and Sexual Activity   Alcohol use: Yes    Comment: social   Drug use: No   Sexual activity: Not on file  Other Topics Concern   Not on file  Social History Narrative   Married   No children   HH 2   Yard work, programmer, applications   Works occasionally    Social Drivers of Corporate Investment Banker Strain: Low Risk  (03/22/2023)   Overall Financial Resource Strain (CARDIA)    Difficulty of Paying Living Expenses: Not hard at all  Food  Insecurity: No Food Insecurity (03/22/2023)   Hunger Vital Sign    Worried About Running Out of Food in the Last Year: Never true    Ran Out of Food in the Last Year: Never true  Transportation Needs: No Transportation Needs (03/22/2023)   PRAPARE - Administrator, Civil Service (Medical): No    Lack of Transportation (Non-Medical): No  Physical Activity: Sufficiently Active (03/22/2023)   Exercise Vital Sign    Days of Exercise per Week: 3 days    Minutes of Exercise per Session: 60 min  Stress: No Stress Concern Present (03/22/2023)   [employer] of Occupational Health - Occupational Stress Questionnaire    Feeling of Stress : Only a little   Social Connections: Socially Integrated (03/22/2023)   Social Connection and Isolation Panel [NHANES]    Frequency of Communication with Friends and Family: More than three times a week    Frequency of Social Gatherings with Friends and Family: More than three times a week    Attends Religious Services: More than 4 times per year    Active Member of [employer] or Organizations: Yes    Attends Banker Meetings: 1 to 4 times per year    Marital Status: Married   Today's Vitals   03/23/23 1250  BP: 122/80  Pulse: 70  Resp: 16  SpO2: 98%  Weight: 128 lb 4 oz (58.2 kg)  Height: 5' 4 (1.626 m)   Body mass index is 22.01 kg/m.  Physical Exam Vitals and nursing note reviewed.  Constitutional:      General: She is not in acute distress.    Appearance: She is well-developed.  HENT:     Head: Normocephalic and atraumatic.     Mouth/Throat:     Mouth: Mucous membranes are moist.     Pharynx: Oropharynx is clear.  Eyes:     Conjunctiva/sclera: Conjunctivae normal.  Cardiovascular:     Rate and Rhythm: Normal rate and regular rhythm.     Heart sounds: No murmur heard. Pulmonary:     Effort: Pulmonary effort is normal. No respiratory distress.     Breath sounds: Normal breath sounds.  Musculoskeletal:     Cervical back: Spasms present. No tenderness or bony tenderness. No pain with movement. Decreased range of motion.  Lymphadenopathy:     Cervical: No cervical adenopathy.  Skin:    General: Skin is warm.     Findings: No erythema or rash.  Neurological:     General: No focal deficit present.     Mental Status: She is alert and oriented to person, place, and time.     Cranial Nerves: No cranial nerve deficit.     Gait: Gait normal.  Psychiatric:        Mood and Affect: Mood and affect normal.   ASSESSMENT AND PLAN:  [name] was seen today for medication management.   Orders Placed This Encounter  Procedures   Pneumococcal conjugate vaccine 20-valent (Prevnar  20)   Chronic neck pain Assessment & Plan: She is interested in trying Cymbalta , some side effects discussed as well as the risk of med interaction with Paroxetine . Start Cymbalta  30 mg daily. Instructed to let me know in 6-8 week if mediation is helping, we could increase to 60 mg if needed. Continue dry needling.  Orders: -     DULoxetine  HCl; Take 1 capsule (30 mg total) by mouth daily.  Dispense: 30 capsule; Refill: 1  RLS (restless legs syndrome) Assessment &  Plan: She has been asymptomatic since her last visit, so has not started Requip . We discussed new recommendations, if symptoms reoccured ,we can try iron supplementation + non pharmacologic options.   Hot flash not due to menopause Assessment & Plan: She has been on Paroxetine  10 mg for about 7 years and has helped. Because we are starting Cymbalta  today, recommend trying to decrease dose, for now alternate between 10 mg an 5 mg every other day. Cymbalta  may also help with this problem.   Need for pneumococcal vaccination -     Pneumococcal conjugate vaccine 20-valent   Return if symptoms worsen or fail to improve, for keep next appointment.  I, Leonce PARAS Wierda, acting as a scribe for Jewel Mcafee, MD., have documented all relevant documentation on the behalf of Wylder Macomber, MD, as directed by  Shaquel Chavous, MD while in the presence of Nyomie Ehrlich, MD.   I, Anjoli Diemer, MD, have reviewed all documentation for this visit. The documentation on 03/24/23 for the exam, diagnosis, procedures, and orders are all accurate and complete.  Ritha Sampedro G. Chaunda Vandergriff, MD  [facility]. [place] office.

## 2023-03-23 NOTE — Patient Instructions (Addendum)
 A few things to remember from today's visit:  Chronic neck pain Duloxetine  (Cymbalta ) 30 mg started today. Try to decrease Paroxetine  from 10 mg daily to alternating between 10 mg and 5 mg every other day. Let me know in 6-8 weeks if Cymbalta  is helping, we can increase it to 60 mg, in which case we may need to stop Paroxetine .  If restless leg symptoms start again, we can consider iron supplementation.  If you need refills for medications you take chronically, please call your pharmacy. Do not use My Chart to request refills or for acute issues that need immediate attention. If you send a my chart message, it may take a few days to be addressed, specially if I am not in the office.  Please be sure medication list is accurate. If a new problem present, please set up appointment sooner than planned today.

## 2023-03-24 ENCOUNTER — Encounter: Payer: Self-pay | Admitting: Family Medicine

## 2023-03-24 NOTE — Assessment & Plan Note (Signed)
 She is interested in trying Cymbalta , some side effects discussed as well as the risk of med interaction with Paroxetine . Start Cymbalta  30 mg daily. Instructed to let me know in 6-8 week if mediation is helping, we could increase to 60 mg if needed. Continue dry needling.

## 2023-03-24 NOTE — Assessment & Plan Note (Signed)
 She has been asymptomatic since her last visit, so has not started Requip . We discussed new recommendations, if symptoms reoccured ,we can try iron supplementation + non pharmacologic options.

## 2023-03-24 NOTE — Assessment & Plan Note (Addendum)
 She has been on Paroxetine 10 mg for about 7 years and has helped. Because we are starting Cymbalta  today, recommend trying to decrease dose, for now alternate between 10 mg an 5 mg every other day. Cymbalta  may also help with this problem.

## 2023-03-28 DIAGNOSIS — F329 Major depressive disorder, single episode, unspecified: Secondary | ICD-10-CM | POA: Diagnosis not present

## 2023-03-28 DIAGNOSIS — Z01419 Encounter for gynecological examination (general) (routine) without abnormal findings: Secondary | ICD-10-CM | POA: Diagnosis not present

## 2023-03-28 DIAGNOSIS — Z1231 Encounter for screening mammogram for malignant neoplasm of breast: Secondary | ICD-10-CM | POA: Diagnosis not present

## 2023-03-28 DIAGNOSIS — Z6822 Body mass index (BMI) 22.0-22.9, adult: Secondary | ICD-10-CM | POA: Diagnosis not present

## 2023-04-13 DIAGNOSIS — M5032 Other cervical disc degeneration, mid-cervical region, unspecified level: Secondary | ICD-10-CM | POA: Diagnosis not present

## 2023-04-13 DIAGNOSIS — M542 Cervicalgia: Secondary | ICD-10-CM | POA: Diagnosis not present

## 2023-04-13 DIAGNOSIS — M47812 Spondylosis without myelopathy or radiculopathy, cervical region: Secondary | ICD-10-CM | POA: Diagnosis not present

## 2023-04-15 DIAGNOSIS — M5032 Other cervical disc degeneration, mid-cervical region, unspecified level: Secondary | ICD-10-CM | POA: Diagnosis not present

## 2023-04-15 DIAGNOSIS — M542 Cervicalgia: Secondary | ICD-10-CM | POA: Diagnosis not present

## 2023-04-15 DIAGNOSIS — M47812 Spondylosis without myelopathy or radiculopathy, cervical region: Secondary | ICD-10-CM | POA: Diagnosis not present

## 2023-05-09 DIAGNOSIS — M542 Cervicalgia: Secondary | ICD-10-CM | POA: Diagnosis not present

## 2023-05-13 DIAGNOSIS — M47812 Spondylosis without myelopathy or radiculopathy, cervical region: Secondary | ICD-10-CM | POA: Diagnosis not present

## 2023-05-13 DIAGNOSIS — M542 Cervicalgia: Secondary | ICD-10-CM | POA: Diagnosis not present

## 2023-05-13 DIAGNOSIS — M5032 Other cervical disc degeneration, mid-cervical region, unspecified level: Secondary | ICD-10-CM | POA: Diagnosis not present

## 2023-05-18 ENCOUNTER — Other Ambulatory Visit: Payer: Self-pay | Admitting: Family Medicine

## 2023-05-18 DIAGNOSIS — G8929 Other chronic pain: Secondary | ICD-10-CM

## 2023-05-25 DIAGNOSIS — M9903 Segmental and somatic dysfunction of lumbar region: Secondary | ICD-10-CM | POA: Diagnosis not present

## 2023-05-25 DIAGNOSIS — M47812 Spondylosis without myelopathy or radiculopathy, cervical region: Secondary | ICD-10-CM | POA: Diagnosis not present

## 2023-05-25 DIAGNOSIS — S39012A Strain of muscle, fascia and tendon of lower back, initial encounter: Secondary | ICD-10-CM | POA: Diagnosis not present

## 2023-05-25 DIAGNOSIS — S29012A Strain of muscle and tendon of back wall of thorax, initial encounter: Secondary | ICD-10-CM | POA: Diagnosis not present

## 2023-05-25 DIAGNOSIS — M9902 Segmental and somatic dysfunction of thoracic region: Secondary | ICD-10-CM | POA: Diagnosis not present

## 2023-05-25 DIAGNOSIS — M9901 Segmental and somatic dysfunction of cervical region: Secondary | ICD-10-CM | POA: Diagnosis not present

## 2023-06-13 DIAGNOSIS — M47812 Spondylosis without myelopathy or radiculopathy, cervical region: Secondary | ICD-10-CM | POA: Diagnosis not present

## 2023-06-13 DIAGNOSIS — M542 Cervicalgia: Secondary | ICD-10-CM | POA: Diagnosis not present

## 2023-06-13 DIAGNOSIS — M5032 Other cervical disc degeneration, mid-cervical region, unspecified level: Secondary | ICD-10-CM | POA: Diagnosis not present

## 2023-06-20 ENCOUNTER — Ambulatory Visit: Admitting: Orthopaedic Surgery

## 2023-06-20 DIAGNOSIS — M5412 Radiculopathy, cervical region: Secondary | ICD-10-CM | POA: Diagnosis not present

## 2023-07-01 DIAGNOSIS — M5412 Radiculopathy, cervical region: Secondary | ICD-10-CM | POA: Diagnosis not present

## 2023-07-04 ENCOUNTER — Ambulatory Visit: Admitting: Orthopaedic Surgery

## 2023-07-04 DIAGNOSIS — M7062 Trochanteric bursitis, left hip: Secondary | ICD-10-CM | POA: Diagnosis not present

## 2023-07-04 MED ORDER — METHYLPREDNISOLONE ACETATE 40 MG/ML IJ SUSP
40.0000 mg | INTRAMUSCULAR | Status: AC | PRN
  Administered 2023-07-04: 40 mg via INTRA_ARTICULAR

## 2023-07-04 MED ORDER — LIDOCAINE HCL 1 % IJ SOLN
3.0000 mL | INTRAMUSCULAR | Status: AC | PRN
  Administered 2023-07-04: 3 mL

## 2023-07-04 NOTE — Progress Notes (Signed)
 The patient is well-known to us .  She from time to time comes in for steroid injection of her left hip trochanteric area.  It has been over 2 years since she has had an injection.  She is very active and young appearing 57.  She denies any specific injuries.  She is not a diabetic.  She is hiking on a trip next week.  The pain is over the same area that she has had injections before that have helped significantly.  Examination of her left and right hip show they are normal in terms of range of motion and no pain in the groin at all.  She does have pain over the trochanteric area on the left hip and the proximal IT band.  Her signs and symptoms and clinical exam are consistent with left hip trochanteric bursitis and IT band syndrome with tendinitis.  Per her request I did place a steroid injection over this area which she tolerated well.  She has taken meloxicam as well.  All question concerns were answered addressed.  Follow-up can be as needed.    Procedure Note  Patient: Audrey Tucker             Date of Birth: [birth year]           MRN: [id number]             Visit Date: 07/04/2023  Procedures: Visit Diagnoses:  1. Trochanteric bursitis, left hip     Large Joint Inj: L greater trochanter on 07/04/2023 4:38 PM Indications: pain and diagnostic evaluation Details: 22 G 1.5 in needle, lateral approach  Arthrogram: No  Medications: 3 mL lidocaine  1 %; 40 mg methylPREDNISolone  acetate 40 MG/ML Outcome: tolerated well, no immediate complications Procedure, treatment alternatives, risks and benefits explained, specific risks discussed. Consent was given by the patient. Immediately prior to procedure a time out was called to verify the correct patient, procedure, equipment, support staff and site/side marked as required. Patient was prepped and draped in the usual sterile fashion.

## 2023-07-13 DIAGNOSIS — M47812 Spondylosis without myelopathy or radiculopathy, cervical region: Secondary | ICD-10-CM | POA: Diagnosis not present

## 2023-07-13 DIAGNOSIS — M542 Cervicalgia: Secondary | ICD-10-CM | POA: Diagnosis not present

## 2023-07-13 DIAGNOSIS — M5032 Other cervical disc degeneration, mid-cervical region, unspecified level: Secondary | ICD-10-CM | POA: Diagnosis not present

## 2023-07-27 ENCOUNTER — Emergency Department (HOSPITAL_BASED_OUTPATIENT_CLINIC_OR_DEPARTMENT_OTHER): Admission: EM | Admit: 2023-07-27 | Discharge: 2023-07-27 | Disposition: A | Discharge status: Home / Self Care

## 2023-07-27 ENCOUNTER — Emergency Department (HOSPITAL_BASED_OUTPATIENT_CLINIC_OR_DEPARTMENT_OTHER): Admitting: Radiology

## 2023-07-27 ENCOUNTER — Encounter (HOSPITAL_BASED_OUTPATIENT_CLINIC_OR_DEPARTMENT_OTHER): Payer: Self-pay

## 2023-07-27 ENCOUNTER — Other Ambulatory Visit: Payer: Self-pay

## 2023-07-27 DIAGNOSIS — S300XXA Contusion of lower back and pelvis, initial encounter: Secondary | ICD-10-CM | POA: Diagnosis not present

## 2023-07-27 DIAGNOSIS — W19XXXA Unspecified fall, initial encounter: Secondary | ICD-10-CM

## 2023-07-27 DIAGNOSIS — M1611 Unilateral primary osteoarthritis, right hip: Secondary | ICD-10-CM | POA: Diagnosis not present

## 2023-07-27 DIAGNOSIS — Z043 Encounter for examination and observation following other accident: Secondary | ICD-10-CM | POA: Diagnosis not present

## 2023-07-27 DIAGNOSIS — W1781XA Fall down embankment (hill), initial encounter: Secondary | ICD-10-CM | POA: Diagnosis not present

## 2023-07-27 DIAGNOSIS — R109 Unspecified abdominal pain: Secondary | ICD-10-CM | POA: Diagnosis not present

## 2023-07-27 DIAGNOSIS — S2231XA Fracture of one rib, right side, initial encounter for closed fracture: Secondary | ICD-10-CM | POA: Insufficient documentation

## 2023-07-27 MED ORDER — HYDROCODONE-ACETAMINOPHEN 5-325 MG PO TABS: 1.0000 | ORAL_TABLET | Freq: Four times a day (QID) | ORAL | 0 refills | Status: DC | PRN

## 2023-07-27 MED ORDER — LIDOCAINE 5 % EX PTCH
1.0000 | MEDICATED_PATCH | CUTANEOUS | Status: DC
  Administered 2023-07-27: 1 via TRANSDERMAL
  Filled 2023-07-27: qty 1

## 2023-07-27 MED ORDER — METHOCARBAMOL 500 MG PO TABS
500.0000 mg | ORAL_TABLET | Freq: Once | ORAL | Status: AC
  Administered 2023-07-27: 500 mg via ORAL
  Filled 2023-07-27: qty 1

## 2023-07-27 MED ORDER — METHOCARBAMOL 500 MG PO TABS: 500.0000 mg | ORAL_TABLET | Freq: Two times a day (BID) | ORAL | 0 refills | Status: DC

## 2023-07-27 MED ORDER — NAPROXEN 500 MG PO TABS: 500.0000 mg | ORAL_TABLET | Freq: Two times a day (BID) | ORAL | 0 refills | Status: DC

## 2023-07-27 MED ORDER — LIDOCAINE 5 % EX PTCH: 1.0000 | MEDICATED_PATCH | CUTANEOUS | 0 refills | Status: DC

## 2023-07-27 MED ORDER — HYDROCODONE-ACETAMINOPHEN 5-325 MG PO TABS
1.0000 | ORAL_TABLET | Freq: Once | ORAL | Status: AC
  Administered 2023-07-27: 1 via ORAL
  Filled 2023-07-27: qty 1

## 2023-07-27 NOTE — Discharge Instructions (Addendum)
 Your x-ray shows rib fracture.  I would recommend taking naproxen twice daily make sure you take this medicine with food.  You can also take 1000 mg of Tylenol  every 8 hours.  Use lidocaine  patch over area of pain, ice and heat.  You can also try Robaxin  which is a muscle relaxer, do not drive or operate heavy machinery with this medicine.  If you have breakthrough pain I would recommend taking Norco.  Use incentive spirometer as discussed.  Follow-up with primary care.  Return to ER with new or worsening symptoms.

## 2023-07-27 NOTE — ED Notes (Signed)
 RT Note: Patient was educated on the use of an incentive spirometer. Tolerated well

## 2023-07-27 NOTE — ED Provider Notes (Signed)
 [place] EMERGENCY DEPARTMENT AT [facility] Provider Note   CSN: [id number] Arrival date & time: 07/27/23  1421     History  Chief Complaint  Patient presents with   Rib Injury    R   Tailbone Pain   Fall    1wk ago    Audrey Tucker is a 78 y.o. female.  Patient with past medical history of anxiety is presenting to emergency room after fall.  Patient reports that 1 week ago she was walking when she lost her balance falling on a small hill.  She struck her right side against a tree during the fall and landed on her buttock. Since than has had right sided flank pain, worse when moving side to side, has been trying OTC medications with mild improvement. Denies CP, SOB, fever, cough. Also note pain in tailbone with sitting upright on the area, no pain with ambulation. NO associated saddle anesthesia, numbness or tingling, loss of bowel or bladder.   Fall Associated symptoms include chest pain.       Home Medications Prior to Admission medications   Medication Sig Start Date End Date Taking? Authorizing Provider  DULoxetine  (CYMBALTA ) 30 MG capsule TAKE 1 CAPSULE BY MOUTH EVERY DAY 05/18/23   [place], Betty G, MD  PARoxetine (PAXIL) 10 MG tablet Take 10 mg by mouth daily.    [provider]      Allergies    Patient has no known allergies.    Review of Systems   Review of Systems  Cardiovascular:  Positive for chest pain.    Physical Exam Updated Vital Signs BP (!) 146/74   Pulse 65   Temp 97.6 F (36.4 C)   Resp 16   SpO2 98%  Physical Exam Vitals and nursing note reviewed.  Constitutional:      General: She is not in acute distress.    Appearance: She is not toxic-appearing.  HENT:     Head: Normocephalic and atraumatic.  Eyes:     General: No scleral icterus.    Conjunctiva/sclera: Conjunctivae normal.  Cardiovascular:     Rate and Rhythm: Normal rate and regular rhythm.     Pulses: Normal pulses.     Heart sounds: Normal heart  sounds.  Pulmonary:     Effort: Pulmonary effort is normal. No respiratory distress.     Breath sounds: Normal breath sounds.  Chest:     Chest wall: Tenderness present.  Abdominal:     General: Abdomen is flat. Bowel sounds are normal.     Palpations: Abdomen is soft.     Tenderness: There is no abdominal tenderness.  Musculoskeletal:     Right lower leg: No edema.     Left lower leg: No edema.     Comments: Patient has tenderness to palpation over posterior lateral rib over suspected 11th and 12th rib.  No deformity, ecchymosis or rash.  Tenderness to palpation over coccyx bilaterally.  No bruising or rash.  Skin:    General: Skin is warm and dry.     Findings: No lesion.  Neurological:     General: No focal deficit present.     Mental Status: She is alert and oriented to person, place, and time. Mental status is at baseline.     ED Results / Procedures / Treatments   Labs (all labs ordered are listed, but only abnormal results are displayed) Labs Reviewed - No data to display  EKG None  Radiology DG Sacrum/Coccyx Result Date:  07/27/2023 CLINICAL DATA:  Mechanical fall. EXAM: SACRUM AND COCCYX - 2+ VIEW COMPARISON:  CT scan abdomen and pelvis from 04/15/2022. FINDINGS: No acute fracture or dislocation. There is leftward angulation of the lower sacrum/coccyx however, essentially similar to the prior CT scan. No acute fracture seen. No aggressive osseous lesion. Normal and symmetric sacroiliac joints. Visualized sacral arcuate lines are unremarkable. Unremarkable symphysis pubis. There are mild degenerative changes of the right hip joint characterized by mildly reduced joint space and osteophytosis of the superior acetabulum. No radiopaque foreign bodies. IMPRESSION: *No acute osseous abnormality of the sacrum/coccyx. Electronically Signed   By: Beula Brunswick M.D.   On: 07/27/2023 15:39   DG Ribs Unilateral W/Chest Right Result Date: 07/27/2023 CLINICAL DATA:  Mechanical fall.   Right-sided rib pain. EXAM: RIGHT RIBS AND CHEST - 3+ VIEW COMPARISON:  None Available. FINDINGS: Bilateral lung fields are clear. Bilateral costophrenic angles are clear. Normal cardio-mediastinal silhouette. There is subtle minimally displaced fracture of the posterior right twelfth rib. The soft tissues are within normal limits. IMPRESSION: *Subtle minimally displaced fracture of the posterior right twelfth rib. No pneumothorax or pleural effusion. Electronically Signed   By: Beula Brunswick M.D.   On: 07/27/2023 15:37    Procedures Procedures    Medications Ordered in ED Medications  lidocaine  (LIDODERM ) 5 % 1 patch (1 patch Transdermal Patch Applied 07/27/23 1506)  HYDROcodone-acetaminophen  (NORCO/VICODIN) 5-325 MG per tablet 1 tablet (1 tablet Oral Given 07/27/23 1506)  methocarbamol  (ROBAXIN ) tablet 500 mg (500 mg Oral Given 07/27/23 1506)    ED Course/ Medical Decision Making/ A&P                                 Medical Decision Making Amount and/or Complexity of Data Reviewed Radiology: ordered.  Risk Prescription drug management.   This patient presents to the ED for concern of fall, this involves an extensive number of treatment options, and is a complaint that carries with it a high risk of complications and morbidity.  The differential diagnosis includes intracranial hemorrhage, cervical fracture, rib fracture, pulmonary embolism, kidney stone, urinary tract infection   Imaging Studies ordered:  I ordered imaging studies including right rib view, sacrum/coccx x-ray  I independently visualized and interpreted imaging which showed right 12th posterior rib fracture, negative sacrum  I agree with the radiologist interpretation   Problem List / ED Course / Critical interventions / Medication management  Patient reporting to emergency room after fall.  She is hemodynamically stable and well-appearing.  She is not on blood thinner.  When she fell she did not hit her head or  lose consciousness.  She denies headache or neck pain.  On my exam she has no tenderness to palpation over cervical thoracic or lumbar spine.  She does have tenderness over coccyx.  She is ambulatory with steady gait, no radicular symptoms.  No red flag symptoms associate with back pain.  Also has tenderness to palpation over area of 12 right posterolateral rib.  X-ray shows 1/12 rib fracture which feels consistent with patient's clinical presentation.  She denies any chest pain, radiating pain, shortness of breath cough or fever.  Her symptoms were well-controlled with Norco lidocaine  patch and Robaxin .  She was given incentive spirometry.  Discussed follow-up and return precautions. I ordered medication including Norco, lidoderm , robaxin   Reevaluation of the patient after these medicines showed that the patient improved I have reviewed the patients home medicines  and have made adjustments as needed   Plan F/u w/ PCP in 2-3d to ensure resolution of sx.  Patient was given return precautions. Patient stable for discharge at this time.  Patient educated on sx/dx and verbalized understanding of plan. Return to ER w/ new or worsening sx.          Final Clinical Impression(s) / ED Diagnoses Final diagnoses:  Closed fracture of one rib of right side, initial encounter  Fall, initial encounter  Contusion of buttock, initial encounter    Rx / DC Orders ED Discharge Orders          Ordered    naproxen (NAPROSYN) 500 MG tablet  2 times daily        07/27/23 1606    methocarbamol  (ROBAXIN ) 500 MG tablet  2 times daily        07/27/23 1606    lidocaine  (LIDODERM ) 5 %  Every 24 hours        07/27/23 1606    HYDROcodone-acetaminophen  (NORCO/VICODIN) 5-325 MG tablet  Every 6 hours PRN        07/27/23 1606              Nene Aranas, Kandace Organ, PA-C 07/27/23 1625    Carin Charleston, MD 07/28/23 1530

## 2023-07-27 NOTE — ED Triage Notes (Signed)
 Pt c/o mechanical fall approx 1wk ago down a small hillside, continued R sided rib pain, tailbone pain since. Pain worse w changing positions  No meds PTA

## 2023-07-28 ENCOUNTER — Telehealth: Payer: Self-pay

## 2023-07-28 NOTE — Transitions of Care (Post Inpatient/ED Visit) (Signed)
   07/28/2023  Name: Audrey Tucker MRN: [id number] DOB: May 12, 1945  Today's TOC FU Call Status: Today's TOC FU Call Status:: Successful TOC FU Call Completed Patient's Name and Date of Birth confirmed.  Transition Care Management Follow-up Telephone Call Date of Discharge: 07/27/23 Discharge Facility: [facility] ([facility] Type of Discharge: Emergency Department Reason for ED Visit: Other: (Fall) How have you been since you were released from the hospital?: Same Any questions or concerns?: No  Items Reviewed: Did you receive and understand the discharge instructions provided?: Yes Medications obtained,verified, and reconciled?: Yes (Medications Reviewed) Any new allergies since your discharge?: No Dietary orders reviewed?: No Do you have support at home?: Yes People in Home [RPT]: spouse  Medications Reviewed Today: Medications Reviewed Today   Medications were not reviewed in this encounter     Home Care and Equipment/Supplies: Were Home Health Services Ordered?: No Any new equipment or medical supplies ordered?: No  Functional Questionnaire: Do you need assistance with bathing/showering or dressing?: No Do you need assistance with meal preparation?: No Do you need assistance with eating?: No Do you have difficulty maintaining continence: No Do you need assistance with getting out of bed/getting out of a chair/moving?: No Do you have difficulty managing or taking your medications?: No  Follow up appointments reviewed: PCP Follow-up appointment confirmed?: Yes Date of PCP follow-up appointment?: 08/01/23 Follow-up Provider: Dr. Betty [place] Specialist [facility] Follow-up appointment confirmed?: No Do you need transportation to your follow-up appointment?: No    SIGNATURE: Shea Kapur, CMA

## 2023-08-01 ENCOUNTER — Encounter: Payer: Self-pay | Admitting: Family Medicine

## 2023-08-01 ENCOUNTER — Ambulatory Visit (INDEPENDENT_AMBULATORY_CARE_PROVIDER_SITE_OTHER): Admitting: Family Medicine

## 2023-08-01 VITALS — BP 122/70 | HR 73 | Temp 98.1°F | Resp 16 | Ht 64.0 in | Wt 123.0 lb

## 2023-08-01 DIAGNOSIS — S2241XD Multiple fractures of ribs, right side, subsequent encounter for fracture with routine healing: Secondary | ICD-10-CM | POA: Diagnosis not present

## 2023-08-01 DIAGNOSIS — M791 Myalgia, unspecified site: Secondary | ICD-10-CM | POA: Diagnosis not present

## 2023-08-01 DIAGNOSIS — E785 Hyperlipidemia, unspecified: Secondary | ICD-10-CM | POA: Diagnosis not present

## 2023-08-01 DIAGNOSIS — W19XXXD Unspecified fall, subsequent encounter: Secondary | ICD-10-CM

## 2023-08-01 DIAGNOSIS — M47812 Spondylosis without myelopathy or radiculopathy, cervical region: Secondary | ICD-10-CM | POA: Diagnosis not present

## 2023-08-01 MED ORDER — TRAMADOL HCL 50 MG PO TABS: 50.0000 mg | ORAL_TABLET | Freq: Two times a day (BID) | ORAL | 0 refills | Status: AC | PRN

## 2023-08-01 NOTE — Patient Instructions (Addendum)
 A few things to remember from today's visit:  Closed fracture of multiple ribs of right side with routine healing, subsequent encounter - Plan: traMADol (ULTRAM) 50 MG tablet  Hyperlipidemia, unspecified hyperlipidemia type - Plan: Comprehensive metabolic panel with GFR, Lipid panel  Tramadol 2 times daily as needed, do not take with Paroxetine. Continue using breathing device.  If you need refills for medications you take chronically, please call your pharmacy. Do not use My Chart to request refills or for acute issues that need immediate attention. If you send a my chart message, it may take a few days to be addressed, specially if I am not in the office.  Please be sure medication list is accurate. If a new problem present, please set up appointment sooner than planned today.

## 2023-08-01 NOTE — Assessment & Plan Note (Signed)
Continue nonpharmacologic treatment. Further recommendation will be given according to lipid panel results. 

## 2023-08-01 NOTE — Progress Notes (Unsigned)
 ACUTE VISIT Chief Complaint  Patient presents with   Hospitalization Follow-up   HPI: Audrey Tucker is a 78 y.o. female with a PMHx significant for abdominal aortic atherosclerosis, OA, RLS, insomnia, and seasonal allergies, who is here today for ED follow up.   Patient was seen in the ED on 6/11 for a fall. She fell when slipping on wet leaves and rolled into a tree, landed on rib cage.  X-rays showed a right rib fracture. Also had saccral x-ray which was unremarkable.   Currently, she says she only has pain when getting up from sitting. She is able to sit or walk without pain. The pain is relieved with a heating pad.  She was given naproxen  to take twice daily but she stopped because of GI side effects. Also given Norco but has not been taking it, caused severe drowsiness when given in the ED. She is hoping to get lidocaine  patches ordered from the ED approved by her insurance.  Pain interferes with sleep.  She was given a spirometer and has been using it.  Denies any cough, wheezing, SOB, fever, chills, or chest pain.   DG Ribs Unilateral W/Chest Right 07/27/2023 Impression: *Subtle minimally displaced fracture of the posterior right twelfth rib. No pneumothorax or pleural effusion.   She is still taking Paxil 10 mg daily for hot flashes. It does help. Follows with gynecologist regularly.  Due for follow up, would like blood work done.  HLD:On non pharmacologic treatment.    Component Value Date/Time   CHOL 199 06/18/2022 1027   TRIG 66.0 06/18/2022 1027   HDL 81.10 06/18/2022 1027   CHOLHDL 2 06/18/2022 1027   VLDL 13.2 06/18/2022 1027   LDLCALC 104 (H) 06/18/2022 1027   Review of Systems  Constitutional:  Positive for activity change. Negative for appetite change and fever.  HENT:  Negative for sore throat and trouble swallowing.   Eyes:  Negative for redness and visual disturbance.  Respiratory:  Negative for cough, shortness of breath and wheezing.    Cardiovascular:  Negative for chest pain, palpitations and leg swelling.  Gastrointestinal:  Negative for abdominal pain, nausea and vomiting.  Genitourinary:  Negative for decreased urine volume, dysuria and hematuria.  Skin:  Negative for rash.  Neurological:  Negative for syncope, weakness and headaches.  See other pertinent positives and negatives in HPI.  Current Outpatient Medications on File Prior to Visit  Medication Sig Dispense Refill   lidocaine  (LIDODERM ) 5 % Place 1 patch onto the skin daily. Remove & Discard patch within 12 hours or as directed by MD 30 patch 0   PARoxetine (PAXIL) 10 MG tablet Take 10 mg by mouth daily.     methocarbamol  (ROBAXIN ) 500 MG tablet Take 1 tablet (500 mg total) by mouth 2 (two) times daily. (Patient not taking: Reported on 08/01/2023) 20 tablet 0   No current facility-administered medications on file prior to visit.    Past Medical History:  Diagnosis Date   Adenomatous colon polyp    tubular   Arthritis    Chicken pox    Diverticulosis    Hernia, femoral    pt stated surgery in august of 2022, on left side    PONV (postoperative nausea and vomiting)    Seasonal allergies    No Known Allergies  Social History   Socioeconomic History   Marital status: Married    Spouse name: Not on file   Number of children: 0   Years of education: 25  Highest education level: Associate degree: academic program  Occupational History   Occupation: works occasionally     Comment: retired   Tobacco Use   Smoking status: Former    Current packs/day: 0.00    Average packs/day: 0.5 packs/day for 2.0 years (1.0 ttl pk-yrs)    Types: Cigarettes    Quit date: 04/05/1967    Years since quitting: 56.3   Smokeless tobacco: Never   Tobacco comments:    rare use in college   Vaping Use   Vaping status: Never Used  Substance and Sexual Activity   Alcohol use: Yes    Alcohol/week: 3.0 standard drinks of alcohol    Types: 3 Glasses of wine per week     Comment: social   Drug use: No   Sexual activity: Not Currently  Other Topics Concern   Not on file  Social History Narrative   Married   No children   HH 2   Yard work, Programmer, applications   Works occasionally    Social Drivers of Corporate investment banker Strain: Low Risk  (03/22/2023)   Overall Financial Resource Strain (CARDIA)    Difficulty of Paying Living Expenses: Not hard at all  Food Insecurity: No Food Insecurity (03/22/2023)   Hunger Vital Sign    Worried About Running Out of Food in the Last Year: Never true    Ran Out of Food in the Last Year: Never true  Transportation Needs: No Transportation Needs (03/22/2023)   PRAPARE - Administrator, Civil Service (Medical): No    Lack of Transportation (Non-Medical): No  Physical Activity: Sufficiently Active (03/22/2023)   Exercise Vital Sign    Days of Exercise per Week: 3 days    Minutes of Exercise per Session: 60 min  Stress: No Stress Concern Present (03/22/2023)   [employer] of Occupational Health - Occupational Stress Questionnaire    Feeling of Stress : Only a little  Social Connections: Socially Integrated (03/22/2023)   Social Connection and Isolation Panel    Frequency of Communication with Friends and Family: More than three times a week    Frequency of Social Gatherings with Friends and Family: More than three times a week    Attends Religious Services: More than 4 times per year    Active Member of [employer] or Organizations: Yes    Attends Banker Meetings: 1 to 4 times per year    Marital Status: Married    Vitals:   08/01/23 1252  BP: 122/70  Pulse: 73  Resp: 16  Temp: 98.1 F (36.7 C)  SpO2: 97%   Body mass index is 21.11 kg/m.  Physical Exam Vitals and nursing note reviewed.  Constitutional:      General: She is not in acute distress.    Appearance: She is well-developed.  HENT:     Head: Normocephalic and atraumatic.     Mouth/Throat:     Mouth: Mucous membranes are  moist.     Pharynx: Oropharynx is clear.   Eyes:     Conjunctiva/sclera: Conjunctivae normal.    Cardiovascular:     Rate and Rhythm: Normal rate and regular rhythm.     Heart sounds: No murmur heard. Pulmonary:     Effort: Pulmonary effort is normal. No respiratory distress.     Breath sounds: Normal breath sounds.  Chest:     Comments: Right rib cage pain elicited by movement on examination table and when sitting up. Abdominal:  Palpations: Abdomen is soft. There is no mass.     Tenderness: There is no abdominal tenderness.  Lymphadenopathy:     Cervical: No cervical adenopathy.   Skin:    General: Skin is warm.     Findings: No erythema or rash.   Neurological:     General: No focal deficit present.     Mental Status: She is alert and oriented to person, place, and time.     Gait: Gait normal.   Psychiatric:        Mood and Affect: Mood and affect normal.    ASSESSMENT AND PLAN:  Ms. Riedinger was seen today for ED follow up for a fall.   Lab Results  Component Value Date   NA 141 08/01/2023   CL 107 (H) 08/01/2023   K 4.0 08/01/2023   CO2 21 08/01/2023   BUN 20 08/01/2023   CREATININE 0.54 (L) 08/01/2023   EGFR 95 08/01/2023   CALCIUM 9.3 08/01/2023   ALBUMIN 3.9 08/01/2023   GLUCOSE 94 08/01/2023   Lab Results  Component Value Date   ALT 14 08/01/2023   AST 21 08/01/2023   ALKPHOS 100 08/01/2023   BILITOT 0.3 08/01/2023   Lab Results  Component Value Date   CHOL 193 08/01/2023   HDL 66 08/01/2023   LDLCALC 104 (H) 08/01/2023   TRIG 131 08/01/2023   CHOLHDL 2.9 08/01/2023   The 10-year ASCVD risk score (Arnett DK, et al., 2019) is: 18.4%   Values used to calculate the score:     Age: 32 years     Clincally relevant sex: Female     Is Non-Hispanic African American: No     Diabetic: No     Tobacco smoker: No     Systolic Blood Pressure: 122 mmHg     Is BP treated: No     HDL Cholesterol: 66 mg/dL     Total Cholesterol: 193 mg/dL  Closed  fracture of multiple ribs of right side with routine healing, subsequent encounter Pain gradually improving. Naproxen  caused GI side effects. She agrees with trying Tramadol 50 mg, mainly at bedtime.Some side effects discussed as well as the risk for interaction with Paroxetine, which she take low dose. Continue incentive spirometry. Instructed about warning signs.  -     traMADol HCl; Take 1 tablet (50 mg total) by mouth every 12 (twelve) hours as needed for up to 5 days.  Dispense: 10 tablet; Refill: 0  Fall, subsequent encounter Fall precautions discussed. I do not think PT is needed at this time.  Hyperlipidemia, unspecified hyperlipidemia type Assessment & Plan: Continue nonpharmacologic treatment. Mild aortoiliac atherosclerotic disease seen on abdominal CT in 04/2022. Further recommendation will be given according to lipid panel results.  Orders: -     Comprehensive metabolic panel with GFR; Future -     Lipid panel; Future  Return in about 1 year (around 07/31/2024) for CPE.  I, Odelia Bender, acting as a scribe for Khori Underberg [place], MD., have documented all relevant documentation on the behalf of Gabriell Daigneault [place], MD, as directed by  Raihan Kimmel [place], MD while in the presence of Rikayla Demmon [place], MD.   I, Bentley Haralson [place], MD, have reviewed all documentation for this visit. The documentation on 08/01/23 for the exam, diagnosis, procedures, and orders are all accurate and complete.  Clair Alfieri G. [place], MD  [facility]. [place] office.

## 2023-08-02 LAB — COMPREHENSIVE METABOLIC PANEL WITH GFR
ALT: 14 IU/L (ref 0–32)
AST: 21 IU/L (ref 0–40)
Albumin: 3.9 g/dL (ref 3.8–4.8)
Alkaline Phosphatase: 100 IU/L (ref 44–121)
BUN/Creatinine Ratio: 37 — ABNORMAL HIGH (ref 12–28)
BUN: 20 mg/dL (ref 8–27)
Bilirubin Total: 0.3 mg/dL (ref 0.0–1.2)
CO2: 21 mmol/L (ref 20–29)
Calcium: 9.3 mg/dL (ref 8.7–10.3)
Chloride: 107 mmol/L — ABNORMAL HIGH (ref 96–106)
Creatinine, Ser: 0.54 mg/dL — ABNORMAL LOW (ref 0.57–1.00)
Globulin, Total: 2.1 g/dL (ref 1.5–4.5)
Glucose: 94 mg/dL (ref 70–99)
Potassium: 4 mmol/L (ref 3.5–5.2)
Sodium: 141 mmol/L (ref 134–144)
Total Protein: 6 g/dL (ref 6.0–8.5)
eGFR: 95 mL/min/{1.73_m2} (ref >59)

## 2023-08-02 LAB — LIPID PANEL
Chol/HDL Ratio: 2.9 ratio (ref 0.0–4.4)
Cholesterol, Total: 193 mg/dL (ref 100–199)
HDL: 66 mg/dL (ref >39)
LDL Chol Calc (NIH): 104 mg/dL — ABNORMAL HIGH (ref 0–99)
Triglycerides: 131 mg/dL (ref 0–149)
VLDL Cholesterol Cal: 23 mg/dL (ref 5–40)

## 2023-08-04 ENCOUNTER — Ambulatory Visit: Payer: Self-pay | Admitting: Family Medicine

## 2023-08-13 DIAGNOSIS — M5032 Other cervical disc degeneration, mid-cervical region, unspecified level: Secondary | ICD-10-CM | POA: Diagnosis not present

## 2023-08-13 DIAGNOSIS — M47812 Spondylosis without myelopathy or radiculopathy, cervical region: Secondary | ICD-10-CM | POA: Diagnosis not present

## 2023-08-13 DIAGNOSIS — M542 Cervicalgia: Secondary | ICD-10-CM | POA: Diagnosis not present

## 2023-10-04 ENCOUNTER — Other Ambulatory Visit: Payer: Self-pay | Admitting: Medical Genetics

## 2023-10-05 DIAGNOSIS — M9902 Segmental and somatic dysfunction of thoracic region: Secondary | ICD-10-CM | POA: Diagnosis not present

## 2023-10-05 DIAGNOSIS — S29012A Strain of muscle and tendon of back wall of thorax, initial encounter: Secondary | ICD-10-CM | POA: Diagnosis not present

## 2023-10-05 DIAGNOSIS — M9901 Segmental and somatic dysfunction of cervical region: Secondary | ICD-10-CM | POA: Diagnosis not present

## 2023-10-05 DIAGNOSIS — M5432 Sciatica, left side: Secondary | ICD-10-CM | POA: Diagnosis not present

## 2023-10-05 DIAGNOSIS — M47812 Spondylosis without myelopathy or radiculopathy, cervical region: Secondary | ICD-10-CM | POA: Diagnosis not present

## 2023-10-05 DIAGNOSIS — M9903 Segmental and somatic dysfunction of lumbar region: Secondary | ICD-10-CM | POA: Diagnosis not present

## 2023-10-07 DIAGNOSIS — S29012A Strain of muscle and tendon of back wall of thorax, initial encounter: Secondary | ICD-10-CM | POA: Diagnosis not present

## 2023-10-07 DIAGNOSIS — M9901 Segmental and somatic dysfunction of cervical region: Secondary | ICD-10-CM | POA: Diagnosis not present

## 2023-10-07 DIAGNOSIS — M5432 Sciatica, left side: Secondary | ICD-10-CM | POA: Diagnosis not present

## 2023-10-07 DIAGNOSIS — M47812 Spondylosis without myelopathy or radiculopathy, cervical region: Secondary | ICD-10-CM | POA: Diagnosis not present

## 2023-10-07 DIAGNOSIS — M9902 Segmental and somatic dysfunction of thoracic region: Secondary | ICD-10-CM | POA: Diagnosis not present

## 2023-10-07 DIAGNOSIS — M9903 Segmental and somatic dysfunction of lumbar region: Secondary | ICD-10-CM | POA: Diagnosis not present

## 2023-10-11 ENCOUNTER — Ambulatory Visit (INDEPENDENT_AMBULATORY_CARE_PROVIDER_SITE_OTHER): Admitting: Family Medicine

## 2023-10-11 ENCOUNTER — Encounter: Payer: Self-pay | Admitting: Family Medicine

## 2023-10-11 DIAGNOSIS — Z0000 Encounter for general adult medical examination without abnormal findings: Secondary | ICD-10-CM | POA: Diagnosis not present

## 2023-10-11 NOTE — Progress Notes (Signed)
 PATIENT CHECK-IN and HEALTH RISK ASSESSMENT QUESTIONNAIRE:  -completed by phone/video for upcoming Medicare Preventive Visit   Pre-Visit Check-in: 1)Vitals (height, wt, BP, etc) - record in vitals section for visit on day of visit Request home vitals (wt, BP, etc.) and enter into vitals, THEN update Vital Signs SmartPhrase below at the top of the HPI. See below.  2)Review and Update Medications, Allergies PMH, Surgeries, Social history in Epic 3)Hospitalizations in the last year with date/reason? No   4)Review and Update Care Team (patient's specialists) in Epic 5) Complete PHQ9 in Epic  6) Complete Fall Screening in Epic 7)Review all Health Maintenance Due and order if not done.  Medicare Wellness Patient Questionnaire:  Answer theses question about your habits: How often do you have a drink containing alcohol?2 times a week  How many drinks containing alcohol do you have on a typical day when you are drinking?2 drinks  How often do you have six or more drinks on one occasion?NO  Have you ever smoked?yes 60 years ago  Quit date if applicable? 60 years ago   How many packs a day do/did you smoke? 1/2 pack  Do you use smokeless tobacco?NO  Do you use an illicit drugs?no  On average, how many days per week do you engage in moderate to strenuous exercise (like a brisk walk 2 times a week  On average, how many minutes do you engage in exercise at this level?1.5 hours  Are you sexually active? No Number of partners?Na  Typical breakfast:  avocado toast  Typical lunch:Salad  Typical dinner:Chicken or fish  Typical snacks: fruit nuts popcorn   Beverages: coffee water   Answer theses question about your everyday activities: Can you perform most household chores?Yes  Are you deaf or have significant trouble hearing?Yes, both - has not had an audiology eval, but plans to do eventually.  Do you feel that you have a problem with memory?NO  Do you feel safe at home?Yes  Last dentist visit?  3 months ago  8. Do you have any difficulty performing your everyday activities?No  Are you having any difficulty walking, taking medications on your own, and or difficulty managing daily home needs?NO  Do you have difficulty walking or climbing stairs?NO  Do you have difficulty dressing or bathing?NO  Do you have difficulty doing errands alone such as visiting a doctor's office or shopping?No  Do you currently have any difficulty preparing food and eating?NO  Do you currently have any difficulty using the toilet?NO  Do you have any difficulty managing your finances?NO  Do you have any difficulties with housekeeping of managing your housekeeping?NO    Do you have Advanced Directives in place (Living Will, Healthcare Power or Attorney)? Yes    Last eye Exam and location?1 year, Dr. Norleen scott    Do you currently use prescribed or non-prescribed narcotic or opioid pain medications?NO   Do you have a history or close family history of breast, ovarian, tubal or peritoneal cancer or a family member with BRCA (breast cancer susceptibility 1 and 2) gene mutations?NO  Nurse/Assistant Credentials/time stamp: Leah A.Wright CMA     ----------------------------------------------------------------------------------------------------------------------------------------------------------------------------------------------------------------------  Because this visit was a virtual/telehealth visit, some criteria may be missing or patient reported. Any vitals not documented were not able to be obtained and vitals that have been documented are patient reported.    MEDICARE ANNUAL PREVENTIVE VISIT WITH PROVIDER: (Welcome to Medicare, initial annual wellness or annual wellness exam)  Virtual Visit via Video Note  I  connected with Audrey Tucker on 10/11/23 by a video enabled telemedicine application and verified that I am speaking with the correct person using two identifiers.  Location patient:  home Location provider:work or home office Persons participating in the virtual visit: patient, provider  Concerns and/or follow up today: some chronic DDD - sees [facility]. 3 months ago slipped on wet leaves and fell. Had some back pain and was evaluated - fractured a rib. Now better.    See HM section in Epic for other details of completed HM.    ROS: negative for report of fevers, unintentional weight loss, vision changes, vision loss, hearing loss or change, chest pain, sob, hemoptysis, melena, hematochezia, hematuria, falls, bleeding or bruising, thoughts of suicide or self harm, memory loss  Patient-completed extensive health risk assessment - reviewed and discussed with the patient: See Health Risk Assessment completed with patient prior to the visit either above or in recent phone note. This was reviewed in detailed with the patient today and appropriate recommendations, orders and referrals were placed as needed per Summary below and patient instructions.   Review of Medical History: -PMH, PSH, Family History and current specialty and care providers reviewed and updated and listed below   Patient Care Team: [place], Betty G, MD as PCP - General (Family Medicine)   Past Medical History:  Diagnosis Date   Adenomatous colon polyp    tubular   Arthritis    Chicken pox    Diverticulosis    Hernia, femoral    pt stated surgery in august of 2022, on left side    PONV (postoperative nausea and vomiting)    Seasonal allergies     Past Surgical History:  Procedure Laterality Date   COSMETIC SURGERY     EYE SURGERY Bilateral    cataract   FOOT NEUROMA SURGERY Right 04/29/2015   Dr Magdalen   HERNIA REPAIR     INGUINAL HERNIA REPAIR Bilateral 09/25/2020   Procedure: LAPAROSCOPIC Bilateral INGUINAL HERNIA REPAIR WITH MESH;  Surgeon: Kinsinger, Herlene Righter, MD;  Location: [facility];  Service: General;  Laterality: Bilateral;   right hand surgery      SHOULDER ARTHROSCOPY  Left     Social History   Socioeconomic History   Marital status: Married    Spouse name: Not on file   Number of children: 0   Years of education: 16   Highest education level: Associate degree: academic program  Occupational History   Occupation: works occasionally     Comment: retired   Tobacco Use   Smoking status: Former    Current packs/day: 0.00    Average packs/day: 0.5 packs/day for 2.0 years (1.0 ttl pk-yrs)    Types: Cigarettes    Quit date: 04/05/1967    Years since quitting: 56.5   Smokeless tobacco: Never   Tobacco comments:    rare use in college   Vaping Use   Vaping status: Never Used  Substance and Sexual Activity   Alcohol use: Yes    Alcohol/week: 3.0 standard drinks of alcohol    Types: 3 Glasses of wine per week    Comment: social   Drug use: No   Sexual activity: Not Currently  Other Topics Concern   Not on file  Social History Narrative   Married   No children   HH 2   Yard work, Programmer, applications   Works occasionally    Social Drivers of Corporate investment banker Strain: Low Risk  (  10/10/2023)   Overall Financial Resource Strain (CARDIA)    Difficulty of Paying Living Expenses: Not hard at all  Food Insecurity: No Food Insecurity (10/10/2023)   Hunger Vital Sign    Worried About Running Out of Food in the Last Year: Never true    Ran Out of Food in the Last Year: Never true  Transportation Needs: No Transportation Needs (10/10/2023)   PRAPARE - Administrator, Civil Service (Medical): No    Lack of Transportation (Non-Medical): No  Physical Activity: Sufficiently Active (10/10/2023)   Exercise Vital Sign    Days of Exercise per Week: 3 days    Minutes of Exercise per Session: 70 min  Stress: No Stress Concern Present (10/10/2023)   [employer] of Occupational Health - Occupational Stress Questionnaire    Feeling of Stress: Only a little  Social Connections: Unknown (10/10/2023)   Social Connection and Isolation Panel     Frequency of Communication with Friends and Family: More than three times a week    Frequency of Social Gatherings with Friends and Family: Three times a week    Attends Religious Services: 1 to 4 times per year    Active Member of Clubs or Organizations: Not on file    Attends Banker Meetings: Not on file    Marital Status: Married  Intimate Partner Violence: Not At Risk (08/05/2021)   Humiliation, Afraid, Rape, and Kick questionnaire    Fear of Current or Ex-Partner: No    Emotionally Abused: No    Physically Abused: No    Sexually Abused: No    Family History  Problem Relation Age of Onset   Arthritis Mother    Hypertension Mother    Stroke Mother    Arthritis Father    Cancer Father        lung    Current Outpatient Medications on File Prior to Visit  Medication Sig Dispense Refill   meloxicam (MOBIC) 7.5 MG tablet Take 1 tablet (7.5 mg total) by mouth daily.     lidocaine  (LIDODERM ) 5 % Place 1 patch onto the skin daily. Remove & Discard patch within 12 hours or as directed by MD (Patient not taking: Reported on 10/11/2023) 30 patch 0   methocarbamol  (ROBAXIN ) 500 MG tablet Take 1 tablet (500 mg total) by mouth 2 (two) times daily. (Patient not taking: Reported on 10/11/2023) 20 tablet 0   PARoxetine (PAXIL) 10 MG tablet Take 10 mg by mouth daily. (Patient not taking: Reported on 10/11/2023)     No current facility-administered medications on file prior to visit.    No Known Allergies     Physical Exam Vitals requested from patient and listed below if patient had equipment and was able to obtain at home for this virtual visit: There were no vitals filed for this visit. Estimated body mass index is 21.11 kg/m as calculated from the following:   Height as of 08/01/23: 5' 4 (1.626 m).   Weight as of 08/01/23: 123 lb (55.8 kg).  EKG (optional): deferred due to virtual visit  GENERAL: alert, oriented, no acute distress detected, full vision exam deferred due  to pandemic and/or virtual encounter  HEENT: atraumatic, conjunttiva clear, no obvious abnormalities on inspection of external nose and ears  NECK: normal movements of the head and neck  LUNGS: on inspection no signs of respiratory distress, breathing rate appears normal, no obvious gross SOB, gasping or wheezing  CV: no obvious cyanosis  MS: moves all visible  extremities without noticeable abnormality  PSYCH/NEURO: pleasant and cooperative, no obvious depression or anxiety, speech and thought processing grossly intact, Cognitive function grossly intact  Flowsheet Row Clinical Support from 10/11/2023 in [facility] [place]  PHQ-9 Total Score 2        10/11/2023   10:00 AM 08/26/2022   10:45 AM 06/16/2022    2:19 PM 08/05/2021    1:58 PM 07/23/2020    8:48 AM  Depression screen PHQ 2/9  Decreased Interest 0 0 0 0 0  Down, Depressed, Hopeless 0 0 0 0 0  PHQ - 2 Score 0 0 0 0 0  Altered sleeping 0      Tired, decreased energy 0      Change in appetite 0      Feeling bad or failure about yourself  0      Trouble concentrating 2      Moving slowly or fidgety/restless 0      Suicidal thoughts 0      PHQ-9 Score 2           08/03/2022    1:24 PM 08/08/2022   11:36 AM 08/26/2022   10:44 AM 10/10/2023    2:54 PM 10/11/2023   10:00 AM  Fall Risk  Falls in the past year? 0 0 0 1 1  Was there an injury with Fall? 0 0 0 1 1  Fall Risk Category Calculator 0   0 0 0 2  2  Patient at Risk for Falls Due to   No Fall Risks    Fall risk Follow up     Falls evaluation completed     Patient-reported   Slipped on leaves - freak fall. Feels is not a fall risk.   SUMMARY AND PLAN:  Encounter for Medicare annual wellness exam   Discussed applicable health maintenance/preventive health measures and advised and referred or ordered per patient preferences: -she says she will be doing the bone density test in October with her gyn doc -discussed vaccines and she knows  can get at the pharmacy Health Maintenance  Topic Date Due   COVID-19 Vaccine (5 - 2024-25 season) 10/27/2023 (Originally 10/17/2022)   Zoster Vaccines- Shingrix (1 of 2) 01/11/2024 (Originally 10/23/1995)   INFLUENZA VACCINE  12/15/2028 (Originally 09/16/2023)   DTaP/Tdap/Td (2 - Td or Tdap) 02/16/2024   Medicare Annual Wellness (AWV)  10/10/2024   Colonoscopy  04/29/2027   Pneumococcal Vaccine: 50+ Years  Completed   DEXA SCAN  Completed   Hepatitis C Screening  Completed   HPV VACCINES  Aged Out   Meningococcal B Vaccine  Aged Out      Education and counseling on the following was provided based on the above review of health and a plan/checklist for the patient, along with additional information discussed, was provided for the patient in the patient instructions :   -Provided counseling and plan for difficulty hearing, advised on options for audiology eval and she is considering -Provided counseling and plan for increased risk of falling if applicable per above screening. Reviewed and demonstrated safe balance exercises that can be done at home to improve balance and discussed exercise guidelines for adults with include balance exercises at least 3 days per week.  -Advised and counseled on a healthy lifestyle  -Reviewed patient's current diet. Advised and counseled on a whole foods based healthy diet. A summary of a healthy diet was provided in the Patient Instructions.  -reviewed patient's current physical activity level and  discussed exercise guidelines for adults. Discussed community resources and ideas for safe exercise at home to assist in meeting exercise guideline recommendations in a safe and healthy way. Specifically discussed also gentle postural strengthening exercises to support neck and back.  -Advise yearly dental visits at minimum and regular eye exams -Advised and counseled on alcohol safe limits  Follow up: see patient instructions     Patient Instructions  I really  enjoyed getting to talk with you today! I am available on Tuesdays and Thursdays for virtual visits if you have any questions or concerns, or if I can be of any further assistance.   CHECKLIST FROM ANNUAL WELLNESS VISIT:  -Follow up (please call to schedule if not scheduled after visit):   -yearly for annual wellness visit with primary care office  Here is a list of your preventive care/health maintenance measures and the plan for each if any are due:  PLAN For any measures below that may be due:    1. Please send copy of bond density report to Dr. Sw[place]il[place].   2. If you get any vaccines at the pharmacy, please let us  know so that we can update your record.   Health Maintenance  Topic Date Due   COVID-19 Vaccine (5 - 2024-25 season) 10/27/2023 (Originally 10/17/2022)   Zoster Vaccines- Shingrix (1 of 2) 01/11/2024 (Originally 10/23/1995)   INFLUENZA VACCINE  12/15/2028 (Originally 09/16/2023)   DTaP/Tdap/Td (2 - Td or Tdap) 02/16/2024   Medicare Annual Wellness (AWV)  10/10/2024   Colonoscopy  04/29/2027   Pneumococcal Vaccine: 50+ Years  Completed   DEXA SCAN  Completed   Hepatitis C Screening  Completed   HPV VACCINES  Aged Out   Meningococcal B Vaccine  Aged Out    -See a dentist at least yearly  -Get your eyes checked and then per your eye specialist's recommendations  -Other issues addressed today:   -I have included below further information regarding a healthy whole foods based diet, physical activity guidelines for adults, stress management and opportunities for social connections. I hope you find this information useful.   -----------------------------------------------------------------------------------------------------------------------------------------------------------------------------------------------------------------------------------------------------------    NUTRITION: -eat real food: lots of colorful vegetables (half the plate) and fruits -5-7 servings of  vegetables and fruits per day (fresh or steamed is best), exp. 2 servings of vegetables with lunch and dinner and 2 servings of fruit per day. Berries and greens such as kale and collards are great choices.  -consume on a regular basis:  fresh fruits, fresh veggies, fish, nuts, seeds, healthy oils (such as olive oil, avocado oil), whole grains (make sure for bread/pasta/crackers/etc., that the first ingredient on label contains the word whole), legumes. -can eat small amounts of dairy and lean meat (no larger than the palm of your hand), but avoid processed meats such as ham, bacon, lunch meat, etc. -drink water -try to avoid fast food and pre-packaged foods, processed meat, ultra processed foods/beverages (donuts, candy, etc.) -most experts advise limiting sodium to < 2300mg  per day, should limit further is any chronic conditions such as high blood pressure, heart disease, diabetes, etc. The American Heart Association advised that < 1500mg  is is ideal -try to avoid foods/beverages that contain any ingredients with names you do not recognize  -try to avoid foods/beverages  with added sugar or sweeteners/sweets  -try to avoid sweet drinks (including diet drinks): soda, juice, Gatorade, sweet tea, power drinks, diet drinks -try to avoid white rice, white bread, pasta (unless whole grain)  EXERCISE GUIDELINES FOR ADULTS: -  if you wish to increase your physical activity, do so gradually and with the approval of your doctor -STOP and seek medical care immediately if you have any chest pain, chest discomfort or trouble breathing when starting or increasing exercise  -move and stretch your body, legs, feet and arms when sitting for long periods -Physical activity guidelines for optimal health in adults: -get at least 150 minutes per week of moderate exercise (can talk, but not sing); this is about 20-30 minutes of sustained activity 5-7 days per week or two 10-15 minute episodes of sustained activity 5-7  days per week -do some muscle building/resistance training/strength training at least 2 days per week  -balance exercises 3+ days per week:   Stand somewhere where you have something sturdy to hold onto if you lose balance    1) lift up on toes, then back down, start with 5x per day and work up to 20x   2) stand and lift one leg straight out to the side so that foot is a few inches of the floor, start with 5x each side and work up to 20x each side   3) stand on one foot, start with 5 seconds each side and work up to 20 seconds on each side  If you need ideas or help with getting more active:  -Silver sneakers https://tools.silversneakers.com  -Walk with a Doc: http://www.duncan-williams.com/  -try to include resistance (weight lifting/strength building) and balance exercises twice per week: or the following link for ideas: http://castillo-powell.com/  BuyDucts.dk  STRESS MANAGEMENT: -can try meditating, or just sitting quietly with deep breathing while intentionally relaxing all parts of your body for 5 minutes daily -if you need further help with stress, anxiety or depression please follow up with your primary doctor or contact the wonderful folks at [facility]: [phone]  SOCIAL CONNECTIONS: -options in [place] if you wish to engage in more social and exercise related activities:  -Silver sneakers https://tools.silversneakers.com  -Walk with a Doc: http://www.duncan-williams.com/  -Check out the [facility] Active Adults 50+ section on the [place] of [employer] (hiking clubs, book clubs, cards and games, chess, exercise classes, aquatic classes and much more) - see the website for details: https://www.[place]-.gov/departments/parks-recreation/active-adults50  -YouTube has lots of exercise videos for different ages and abilities as well  -[facility] (a  variety of indoor and outdoor inperson activities for adults). ([phone]) [phone]. [address]  -Virtual Online Classes (a variety of topics): see seniorplanet.org or call ([phone])180-0595  -consider volunteering at a school, hospice center, church, senior center or elsewhere            Chiquita JONELLE Cramp, DO

## 2023-10-11 NOTE — Progress Notes (Signed)
 Patient was unable to self-report due to a lack of equipment at home via telehealth

## 2023-10-11 NOTE — Patient Instructions (Signed)
 I really enjoyed getting to talk with you today! I am available on Tuesdays and Thursdays for virtual visits if you have any questions or concerns, or if I can be of any further assistance.   CHECKLIST FROM ANNUAL WELLNESS VISIT:  -Follow up (please call to schedule if not scheduled after visit):   -yearly for annual wellness visit with primary care office  Here is a list of your preventive care/health maintenance measures and the plan for each if any are due:  PLAN For any measures below that may be due:    1. Please send copy of bond density report to Dr. Swaziland.   2. If you get any vaccines at the pharmacy, please let us  know so that we can update your record.   Health Maintenance  Topic Date Due   COVID-19 Vaccine (5 - 2024-25 season) 10/27/2023 (Originally 10/17/2022)   Zoster Vaccines- Shingrix (1 of 2) 01/11/2024 (Originally 10/23/1995)   INFLUENZA VACCINE  12/15/2028 (Originally 09/16/2023)   DTaP/Tdap/Td (2 - Td or Tdap) 02/16/2024   Medicare Annual Wellness (AWV)  10/10/2024   Colonoscopy  04/29/2027   Pneumococcal Vaccine: 50+ Years  Completed   DEXA SCAN  Completed   Hepatitis C Screening  Completed   HPV VACCINES  Aged Out   Meningococcal B Vaccine  Aged Out    -See a dentist at least yearly  -Get your eyes checked and then per your eye specialist's recommendations  -Other issues addressed today:   -I have included below further information regarding a healthy whole foods based diet, physical activity guidelines for adults, stress management and opportunities for social connections. I hope you find this information useful.   -----------------------------------------------------------------------------------------------------------------------------------------------------------------------------------------------------------------------------------------------------------    NUTRITION: -eat real food: lots of colorful vegetables (half the plate) and fruits -5-7  servings of vegetables and fruits per day (fresh or steamed is best), exp. 2 servings of vegetables with lunch and dinner and 2 servings of fruit per day. Berries and greens such as kale and collards are great choices.  -consume on a regular basis:  fresh fruits, fresh veggies, fish, nuts, seeds, healthy oils (such as olive oil, avocado oil), whole grains (make sure for bread/pasta/crackers/etc., that the first ingredient on label contains the word whole), legumes. -can eat small amounts of dairy and lean meat (no larger than the palm of your hand), but avoid processed meats such as ham, bacon, lunch meat, etc. -drink water -try to avoid fast food and pre-packaged foods, processed meat, ultra processed foods/beverages (donuts, candy, etc.) -most experts advise limiting sodium to < 2300mg  per day, should limit further is any chronic conditions such as high blood pressure, heart disease, diabetes, etc. The American Heart Association advised that < 1500mg  is is ideal -try to avoid foods/beverages that contain any ingredients with names you do not recognize  -try to avoid foods/beverages  with added sugar or sweeteners/sweets  -try to avoid sweet drinks (including diet drinks): soda, juice, Gatorade, sweet tea, power drinks, diet drinks -try to avoid white rice, white bread, pasta (unless whole grain)  EXERCISE GUIDELINES FOR ADULTS: -if you wish to increase your physical activity, do so gradually and with the approval of your doctor -STOP and seek medical care immediately if you have any chest pain, chest discomfort or trouble breathing when starting or increasing exercise  -move and stretch your body, legs, feet and arms when sitting for long periods -Physical activity guidelines for optimal health in adults: -get at least 150 minutes per week of moderate  exercise (can talk, but not sing); this is about 20-30 minutes of sustained activity 5-7 days per week or two 10-15 minute episodes of sustained  activity 5-7 days per week -do some muscle building/resistance training/strength training at least 2 days per week  -balance exercises 3+ days per week:   Stand somewhere where you have something sturdy to hold onto if you lose balance    1) lift up on toes, then back down, start with 5x per day and work up to 20x   2) stand and lift one leg straight out to the side so that foot is a few inches of the floor, start with 5x each side and work up to 20x each side   3) stand on one foot, start with 5 seconds each side and work up to 20 seconds on each side  If you need ideas or help with getting more active:  -Silver sneakers https://tools.silversneakers.com  -Walk with a Doc: http://www.duncan-williams.com/  -try to include resistance (weight lifting/strength building) and balance exercises twice per week: or the following link for ideas: http://castillo-powell.com/  BuyDucts.dk  STRESS MANAGEMENT: -can try meditating, or just sitting quietly with deep breathing while intentionally relaxing all parts of your body for 5 minutes daily -if you need further help with stress, anxiety or depression please follow up with your primary doctor or contact the wonderful folks at [facility]: [phone]  SOCIAL CONNECTIONS: -options in [place] if you wish to engage in more social and exercise related activities:  -Silver sneakers https://tools.silversneakers.com  -Walk with a Doc: http://www.duncan-williams.com/  -Check out the [facility] Active Adults 50+ section on the [place] [employer] (hiking clubs, book clubs, cards and games, chess, exercise classes, aquatic classes and much more) - see the website for details: https://www.[place].gov/departments/parks-recreation/active-adults50  -YouTube has lots of exercise videos for different ages and abilities as well  -[facility] (a variety of indoor and outdoor inperson activities for adults). [phone]. [address].  -Virtual Online Classes (a variety of topics): see seniorplanet.org or call [phone]  -consider volunteering at a school, hospice center, church, senior center or elsewhere

## 2023-10-14 DIAGNOSIS — S29012A Strain of muscle and tendon of back wall of thorax, initial encounter: Secondary | ICD-10-CM | POA: Diagnosis not present

## 2023-10-14 DIAGNOSIS — M9903 Segmental and somatic dysfunction of lumbar region: Secondary | ICD-10-CM | POA: Diagnosis not present

## 2023-10-14 DIAGNOSIS — M5432 Sciatica, left side: Secondary | ICD-10-CM | POA: Diagnosis not present

## 2023-10-14 DIAGNOSIS — M47812 Spondylosis without myelopathy or radiculopathy, cervical region: Secondary | ICD-10-CM | POA: Diagnosis not present

## 2023-10-14 DIAGNOSIS — M9901 Segmental and somatic dysfunction of cervical region: Secondary | ICD-10-CM | POA: Diagnosis not present

## 2023-10-14 DIAGNOSIS — M9902 Segmental and somatic dysfunction of thoracic region: Secondary | ICD-10-CM | POA: Diagnosis not present

## 2023-10-19 DIAGNOSIS — M9901 Segmental and somatic dysfunction of cervical region: Secondary | ICD-10-CM | POA: Diagnosis not present

## 2023-10-19 DIAGNOSIS — M9903 Segmental and somatic dysfunction of lumbar region: Secondary | ICD-10-CM | POA: Diagnosis not present

## 2023-10-19 DIAGNOSIS — S29012A Strain of muscle and tendon of back wall of thorax, initial encounter: Secondary | ICD-10-CM | POA: Diagnosis not present

## 2023-10-19 DIAGNOSIS — M47812 Spondylosis without myelopathy or radiculopathy, cervical region: Secondary | ICD-10-CM | POA: Diagnosis not present

## 2023-10-19 DIAGNOSIS — M5432 Sciatica, left side: Secondary | ICD-10-CM | POA: Diagnosis not present

## 2023-10-19 DIAGNOSIS — M9902 Segmental and somatic dysfunction of thoracic region: Secondary | ICD-10-CM | POA: Diagnosis not present

## 2023-10-31 ENCOUNTER — Ambulatory Visit: Admitting: Orthopaedic Surgery

## 2023-10-31 ENCOUNTER — Encounter: Payer: Self-pay | Admitting: Orthopaedic Surgery

## 2023-10-31 DIAGNOSIS — M5442 Lumbago with sciatica, left side: Secondary | ICD-10-CM | POA: Diagnosis not present

## 2023-10-31 DIAGNOSIS — M7062 Trochanteric bursitis, left hip: Secondary | ICD-10-CM

## 2023-10-31 MED ORDER — LIDOCAINE HCL 1 % IJ SOLN
3.0000 mL | INTRAMUSCULAR | Status: AC | PRN
  Administered 2023-10-31: 3 mL

## 2023-10-31 MED ORDER — METHYLPREDNISOLONE ACETATE 40 MG/ML IJ SUSP
40.0000 mg | INTRAMUSCULAR | Status: AC | PRN
  Administered 2023-10-31: 40 mg via INTRA_ARTICULAR

## 2023-10-31 NOTE — Progress Notes (Signed)
 The patient is well-known to us .  We have seen her from time to time for left hip trochanteric bursitis.  It has been a long period time since she has had physical therapy on her hip.  We last injected the left trochanteric area about 4 months ago.  She is requesting another injection today.  However she has been having some sciatic type symptoms on the left side when she gets up in the morning and it radiates down the back of her knee and has been going on for about a month now.  She has seen a spine specialist for her cervical spine at [facility] and they have done injections and MRI and therapy and she feels like she needs to see a spine specialist as well at some point.  I did recommend that she reach out to the spine specialist at her other practice since they have all of her studies and can go from there as far as the neck.  On exam she does have a positive straight leg raise on the left side and pain in the sciatic region.  Her left hip moves smoothly including with pain over the trochanteric area.  I did provide a steroid injection of her left hip trochanteric area which she tolerated well.  At this point we will send her to outpatient physical therapy at [facility] for any modalities that can help her left sided sciatica and left hip trochanteric bursitis.  Will then see her back in the course of 6 weeks.  If she is continue to have sciatic symptoms we will need to AP and lateral of the lumbar spine.  She agrees with this treatment plan and did tolerate her injection well.    Procedure Note  Patient: Audrey Tucker             Date of Birth: 1945-09-25           MRN: [id number]             Visit Date: 10/31/2023  Procedures: Visit Diagnoses:  1. Trochanteric bursitis, left hip   2. Acute left-sided low back pain with left-sided sciatica     Large Joint Inj: L greater trochanter on 10/31/2023 9:43 AM Indications: pain and diagnostic evaluation Details: 22 G 1.5  in needle, lateral approach  Arthrogram: No  Medications: 3 mL lidocaine  1 %; 40 mg methylPREDNISolone  acetate 40 MG/ML Outcome: tolerated well, no immediate complications Procedure, treatment alternatives, risks and benefits explained, specific risks discussed. Consent was given by the patient. Immediately prior to procedure a time out was called to verify the correct patient, procedure, equipment, support staff and site/side marked as required. Patient was prepped and draped in the usual sterile fashion.

## 2023-11-01 ENCOUNTER — Other Ambulatory Visit: Payer: Self-pay

## 2023-11-01 DIAGNOSIS — M7062 Trochanteric bursitis, left hip: Secondary | ICD-10-CM

## 2023-11-04 DIAGNOSIS — M9902 Segmental and somatic dysfunction of thoracic region: Secondary | ICD-10-CM | POA: Diagnosis not present

## 2023-11-04 DIAGNOSIS — M9903 Segmental and somatic dysfunction of lumbar region: Secondary | ICD-10-CM | POA: Diagnosis not present

## 2023-11-04 DIAGNOSIS — M47812 Spondylosis without myelopathy or radiculopathy, cervical region: Secondary | ICD-10-CM | POA: Diagnosis not present

## 2023-11-04 DIAGNOSIS — M9901 Segmental and somatic dysfunction of cervical region: Secondary | ICD-10-CM | POA: Diagnosis not present

## 2023-11-04 DIAGNOSIS — S29012A Strain of muscle and tendon of back wall of thorax, initial encounter: Secondary | ICD-10-CM | POA: Diagnosis not present

## 2023-11-04 DIAGNOSIS — M5432 Sciatica, left side: Secondary | ICD-10-CM | POA: Diagnosis not present

## 2023-11-21 ENCOUNTER — Ambulatory Visit: Admitting: Orthopaedic Surgery

## 2023-11-21 ENCOUNTER — Encounter: Payer: Self-pay | Admitting: Orthopaedic Surgery

## 2023-11-21 DIAGNOSIS — M7061 Trochanteric bursitis, right hip: Secondary | ICD-10-CM

## 2023-11-21 MED ORDER — LIDOCAINE HCL 1 % IJ SOLN
3.0000 mL | INTRAMUSCULAR | Status: AC | PRN
  Administered 2023-11-21: 3 mL

## 2023-11-21 MED ORDER — METHYLPREDNISOLONE ACETATE 40 MG/ML IJ SUSP
40.0000 mg | INTRAMUSCULAR | Status: AC | PRN
  Administered 2023-11-21: 40 mg via INTRA_ARTICULAR

## 2023-11-21 NOTE — Progress Notes (Signed)
 The patient is well-known to us .  She is 78 years old and actually is much younger appearing than her stated age.  Most recently saw her for left hip trochanteric bursitis and did place a steroid injection in her left hip just on 15 September and that is doing well.  She comes in today requesting 1 for her right side.  She is also starting physical therapy soon on her lumbar spine.  There is pain to palpation over the trochanteric area of her right hip with good range of motion of that hip in general.  Previous x-rays were not normal of the hip in terms of the hip joint space and no cortical irregularities around the trochanteric area.  Per her request we did place a steroid injection of the right hip trochanteric area which she tolerated well.  All questions and concerns were addressed and answered.  Follow-up to be as needed.    Procedure Note  Patient: Audrey Tucker             Date of Birth: [birth year]           MRN: [id number]             Visit Date: 11/21/2023  Procedures: Visit Diagnoses:  1. Trochanteric bursitis, right hip     Large Joint Inj: R greater trochanter on 11/21/2023 8:48 AM Indications: pain and diagnostic evaluation Details: 22 G 1.5 in needle, lateral approach  Arthrogram: No  Medications: 3 mL lidocaine  1 %; 40 mg methylPREDNISolone  acetate 40 MG/ML Outcome: tolerated well, no immediate complications Procedure, treatment alternatives, risks and benefits explained, specific risks discussed. Consent was given by the patient. Immediately prior to procedure a time out was called to verify the correct patient, procedure, equipment, support staff and site/side marked as required. Patient was prepped and draped in the usual sterile fashion.

## 2023-11-23 ENCOUNTER — Other Ambulatory Visit (HOSPITAL_COMMUNITY)
Admission: RE | Admit: 2023-11-23 | Discharge: 2023-11-23 | Disposition: A | Discharge status: Home / Self Care | Payer: Self-pay | Source: Ambulatory Visit | Attending: Medical Genetics | Admitting: Medical Genetics

## 2023-12-01 DIAGNOSIS — M8588 Other specified disorders of bone density and structure, other site: Secondary | ICD-10-CM | POA: Diagnosis not present

## 2023-12-03 LAB — GENECONNECT MOLECULAR SCREEN: Genetic Analysis Overall Interpretation: NEGATIVE

## 2023-12-06 ENCOUNTER — Other Ambulatory Visit: Payer: Self-pay

## 2023-12-06 ENCOUNTER — Ambulatory Visit (HOSPITAL_BASED_OUTPATIENT_CLINIC_OR_DEPARTMENT_OTHER): Attending: Orthopaedic Surgery | Admitting: Physical Therapy

## 2023-12-06 DIAGNOSIS — M25552 Pain in left hip: Secondary | ICD-10-CM | POA: Diagnosis not present

## 2023-12-06 DIAGNOSIS — M7062 Trochanteric bursitis, left hip: Secondary | ICD-10-CM | POA: Insufficient documentation

## 2023-12-06 DIAGNOSIS — M5459 Other low back pain: Secondary | ICD-10-CM | POA: Insufficient documentation

## 2023-12-06 DIAGNOSIS — M25551 Pain in right hip: Secondary | ICD-10-CM | POA: Insufficient documentation

## 2023-12-06 NOTE — Therapy (Signed)
 OUTPATIENT PHYSICAL THERAPY LOWER EXTREMITY EVALUATION   Patient Name: Audrey Tucker MRN: [id number] DOB:01-Sep-1945, 78 y.o., female Today's Date: 12/07/2023  END OF SESSION:  PT End of Session - 12/07/23 1238     Visit Number 1    Number of Visits 16    Date for Recertification  02/01/24    PT Start Time 0800    PT Stop Time 0844    PT Time Calculation (min) 44 min    Activity Tolerance Patient tolerated treatment well    Behavior During Therapy [facility] for tasks assessed/performed          Past Medical History:  Diagnosis Date   Adenomatous colon polyp    tubular   Arthritis    Chicken pox    Diverticulosis    Hernia, femoral    pt stated surgery in august of 2022, on left side    PONV (postoperative nausea and vomiting)    Seasonal allergies    Past Surgical History:  Procedure Laterality Date   COSMETIC SURGERY     EYE SURGERY Bilateral    cataract   FOOT NEUROMA SURGERY Right 04/29/2015   Dr Magdalen   HERNIA REPAIR     INGUINAL HERNIA REPAIR Bilateral 09/25/2020   Procedure: LAPAROSCOPIC Bilateral INGUINAL HERNIA REPAIR WITH MESH;  Surgeon: Kinsinger, Herlene Righter, MD;  Location: [facility];  Service: General;  Laterality: Bilateral;   right hand surgery      SHOULDER ARTHROSCOPY Left    Patient Active Problem List   Diagnosis Date Noted   Hyperlipidemia 08/01/2023   Chronic neck pain 03/23/2023   Hot flash not due to menopause 03/23/2023   Apical lung scarring 06/16/2022   Abdominal aortic atherosclerosis 05/16/2020   Insomnia 05/16/2020   Seasonal allergies 05/16/2020   RLS (restless legs syndrome) 12/31/2016   Trochanteric bursitis of both hips 08/09/2016   Generalized osteoarthritis of hand 07/27/2016   Headache, variant migraine 07/26/2016    PCP: Betty Swaziland MD   REFERRING PROVIDER: Dr Lonni Poli   REFERRING DIAG:  Diagnosis  M70.62 (ICD-10-CM) - Trochanteric bursitis, left hip   THERAPY DIAG:  Pain in left hip  Pain in right  hip  Other low back pain  Rationale for Evaluation and Treatment: Rehabilitation  ONSET DATE:  Long standing pain   SUBJECTIVE:   SUBJECTIVE STATEMENT: Patient has long history of bilateral hip pain.  At different x 1 hip has been worse than the other.  At this time her left hip is worse.  She has increased pain in the morning.  She also has numbness in her foot.  She has not had her back looked at but is has a history of degenerative disc disease in her neck.  When she moves in the morning the pain tends to decrease and the numbness in her foot tends to decrease.  She is generally active.  PERTINENT HISTORY: Right sided neuroma removal, left shoulder scope,  PAIN:  Are you having pain? Yes: NPRS scale: 1-2 right now  Pain location: down the back of the left leg  Pain description: sharp  Aggravating factors:  Relieving factors: stretching  PRECAUTIONS: None  RED FLAGS: None   WEIGHT BEARING RESTRICTIONS: No  FALLS:  Has patient fallen in last 6 months? Yes. Number of falls 1  Slipped on wet leaves fell on tailbone.   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   PLOF:  Independent  PATIENT GOALS:  To have less pain   NEXT MD VISIT:  Sees the MD next week   OBJECTIVE:  Note: Objective measures were completed at Evaluation unless otherwise noted.  DIAGNOSTIC FINDINGS:  Bilateral hips:  An AP pelvis lateral left hip shows well located hips. There is no  significant arthritis hip joint at all. There is no cortical  irregularities around either trochanteric area.   PATIENT SURVEYS:  LEFS  Extreme difficulty/unable (0), Quite a bit of difficulty (1), Moderate difficulty (2), Little difficulty (3), No difficulty (4) Survey date:    Any of your usual work, housework or school activities   2. Usual hobbies, recreational or sporting activities   3. Getting into/out of the bath   4. Walking  between rooms   5. Putting on socks/shoes   6. Squatting    7. Lifting an object, like a bag of groceries from the floor   8. Performing light activities around your home   9. Performing heavy activities around your home   10. Getting into/out of a car   11. Walking 2 blocks   12. Walking 1 mile   13. Going up/down 10 stairs (1 flight)   14. Standing for 1 hour   15.  sitting for 1 hour   16. Running on even ground   17. Running on uneven ground   18. Making sharp turns while running fast   19. Hopping    20. Rolling over in bed   Score total:  45     COGNITION: Overall cognitive status: Within functional limits for tasks assessed     SENSATION: Numbness in the left foot. Comes and goes    POSTURE: No Significant postural limitations  PALPATION: Spasming in bilateral lumbar paraspinals/ lateral hip area and into bilateral IT bands   LOWER EXTREMITY ROM:  Passive ROM Right eval Left eval  Hip flexion 125 125  Hip extension    Hip abduction    Hip adduction    Hip internal rotation WNL WNL  Hip external rotation WNL WNL  Knee flexion    Knee extension    Ankle dorsiflexion    Ankle plantarflexion    Ankle inversion    Ankle eversion     (Blank rows = not tested) hyper mobility in hips   LOWER EXTREMITY MMT:  MMT Right eval Left eval  Hip flexion 23.6 21.8  Hip extension    Hip abduction 18.6 26.1  Hip adduction    Hip internal rotation    Hip external rotation    Knee flexion    Knee extension 39.0 40.8  Ankle dorsiflexion    Ankle plantarflexion    Ankle inversion    Ankle eversion     (Blank rows = not tested)  LUMBAR ROM:   Active  A/PROM  eval  Flexion Pulling in right hnastring but full   Extension Nothing   Right lateral flexion   Left lateral flexion   Right rotation Sensation in mid back   Left rotation Pulling in left leg    (Blank rows = not tested)     FUNCTIONAL TESTS:  Single leg stance right 15 seconds Left can't  maintain single leg stance   GAIT: Mild lateral movement bilateral  TREATMENT DATE:  Manual:  Soft tissue mobilization to lumbar spine and upper gluteal   Neuro-re-ed    PATIENT EDUCATION:  Education details: HEP, symptom management Person educated: Patient Education method: Explanation, Demonstration, Tactile cues, Verbal cues, and Handouts Education comprehension: verbalized understanding, returned demonstration, verbal cues required, tactile cues required, and needs further education  HOME EXERCISE PROGRAM: Access Code: [email or web address] URL: [email or web address] Date: 12/07/2023 Prepared by: Alm Don  Exercises - Supine Piriformis Stretch with Foot on Ground  - 1 x daily - 7 x weekly - 3 sets - 10 reps - Standing Glute Med Mobilization with Small Ball on Wall  - 1 x daily - 7 x weekly - 3 sets - 10 reps - Supine Bridge  - 1 x daily - 7 x weekly - 3 sets - 10 reps - Supine March  - 1 x daily - 7 x weekly - 3 sets - 10 reps - Hooklying Clamshell with Resistance  - 1 x daily - 7 x weekly - 3 sets - 10 reps - Supine March  - 1 x daily - 7 x weekly - 3 sets - 10 reps  ASSESSMENT:  CLINICAL IMPRESSION: Patient is a 79 year old female who presents with bilateral hip pain left greater than right.  She also has pain that radiates down the back of her leg and numbness in her foot.  Both these issues are worse in the morning.  She presents with bilateral gluteal weakness.  She has significant spasming in her gluteal and lower lumbar spine particularly on the left side.  She has increased pain with left lumbar rotation.  She has full hip mobility.  She is likely hypermobile in her hips.  She would benefit from a program that focuses on hip stability and decreasing spasming in her lumbar spine and upper gluteal area. OBJECTIVE IMPAIRMENTS: Abnormal gait,  decreased activity tolerance, decreased endurance, decreased mobility, difficulty walking, decreased ROM, decreased strength, and pain.   ACTIVITY LIMITATIONS: carrying, standing, squatting, stairs, and locomotion level  PARTICIPATION LIMITATIONS: cleaning, laundry, shopping, and community activity  PERSONAL FACTORS: Age are also affecting patient's functional outcome. Time of onset.   REHAB POTENTIAL: Good  CLINICAL DECISION MAKING: Evolving/moderate complexity increasing pain levels   EVALUATION COMPLEXITY: Moderate   GOALS: Goals reviewed with patient? Yes  SHORT TERM GOALS: Target date: 01/04/2024   Patient will increase bilateral hip strength by 5 lbs  Baseline: Goal status: INITIAL  2.  Patient will report a 50% reduction in pain and numbness in her foot Baseline:  Goal status: INITIAL  3.  Patient will  Baseline:  Goal status: INITIAL   LONG TERM GOALS: Target date: 02/01/2024    Patient will go up.dwon 12 steps without pain  Baseline:  Goal status: INITIAL  2.  Patient will be independent with full HEP for strength and stability  Baseline:  Goal status: INITIAL  3.  Patent will ambulate community distances without pain  Baseline:  Goal status: INITIAL     PLAN:  PT FREQUENCY: 2x/week  PT DURATION: 8 weeks  PLANNED INTERVENTIONS: [id number]- PT Re-evaluation, 97110-Therapeutic exercises, 97530- Therapeutic activity, 97112- Neuromuscular re-education, 97535- Self Care, [id number]- Manual therapy, [id number]- Gait training, [phone]- Aquatic Therapy, [phone]- Electrical stimulation (unattended), Patient/Family education, Joint manipulation, Spinal mobilization, Cryotherapy, and Moist heat  PLAN FOR NEXT SESSION:  Begin with manual therapy to bilateral hips and lumbar spine.  Seems to be more of a stability issue.  Reviewed current exercises.  Progressed to standing exercises tolerated.  Consider  gym exercises.  Discussed how to use TA breathing with gym  exercises.   Alm JINNY Don, PT 12/07/2023, 12:40 PM

## 2023-12-07 ENCOUNTER — Encounter (HOSPITAL_BASED_OUTPATIENT_CLINIC_OR_DEPARTMENT_OTHER): Payer: Self-pay | Admitting: Physical Therapy

## 2023-12-12 ENCOUNTER — Ambulatory Visit: Admitting: Orthopaedic Surgery

## 2023-12-12 ENCOUNTER — Encounter: Payer: Self-pay | Admitting: Orthopaedic Surgery

## 2023-12-12 DIAGNOSIS — M7061 Trochanteric bursitis, right hip: Secondary | ICD-10-CM

## 2023-12-12 DIAGNOSIS — M7062 Trochanteric bursitis, left hip: Secondary | ICD-10-CM

## 2023-12-12 NOTE — Progress Notes (Signed)
 The patient is a 78 year old female well-known to us .  She has pain over the lateral aspect of both of her hips more so on the left than the right but the right feels more like a muscle type pain.  She is incredibly active and thin.  She has had 1 physical therapy evaluation and does have 3 upcoming physical therapy visits.  She has had a steroid injection in both hip trochanteric areas and neither of these injections have helped.  X-rays of her pelvis and hips have been normal appearing.  On exam today again she walks with no significant limp except when she first gets up he can tell she is favoring her left hip.  And she walks it off easily.  Both hips move smoothly including no blocks or rotation and no pain in the groin at all.  Her left hip definitely has pain over the trochanteric area and the proximal IT band.  The right hip seems to be more in the tensor fascia muscle and the proximal muscles.  At this point she will continue physical therapy and we do need to obtain an MRI of both hips to assess the gluteus medius and minimus tendons as well as any other potential pathology that is causing her to have this type of pain around both hips.  Will then see her back in follow-up.  She agrees with this treatment plan.  She will continue physical therapy in the interim.

## 2023-12-13 ENCOUNTER — Other Ambulatory Visit: Payer: Self-pay

## 2023-12-13 DIAGNOSIS — M7061 Trochanteric bursitis, right hip: Secondary | ICD-10-CM

## 2023-12-13 DIAGNOSIS — M7062 Trochanteric bursitis, left hip: Secondary | ICD-10-CM

## 2023-12-14 ENCOUNTER — Encounter (HOSPITAL_BASED_OUTPATIENT_CLINIC_OR_DEPARTMENT_OTHER): Admitting: Physical Therapy

## 2023-12-19 ENCOUNTER — Encounter: Payer: Self-pay | Admitting: Radiology

## 2023-12-19 ENCOUNTER — Encounter (HOSPITAL_BASED_OUTPATIENT_CLINIC_OR_DEPARTMENT_OTHER): Payer: Self-pay | Admitting: Physical Therapy

## 2023-12-19 ENCOUNTER — Ambulatory Visit (HOSPITAL_BASED_OUTPATIENT_CLINIC_OR_DEPARTMENT_OTHER): Attending: Orthopaedic Surgery | Admitting: Physical Therapy

## 2023-12-19 DIAGNOSIS — M25551 Pain in right hip: Secondary | ICD-10-CM | POA: Insufficient documentation

## 2023-12-19 DIAGNOSIS — M25552 Pain in left hip: Secondary | ICD-10-CM | POA: Diagnosis not present

## 2023-12-19 DIAGNOSIS — M5459 Other low back pain: Secondary | ICD-10-CM | POA: Diagnosis not present

## 2023-12-19 NOTE — Therapy (Signed)
 OUTPATIENT PHYSICAL THERAPY LOWER EXTREMITY EVALUATION   Patient Name: Audrey Tucker MRN: [id number] DOB:Apr 14, 1945, 78 y.o., female Today's Date: 12/19/2023  END OF SESSION:  PT End of Session - 12/19/23 0940     Visit Number 2    Number of Visits 16    Date for Recertification  02/01/24    PT Start Time 0935    PT Stop Time 1015    PT Time Calculation (min) 40 min    Activity Tolerance Patient tolerated treatment well    Behavior During Therapy [facility] for tasks assessed/performed          Past Medical History:  Diagnosis Date   Adenomatous colon polyp    tubular   Arthritis    Chicken pox    Diverticulosis    Hernia, femoral    pt stated surgery in august of 2022, on left side    PONV (postoperative nausea and vomiting)    Seasonal allergies    Past Surgical History:  Procedure Laterality Date   COSMETIC SURGERY     EYE SURGERY Bilateral    cataract   FOOT NEUROMA SURGERY Right 04/29/2015   Dr Magdalen   HERNIA REPAIR     INGUINAL HERNIA REPAIR Bilateral 09/25/2020   Procedure: LAPAROSCOPIC Bilateral INGUINAL HERNIA REPAIR WITH MESH;  Surgeon: Kinsinger, Herlene Righter, MD;  Location: [facility];  Service: General;  Laterality: Bilateral;   right hand surgery      SHOULDER ARTHROSCOPY Left    Patient Active Problem List   Diagnosis Date Noted   Hyperlipidemia 08/01/2023   Chronic neck pain 03/23/2023   Hot flash not due to menopause 03/23/2023   Apical lung scarring 06/16/2022   Abdominal aortic atherosclerosis 05/16/2020   Insomnia 05/16/2020   Seasonal allergies 05/16/2020   RLS (restless legs syndrome) 12/31/2016   Trochanteric bursitis of both hips 08/09/2016   Generalized osteoarthritis of hand 07/27/2016   Headache, variant migraine 07/26/2016    PCP: Betty Jordan MD   REFERRING PROVIDER: Dr Lonni Poli   REFERRING DIAG:  Diagnosis  M70.62 (ICD-10-CM) - Trochanteric bursitis, left hip   THERAPY DIAG:  No diagnosis found.  Rationale for  Evaluation and Treatment: Rehabilitation  ONSET DATE:  Long standing pain   SUBJECTIVE:   SUBJECTIVE STATEMENT: The patient is having more anterior hip tightness today on the right. Her left isnt hurting as bad. She has tingling in the front of the shin.   Eval: Patient has long history of bilateral hip pain.  At different x 1 hip has been worse than the other.  At this time her left hip is worse.  She has increased pain in the morning.  She also has numbness in her foot.  She has not had her back looked at but is has a history of degenerative disc disease in her neck.  When she moves in the morning the pain tends to decrease and the numbness in her foot tends to decrease.  She is generally active.  PERTINENT HISTORY: Right sided neuroma removal, left shoulder scope,  PAIN:  Are you having pain? Yes: NPRS scale: 1-2 right now  Pain location: down the back of the left leg  Pain description: sharp  Aggravating factors:  Relieving factors: stretching  PRECAUTIONS: None  RED FLAGS: None   WEIGHT BEARING RESTRICTIONS: No  FALLS:  Has patient fallen in last 6 months? Yes. Number of falls 1  Slipped on wet leaves fell on tailbone.   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   PLOF: Independent  PATIENT GOALS:  To have less pain   NEXT MD VISIT:  Sees the MD next week   OBJECTIVE:  Note: Objective measures were completed at Evaluation unless otherwise noted.  DIAGNOSTIC FINDINGS:  Bilateral hips:  An AP pelvis lateral left hip shows well located hips. There is no  significant arthritis hip joint at all. There is no cortical  irregularities around either trochanteric area.   PATIENT SURVEYS:  LEFS  Extreme difficulty/unable (0), Quite a bit of difficulty (1), Moderate difficulty (2), Little difficulty (3), No difficulty (4) Survey date:    Any of your usual work, housework or school  activities   2. Usual hobbies, recreational or sporting activities   3. Getting into/out of the bath   4. Walking between rooms   5. Putting on socks/shoes   6. Squatting    7. Lifting an object, like a bag of groceries from the floor   8. Performing light activities around your home   9. Performing heavy activities around your home   10. Getting into/out of a car   11. Walking 2 blocks   12. Walking 1 mile   13. Going up/down 10 stairs (1 flight)   14. Standing for 1 hour   15.  sitting for 1 hour   16. Running on even ground   17. Running on uneven ground   18. Making sharp turns while running fast   19. Hopping    20. Rolling over in bed   Score total:  45     COGNITION: Overall cognitive status: Within functional limits for tasks assessed     SENSATION: Numbness in the left foot. Comes and goes    POSTURE: No Significant postural limitations  PALPATION: Spasming in bilateral lumbar paraspinals/ lateral hip area and into bilateral IT bands   LOWER EXTREMITY ROM:  Passive ROM Right eval Left eval  Hip flexion 125 125  Hip extension    Hip abduction    Hip adduction    Hip internal rotation WNL WNL  Hip external rotation WNL WNL  Knee flexion    Knee extension    Ankle dorsiflexion    Ankle plantarflexion    Ankle inversion    Ankle eversion     (Blank rows = not tested) hyper mobility in hips   LOWER EXTREMITY MMT:  MMT Right eval Left eval  Hip flexion 23.6 21.8  Hip extension    Hip abduction 18.6 26.1  Hip adduction    Hip internal rotation    Hip external rotation    Knee flexion    Knee extension 39.0 40.8  Ankle dorsiflexion    Ankle plantarflexion    Ankle inversion    Ankle eversion     (Blank rows = not tested)  LUMBAR ROM:   Active  A/PROM  eval  Flexion Pulling in right hnastring but full   Extension Nothing   Right lateral flexion   Left lateral flexion   Right rotation Sensation in mid back   Left rotation Pulling in  left leg    (Blank rows = not tested)     FUNCTIONAL TESTS:  Single leg stance right 15 seconds Left can't maintain single leg stance   GAIT: Mild lateral movement bilateral  TREATMENT DATE:  Manual:  Soft tissue mobilization to lumbar spine and upper gluteal on the left  Right anterior hip roller  Left LAD Grade II and III with oscillations   Neuro re-ed:  Reviewed her current HEP  Supine march x10 ROe of 3 Supine march with switch 2x10 RPE of 5   Supine bridge 3x10 qith red band RPE of 5   Standing slow march. Difficulty with weight bearing on the left but easier on the right 2x12    Manual:  Soft tissue mobilization to lumbar spine and upper gluteal   Eval: Manual:  Soft tissue mobilization to lumbar spine and upper gluteal   Neuro-re-ed  See HEP below   PATIENT EDUCATION:  Education details: HEP, symptom management Person educated: Patient Education method: Explanation, Demonstration, Tactile cues, Verbal cues, and Handouts Education comprehension: verbalized understanding, returned demonstration, verbal cues required, tactile cues required, and needs further education  HOME EXERCISE PROGRAM: Access Code: [email or web address] URL: [email or web address] Date: 12/07/2023 Prepared by: Alm Don  Exercises - Supine Piriformis Stretch with Foot on Ground  - 1 x daily - 7 x weekly - 3 sets - 10 reps - Standing Glute Med Mobilization with Small Ball on Wall  - 1 x daily - 7 x weekly - 3 sets - 10 reps - Supine Bridge  - 1 x daily - 7 x weekly - 3 sets - 10 reps - Supine March  - 1 x daily - 7 x weekly - 3 sets - 10 reps - Hooklying Clamshell with Resistance  - 1 x daily - 7 x weekly - 3 sets - 10 reps - Supine March  - 1 x daily - 7 x weekly - 3 sets - 10 reps  ASSESSMENT:  CLINICAL IMPRESSION: The patient had significant  tightness in her TFL and lateral quad today on the right. We reviewed using the roller to that area. We also performed trigger point release to lumbar spine. We reviewed her dermatomes and areas that could be causing the tingling into her shin.We reviewed the importance of the exercises that were given to her. She has been using her total gym. She was advised to continue using the total gymbut to work on these exercises as well. We also gave her the standing march as a way to measure her progress. She was advised to keep hips stable using her UE. She was advised if this is irritating to stop.    Eval:  Patient is a 78 year old female who presents with bilateral hip pain left greater than right.  She also has pain that radiates down the back of her leg and numbness in her foot.  Both these issues are worse in the morning.  She presents with bilateral gluteal weakness.  She has significant spasming in her gluteal and lower lumbar spine particularly on the left side.  She has increased pain with left lumbar rotation.  She has full hip mobility.  She is likely hypermobile in her hips.  She would benefit from a program that focuses on hip stability and decreasing spasming in her lumbar spine and upper gluteal area. OBJECTIVE IMPAIRMENTS: Abnormal gait, decreased activity tolerance, decreased endurance, decreased mobility, difficulty walking, decreased ROM, decreased strength, and pain.   ACTIVITY LIMITATIONS: carrying, standing, squatting, stairs, and locomotion level  PARTICIPATION LIMITATIONS: cleaning, laundry, shopping, and community activity  PERSONAL FACTORS: Age are also affecting patient's functional outcome. Time of onset.   REHAB POTENTIAL: Good  CLINICAL DECISION MAKING: Evolving/moderate complexity increasing pain levels  EVALUATION COMPLEXITY: Moderate   GOALS: Goals reviewed with patient? Yes  SHORT TERM GOALS: Target date: 01/04/2024   Patient will increase bilateral hip  strength by 5 lbs  Baseline: Goal status: INITIAL  2.  Patient will report a 50% reduction in pain and numbness in her foot Baseline:  Goal status: INITIAL  3.  Patient will  Baseline:  Goal status: INITIAL   LONG TERM GOALS: Target date: 02/01/2024    Patient will go up.dwon 12 steps without pain  Baseline:  Goal status: INITIAL  2.  Patient will be independent with full HEP for strength and stability  Baseline:  Goal status: INITIAL  3.  Patent will ambulate community distances without pain  Baseline:  Goal status: INITIAL     PLAN:  PT FREQUENCY: 2x/week  PT DURATION: 8 weeks  PLANNED INTERVENTIONS: 97164- PT Re-evaluation, 97110-Therapeutic exercises, 97530- Therapeutic activity, 97112- Neuromuscular re-education, 97535- Self Care, [id number]- Manual therapy, [id number]- Gait training, [phone]- Aquatic Therapy, [phone]- Electrical stimulation (unattended), Patient/Family education, Joint manipulation, Spinal mobilization, Cryotherapy, and Moist heat  PLAN FOR NEXT SESSION:  Begin with manual therapy to bilateral hips and lumbar spine.  Seems to be more of a stability issue.  Reviewed current exercises.  Progressed to standing exercises tolerated.  Consider gym exercises.  Discussed how to use TA breathing with gym exercises.   Alm JINNY Don, PT 12/19/2023, 10:05 AM

## 2023-12-22 ENCOUNTER — Ambulatory Visit
Admission: RE | Admit: 2023-12-22 | Discharge: 2023-12-22 | Disposition: A | Discharge status: Home / Self Care | Source: Ambulatory Visit | Attending: Orthopaedic Surgery | Admitting: Orthopaedic Surgery

## 2023-12-22 ENCOUNTER — Ambulatory Visit
Admission: RE | Admit: 2023-12-22 | Discharge: 2023-12-22 | Disposition: A | Source: Ambulatory Visit | Attending: Orthopaedic Surgery | Admitting: Orthopaedic Surgery

## 2023-12-22 DIAGNOSIS — S76311A Strain of muscle, fascia and tendon of the posterior muscle group at thigh level, right thigh, initial encounter: Secondary | ICD-10-CM | POA: Diagnosis not present

## 2023-12-22 DIAGNOSIS — M7062 Trochanteric bursitis, left hip: Secondary | ICD-10-CM

## 2023-12-22 DIAGNOSIS — S76312A Strain of muscle, fascia and tendon of the posterior muscle group at thigh level, left thigh, initial encounter: Secondary | ICD-10-CM | POA: Diagnosis not present

## 2023-12-22 DIAGNOSIS — M7061 Trochanteric bursitis, right hip: Secondary | ICD-10-CM

## 2023-12-27 ENCOUNTER — Ambulatory Visit: Payer: Self-pay

## 2023-12-27 ENCOUNTER — Ambulatory Visit (HOSPITAL_BASED_OUTPATIENT_CLINIC_OR_DEPARTMENT_OTHER): Admitting: Physical Therapy

## 2023-12-27 ENCOUNTER — Ambulatory Visit: Admitting: Family Medicine

## 2023-12-27 VITALS — BP 120/70 | HR 67 | Temp 98.4°F | Resp 16 | Ht 64.0 in | Wt 120.8 lb

## 2023-12-27 DIAGNOSIS — F419 Anxiety disorder, unspecified: Secondary | ICD-10-CM

## 2023-12-27 DIAGNOSIS — M25552 Pain in left hip: Secondary | ICD-10-CM | POA: Diagnosis not present

## 2023-12-27 DIAGNOSIS — M25551 Pain in right hip: Secondary | ICD-10-CM

## 2023-12-27 DIAGNOSIS — M5459 Other low back pain: Secondary | ICD-10-CM

## 2023-12-27 MED ORDER — HYDROXYZINE HCL 25 MG PO TABS: 12.5000 mg | ORAL_TABLET | Freq: Two times a day (BID) | ORAL | 1 refills | Status: AC | PRN

## 2023-12-27 MED ORDER — PAROXETINE HCL 20 MG PO TABS: 20.0000 mg | ORAL_TABLET | Freq: Every day | ORAL | 2 refills | Status: DC

## 2023-12-27 NOTE — Therapy (Unsigned)
 OUTPATIENT PHYSICAL THERAPY LOWER EXTREMITY EVALUATION   Patient Name: Audrey Tucker MRN: [id number] DOB:[birth year], 78 y.o., female Today's Date: 12/28/2023  END OF SESSION:  PT End of Session - 12/28/23 0827     Visit Number 3    Number of Visits 16    Date for Recertification  02/01/24    PT Start Time 0930    PT Stop Time 1012    PT Time Calculation (min) 42 min    Activity Tolerance Patient tolerated treatment well    Behavior During Therapy [facility] for tasks assessed/performed           Past Medical History:  Diagnosis Date   Adenomatous colon polyp    tubular   Arthritis    Chicken pox    Diverticulosis    Hernia, femoral    pt stated surgery in august of 2022, on left side    PONV (postoperative nausea and vomiting)    Seasonal allergies    Past Surgical History:  Procedure Laterality Date   COSMETIC SURGERY     EYE SURGERY Bilateral    cataract   FOOT NEUROMA SURGERY Right 04/29/2015   Dr Magdalen   HERNIA REPAIR     INGUINAL HERNIA REPAIR Bilateral 09/25/2020   Procedure: LAPAROSCOPIC Bilateral INGUINAL HERNIA REPAIR WITH MESH;  Surgeon: Kinsinger, Herlene Righter, MD;  Location: [facility];  Service: General;  Laterality: Bilateral;   right hand surgery      SHOULDER ARTHROSCOPY Left    Patient Active Problem List   Diagnosis Date Noted   Hyperlipidemia 08/01/2023   Chronic neck pain 03/23/2023   Hot flash not due to menopause 03/23/2023   Apical lung scarring 06/16/2022   Abdominal aortic atherosclerosis 05/16/2020   Insomnia 05/16/2020   Seasonal allergies 05/16/2020   RLS (restless legs syndrome) 12/31/2016   Trochanteric bursitis of both hips 08/09/2016   Generalized osteoarthritis of hand 07/27/2016   Headache, variant migraine 07/26/2016    PCP: Betty Jordan MD   REFERRING PROVIDER: Dr Lonni Poli   REFERRING DIAG:  Diagnosis  M70.62 (ICD-10-CM) - Trochanteric bursitis, left hip   THERAPY DIAG:  Pain in left hip  Pain in right  hip  Other low back pain  Rationale for Evaluation and Treatment: Rehabilitation  ONSET DATE:  Long standing pain   SUBJECTIVE:   SUBJECTIVE STATEMENT: The patient is having more anterior hip tightness today on the right. Her left isnt hurting as bad. She has tingling in the front of the shin.   Eval: Patient has long history of bilateral hip pain.  At different x 1 hip has been worse than the other.  At this time her left hip is worse.  She has increased pain in the morning.  She also has numbness in her foot.  She has not had her back looked at but is has a history of degenerative disc disease in her neck.  When she moves in the morning the pain tends to decrease and the numbness in her foot tends to decrease.  She is generally active.  PERTINENT HISTORY: Right sided neuroma removal, left shoulder scope,  PAIN:  Are you having pain? Yes: NPRS scale: 1-2 right now  Pain location: down the back of the left leg  Pain description: sharp  Aggravating factors:  Relieving factors: stretching  PRECAUTIONS: None  RED FLAGS: None   WEIGHT BEARING RESTRICTIONS: No  FALLS:  Has patient fallen in last 6 months? Yes. Number of falls 1  Slipped on  wet leaves fell on tailbone.   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   PLOF: Independent  PATIENT GOALS:  To have less pain   NEXT MD VISIT:  Sees the MD next week   OBJECTIVE:  Note: Objective measures were completed at Evaluation unless otherwise noted.  DIAGNOSTIC FINDINGS:  Bilateral hips:  An AP pelvis lateral left hip shows well located hips. There is no  significant arthritis hip joint at all. There is no cortical  irregularities around either trochanteric area.   PATIENT SURVEYS:  LEFS  Extreme difficulty/unable (0), Quite a bit of difficulty (1), Moderate difficulty (2), Little difficulty (3), No difficulty (4) Survey date:    Any of  your usual work, housework or school activities   2. Usual hobbies, recreational or sporting activities   3. Getting into/out of the bath   4. Walking between rooms   5. Putting on socks/shoes   6. Squatting    7. Lifting an object, like a bag of groceries from the floor   8. Performing light activities around your home   9. Performing heavy activities around your home   10. Getting into/out of a car   11. Walking 2 blocks   12. Walking 1 mile   13. Going up/down 10 stairs (1 flight)   14. Standing for 1 hour   15.  sitting for 1 hour   16. Running on even ground   17. Running on uneven ground   18. Making sharp turns while running fast   19. Hopping    20. Rolling over in bed   Score total:  45     COGNITION: Overall cognitive status: Within functional limits for tasks assessed     SENSATION: Numbness in the left foot. Comes and goes    POSTURE: No Significant postural limitations  PALPATION: Spasming in bilateral lumbar paraspinals/ lateral hip area and into bilateral IT bands   LOWER EXTREMITY ROM:  Passive ROM Right eval Left eval  Hip flexion 125 125  Hip extension    Hip abduction    Hip adduction    Hip internal rotation WNL WNL  Hip external rotation WNL WNL  Knee flexion    Knee extension    Ankle dorsiflexion    Ankle plantarflexion    Ankle inversion    Ankle eversion     (Blank rows = not tested) hyper mobility in hips   LOWER EXTREMITY MMT:  MMT Right eval Left eval  Hip flexion 23.6 21.8  Hip extension    Hip abduction 18.6 26.1  Hip adduction    Hip internal rotation    Hip external rotation    Knee flexion    Knee extension 39.0 40.8  Ankle dorsiflexion    Ankle plantarflexion    Ankle inversion    Ankle eversion     (Blank rows = not tested)  LUMBAR ROM:   Active  A/PROM  eval  Flexion Pulling in right hnastring but full   Extension Nothing   Right lateral flexion   Left lateral flexion   Right rotation Sensation in  mid back   Left rotation Pulling in left leg    (Blank rows = not tested)     FUNCTIONAL TESTS:  Single leg stance right 15 seconds Left can't maintain single leg stance   GAIT: Mild lateral movement bilateral  TREATMENT DATE:    11/11 Manual:  Soft tissue mobilization to lumbar spine and upper gluteal on the lbilateral  Left lateral IT band roller  STM to bilateral IT band   Neuro-re-ed:  Bridge 3x12 red   Standing slow march 3x12  Supine march with education n progression  Standing hip abduction 2x10 each leg  Cuing with standing exercises to use UE to keep hip stable  Last: Manual:  Soft tissue mobilization to lumbar spine and upper gluteal on the left  Right anterior hip roller  Left LAD Grade II and III with oscillations   Neuro re-ed:  Reviewed her current HEP  Supine march x10 ROe of 3 Supine march with switch 2x10 RPE of 5   Supine bridge 3x10 qith red band RPE of 5   Standing slow march. Difficulty with weight bearing on the left but easier on the right 2x12    Manual:  Soft tissue mobilization to lumbar spine and upper gluteal   Eval: Manual:  Soft tissue mobilization to lumbar spine and upper gluteal   Neuro-re-ed  See HEP below   PATIENT EDUCATION:  Education details: HEP, symptom management Person educated: Patient Education method: Explanation, Demonstration, Tactile cues, Verbal cues, and Handouts Education comprehension: verbalized understanding, returned demonstration, verbal cues required, tactile cues required, and needs further education  HOME EXERCISE PROGRAM: Access Code: [email or web address] URL: [email or web address] Date: 12/07/2023 Prepared by: Alm Don  Exercises - Supine Piriformis Stretch with Foot on Ground  - 1 x daily - 7 x weekly - 3 sets - 10 reps - Standing Glute Med Mobilization  with Small Ball on Wall  - 1 x daily - 7 x weekly - 3 sets - 10 reps - Supine Bridge  - 1 x daily - 7 x weekly - 3 sets - 10 reps - Supine March  - 1 x daily - 7 x weekly - 3 sets - 10 reps - Hooklying Clamshell with Resistance  - 1 x daily - 7 x weekly - 3 sets - 10 reps - Supine March  - 1 x daily - 7 x weekly - 3 sets - 10 reps  ASSESSMENT:  CLINICAL IMPRESSION: The patient tolerated treatment well. Therapy had more difficulty identifying trigger points today. We expanded her exercise to program to standing exercises. She could feel the standing exercises more. She was advised to use her stretches and soft tissue mobilization   Eval:  Patient is a 78 year old female who presents with bilateral hip pain left greater than right.  She also has pain that radiates down the back of her leg and numbness in her foot.  Both these issues are worse in the morning.  She presents with bilateral gluteal weakness.  She has significant spasming in her gluteal and lower lumbar spine particularly on the left side.  She has increased pain with left lumbar rotation.  She has full hip mobility.  She is likely hypermobile in her hips.  She would benefit from a program that focuses on hip stability and decreasing spasming in her lumbar spine and upper gluteal area. OBJECTIVE IMPAIRMENTS: Abnormal gait, decreased activity tolerance, decreased endurance, decreased mobility, difficulty walking, decreased ROM, decreased strength, and pain.   ACTIVITY LIMITATIONS: carrying, standing, squatting, stairs, and locomotion level  PARTICIPATION LIMITATIONS: cleaning, laundry, shopping, and community activity  PERSONAL FACTORS: Age are also affecting patient's functional outcome. Time of onset.   REHAB POTENTIAL: Good  CLINICAL DECISION MAKING: Evolving/moderate complexity increasing pain levels   EVALUATION COMPLEXITY: Moderate  GOALS: Goals reviewed with patient? Yes  SHORT TERM GOALS: Target date:  01/04/2024   Patient will increase bilateral hip strength by 5 lbs  Baseline: Goal status: INITIAL  2.  Patient will report a 50% reduction in pain and numbness in her foot Baseline:  Goal status: INITIAL  3.  Patient will  Baseline:  Goal status: INITIAL   LONG TERM GOALS: Target date: 02/01/2024    Patient will go up.dwon 12 steps without pain  Baseline:  Goal status: INITIAL  2.  Patient will be independent with full HEP for strength and stability  Baseline:  Goal status: INITIAL  3.  Patent will ambulate community distances without pain  Baseline:  Goal status: INITIAL     PLAN:  PT FREQUENCY: 2x/week  PT DURATION: 8 weeks  PLANNED INTERVENTIONS: 97164- PT Re-evaluation, 97110-Therapeutic exercises, 97530- Therapeutic activity, 97112- Neuromuscular re-education, 97535- Self Care, 02859- Manual therapy, [id number]- Gait training, [phone]- Aquatic Therapy, [phone]- Electrical stimulation (unattended), Patient/Family education, Joint manipulation, Spinal mobilization, Cryotherapy, and Moist heat  PLAN FOR NEXT SESSION:  Begin with manual therapy to bilateral hips and lumbar spine.  Seems to be more of a stability issue.  Reviewed current exercises.  Progressed to standing exercises tolerated.  Consider gym exercises.  Discussed how to use TA breathing with gym exercises.   Alm JINNY Don, PT 12/28/2023, 8:52 AM

## 2023-12-27 NOTE — Patient Instructions (Signed)
 A few things to remember from today's visit:  Anxiety disorder, unspecified type - Plan: hydrOXYzine (ATARAX) 25 MG tablet, PARoxetine (PAXIL) 20 MG tablet  We increased dose of Paroxetine from 10 mg to 20 mg. We can even go higher if needed for anxiety. Hydroxyzine 25 mg 1/2-1 tab 2 times daily as needed for acute anxiety, causes drowsiness. Let me know how you are feeling after dose increased, may take 6 weeks to reach max benefit.  If you need refills for medications you take chronically, please call your pharmacy. Do not use My Chart to request refills or for acute issues that need immediate attention. If you send a my chart message, it may take a few days to be addressed, specially if I am not in the office.  Please be sure medication list is accurate. If a new problem present, please set up appointment sooner than planned today.

## 2023-12-27 NOTE — Progress Notes (Signed)
 ACUTE VISIT Chief Complaint  Patient presents with   Anxiety    Patient complains of increased anxiety x1.5 weeks after signing paperwork for new condo   Discussed the use of AI scribe software for clinical note transcription with the patient, who gave verbal consent to proceed. History of Present Illness Audrey Tucker is a 78 year old female who PMHx significant for abdominal aortic atherosclerosis, OA, RLS, insomnia, and seasonal allergies here today c/o worsening anxiety as described above.  She has been experiencing increasing anxiety over the past ten days, coinciding with the stress of purchasing a new condo and the need to vacate her current rental home. She feels overwhelmed with the tasks of cleaning out the current house, preparing the new condo, and managing holiday preparations, leading to difficulty sleeping as she lies awake thinking about the tasks she needs to accomplish the next day.  She has a history of anxiety in the past, when she was prescribed lorazepam  during a period of anxiety when her husband had colon cancer, which was effective when taken as needed. She has not tried other medications for anxiety such as SSRI. Denies symptoms suggestive of depression. She states that 'everything's going great' except for feeling overwhelmed. She is currently on Paroxetine 10 mg daily for hot flashes.  She experiences symptoms of restlessness, irritability, and difficulty concentrating, describing it as feeling like she has ADHD. She is snapping at her husband, which she feels is undeserved. She has not engaged in psychotherapy, citing a lack of time and belief that it would not be helpful.  She mentions that her husband, who had a head injury, is unable to assist with the tasks as he used to, which adds to her burden. She is responsible for managing the move and the associated tasks, including dealing with items in storage and making decisions about the new condo, which requires  updates.     12/27/2023    6:31 PM 10/11/2023   10:00 AM 08/26/2022   10:45 AM 06/16/2022    2:19 PM 08/05/2021    1:58 PM  Depression screen PHQ 2/9  Decreased Interest 0 0 0 0 0  Down, Depressed, Hopeless 0 0 0 0 0  PHQ - 2 Score 0 0 0 0 0  Altered sleeping  0     Tired, decreased energy  0     Change in appetite  0     Feeling bad or failure about yourself   0     Trouble concentrating  2     Moving slowly or fidgety/restless  0     Suicidal thoughts  0     PHQ-9 Score  2         Data saved with a previous flowsheet row definition      12/27/2023    6:32 PM 10/11/2023   10:01 AM  GAD 7 : Generalized Anxiety Score  Nervous, Anxious, on Edge 2 0  Control/stop worrying 3 0  Worry too much - different things 3 0  Trouble relaxing 3 0  Restless 3 0  Easily annoyed or irritable 3 0  Afraid - awful might happen 0 0  Total GAD 7 Score 17 0  Anxiety Difficulty Not difficult at all    Review of Systems  Constitutional:  Negative for activity change, appetite change, chills and fever.  Respiratory:  Negative for shortness of breath.   Cardiovascular:  Negative for chest pain and palpitations.  Gastrointestinal:  Negative for abdominal  pain, nausea and vomiting.  Endocrine: Negative for cold intolerance and heat intolerance.  Genitourinary:  Negative for decreased urine volume and hematuria.  Psychiatric/Behavioral:  Positive for sleep disturbance. Negative for confusion, hallucinations and suicidal ideas.   See other pertinent positives and negatives in HPI.  Current Outpatient Medications on File Prior to Visit  Medication Sig Dispense Refill   meloxicam (MOBIC) 7.5 MG tablet Take 1 tablet (7.5 mg total) by mouth daily.     No current facility-administered medications on file prior to visit.   Past Medical History:  Diagnosis Date   Adenomatous colon polyp    tubular   Arthritis    Chicken pox    Diverticulosis    Hernia, femoral    pt stated surgery in august of  2022, on left side    PONV (postoperative nausea and vomiting)    Seasonal allergies    No Known Allergies  Social History   Socioeconomic History   Marital status: Married    Spouse name: Not on file   Number of children: 0   Years of education: 16   Highest education level: Associate degree: academic program  Occupational History   Occupation: works occasionally     Comment: retired   Tobacco Use   Smoking status: Former    Current packs/day: 0.00    Average packs/day: 0.5 packs/day for 2.0 years (1.0 ttl pk-yrs)    Types: Cigarettes    Quit date: 04/05/1967    Years since quitting: 56.7   Smokeless tobacco: Never   Tobacco comments:    rare use in college   Vaping Use   Vaping status: Never Used  Substance and Sexual Activity   Alcohol use: Yes    Alcohol/week: 3.0 standard drinks of alcohol    Types: 3 Glasses of wine per week    Comment: social   Drug use: No   Sexual activity: Not Currently  Other Topics Concern   Not on file  Social History Narrative   Married   No children   HH 2   Yard work, programmer, applications   Works occasionally    Social Drivers of Corporate Investment Banker Strain: Low Risk  (12/27/2023)   Overall Financial Resource Strain (CARDIA)    Difficulty of Paying Living Expenses: Not hard at all  Food Insecurity: No Food Insecurity (12/27/2023)   Hunger Vital Sign    Worried About Running Out of Food in the Last Year: Never true    Ran Out of Food in the Last Year: Never true  Transportation Needs: No Transportation Needs (12/27/2023)   PRAPARE - Administrator, Civil Service (Medical): No    Lack of Transportation (Non-Medical): No  Physical Activity: Sufficiently Active (12/27/2023)   Exercise Vital Sign    Days of Exercise per Week: 3 days    Minutes of Exercise per Session: 60 min  Stress: Stress Concern Present (12/27/2023)   [employer] of Occupational Health - Occupational Stress Questionnaire    Feeling of Stress:  Very much  Social Connections: Socially Integrated (12/27/2023)   Social Connection and Isolation Panel    Frequency of Communication with Friends and Family: More than three times a week    Frequency of Social Gatherings with Friends and Family: Twice a week    Attends Religious Services: More than 4 times per year    Active Member of [employer] or Organizations: Yes    Attends Banker Meetings: More than 4 times  per year    Marital Status: Married   Vitals:   12/27/23 1525  BP: 120/70  Pulse: 67  Resp: 16  Temp: 98.4 F (36.9 C)  SpO2: 98%   Body mass index is 20.74 kg/m.  Physical Exam Vitals and nursing note reviewed.  Constitutional:      General: She is not in acute distress.    Appearance: She is well-developed.  HENT:     Head: Normocephalic and atraumatic.  Eyes:     Conjunctiva/sclera: Conjunctivae normal.  Cardiovascular:     Rate and Rhythm: Normal rate and regular rhythm.     Heart sounds: No murmur heard. Pulmonary:     Effort: Pulmonary effort is normal. No respiratory distress.     Breath sounds: Normal breath sounds.  Skin:    General: Skin is warm.     Findings: No erythema or rash.  Neurological:     General: No focal deficit present.     Mental Status: She is alert and oriented to person, place, and time.     Gait: Gait normal.  Psychiatric:        Mood and Affect: Mood and affect normal.        Thought Content: Thought content does not include homicidal or suicidal ideation. Thought content does not include homicidal or suicidal plan.   ASSESSMENT AND PLAN:  Audrey Tucker was seen today for worsening anxiety.  Anxiety disorder, unspecified type -     hydrOXYzine HCl; Take 0.5-1 tablets (12.5-25 mg total) by mouth 2 (two) times daily as needed for anxiety (anxiety).  Dispense: 30 tablet; Refill: 1 -     PARoxetine HCl; Take 1 tablet (20 mg total) by mouth daily.  Dispense: 30 tablet; Refill: 2  She is interested in short term treatment,  planning on moving to a new place by 03/2024. We discussed treatment options. She does not think CBT will help and she has no time to attend visits. I do not recommend benzodiazepines, which she has taking in the past, due to risk of side effects. She is on Paroxetine at this time, so instead adding a new med, which may increase the risk for serotonin synd, we can increase dose from 10 mg to 20 mg daily. Some side effects of SSRI's discussed. Hydroxyzine 25 mg can be used for short period of time to help with acute episodes of anxiety at night She will let me know in about 6 weeks if medications have been effective.  I personally spent a total of 32 minutes in the care of the patient today including preparing to see the patient, getting/reviewing separately obtained history, performing a medically appropriate exam/evaluation, counseling and educating, and documenting clinical information in the EHR.  Return in about 3 months (around 03/28/2024).  Kaien Pezzullo G. Gayatri Teasdale, MD  [facility] [place] [facility]. [place] office.

## 2023-12-27 NOTE — Telephone Encounter (Signed)
Appt today at 3:30pm

## 2023-12-27 NOTE — Telephone Encounter (Signed)
 FYI Only or Action Required?: FYI only for provider: appointment scheduled on today.  Patient was last seen in primary care on 10/11/2023 by Luke Chiquita SAUNDERS, DO.  Called Nurse Triage reporting Depression.  Symptoms began a week ago.  Interventions attempted: Nothing.  Symptoms are: gradually worsening.  Triage Disposition: See Physician Within 24 Hours  Patient/caregiver understands and will follow disposition?: Yes, will follow disposition  Copied from CRM #[id number]. Topic: Clinical - Red Word Triage >> Dec 27, 2023 11:21 AM Larissa RAMAN wrote: [facility] that prompted transfer to Nurse Triage: anxiety Reason for Disposition  [1] Depression AND [2] getting worse (e.g., sleeping poorly, less able to do activities of daily living)  Answer Assessment - Initial Assessment Questions 1. CONCERN: What happened that made you call today?     Feels like she has been snapping at husband, this started about 1.5 weeks ago when signed on house.  2. DEPRESSION SYMPTOM SCREENING: How are you feeling overall? (e.g., decreased energy, increased sleeping or difficulty sleeping, difficulty concentrating, feelings of sadness, guilt, hopelessness, or worthlessness)     Difficulty sleeping,  3. RISK OF HARM - SUICIDAL IDEATION:  Do you ever have thoughts of hurting or killing yourself?  (e.g., yes, no, no but preoccupation with thoughts about death)     denies 4. RISK OF HARM - HOMICIDAL IDEATION:  Do you ever have thoughts of hurting or killing someone else?  (e.g., yes, no, no but preoccupation with thoughts about death)     denies 5. FUNCTIONAL IMPAIRMENT: How have things been going for you overall? Have you had more difficulty than usual doing your normal daily activities?  (e.g., better, same, worse; self-care, school, work, interactions)     denies 6. SUPPORT: Who is with you now? Who do you live with? Do you have family or friends who you can talk to?      Yes,  7. THERAPIST: Do you  have a counselor or therapist? If Yes, ask: What is their name?     denies 8. STRESSORS: Has there been any new stress or recent changes in your life?     Recently closed on home, estimates for home repairs, holidays approaching 9. ALCOHOL USE OR SUBSTANCE USE (DRUG USE): Do you drink alcohol or use any illegal drugs?     denies 10. OTHER: Do you have any other physical symptoms right now? (e.g., fever)       denies  Protocols used: Depression-A-AH

## 2023-12-28 ENCOUNTER — Encounter (HOSPITAL_BASED_OUTPATIENT_CLINIC_OR_DEPARTMENT_OTHER): Payer: Self-pay | Admitting: Physical Therapy

## 2024-01-04 NOTE — Therapy (Deleted)
 OUTPATIENT PHYSICAL THERAPY LOWER EXTREMITY EVALUATION   Patient Name: Audrey Tucker MRN: [id number] DOB:[birth year], 78 y.o., female Today's Date: 01/04/2024  END OF SESSION:     Past Medical History:  Diagnosis Date   Adenomatous colon polyp    tubular   Arthritis    Chicken pox    Diverticulosis    Hernia, femoral    pt stated surgery in august of 2022, on left side    PONV (postoperative nausea and vomiting)    Seasonal allergies    Past Surgical History:  Procedure Laterality Date   COSMETIC SURGERY     EYE SURGERY Bilateral    cataract   FOOT NEUROMA SURGERY Right 04/29/2015   Dr Magdalen   HERNIA REPAIR     INGUINAL HERNIA REPAIR Bilateral 09/25/2020   Procedure: LAPAROSCOPIC Bilateral INGUINAL HERNIA REPAIR WITH MESH;  Surgeon: Kinsinger, Herlene Righter, MD;  Location: [facility];  Service: General;  Laterality: Bilateral;   right hand surgery      SHOULDER ARTHROSCOPY Left    Patient Active Problem List   Diagnosis Date Noted   Hyperlipidemia 08/01/2023   Chronic neck pain 03/23/2023   Hot flash not due to menopause 03/23/2023   Apical lung scarring 06/16/2022   Abdominal aortic atherosclerosis 05/16/2020   Insomnia 05/16/2020   Seasonal allergies 05/16/2020   RLS (restless legs syndrome) 12/31/2016   Trochanteric bursitis of both hips 08/09/2016   Generalized osteoarthritis of hand 07/27/2016   Headache, variant migraine 07/26/2016    PCP: Betty Jordan MD   REFERRING PROVIDER: Dr Lonni Poli   REFERRING DIAG:  Diagnosis  M70.62 (ICD-10-CM) - Trochanteric bursitis, left hip   THERAPY DIAG:  No diagnosis found.  Rationale for Evaluation and Treatment: Rehabilitation  ONSET DATE:  Long standing pain   SUBJECTIVE:   SUBJECTIVE STATEMENT: The patient is having more anterior hip tightness today on the right. Her left isnt hurting as bad. She has tingling in the front of the shin.   Eval: Patient has long history of bilateral hip pain.  At  different x 1 hip has been worse than the other.  At this time her left hip is worse.  She has increased pain in the morning.  She also has numbness in her foot.  She has not had her back looked at but is has a history of degenerative disc disease in her neck.  When she moves in the morning the pain tends to decrease and the numbness in her foot tends to decrease.  She is generally active.  PERTINENT HISTORY: Right sided neuroma removal, left shoulder scope,  PAIN:  Are you having pain? Yes: NPRS scale: 1-2 right now  Pain location: down the back of the left leg  Pain description: sharp  Aggravating factors:  Relieving factors: stretching  PRECAUTIONS: None  RED FLAGS: None   WEIGHT BEARING RESTRICTIONS: No  FALLS:  Has patient fallen in last 6 months? Yes. Number of falls 1  Slipped on wet leaves fell on tailbone.   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   PLOF: Independent  PATIENT GOALS:  To have less pain   NEXT MD VISIT:  Sees the MD next week   OBJECTIVE:  Note: Objective measures were completed at Evaluation unless otherwise noted.  DIAGNOSTIC FINDINGS:  Bilateral hips:  An AP pelvis lateral left hip shows well located hips. There is no  significant arthritis hip  joint at all. There is no cortical  irregularities around either trochanteric area.   PATIENT SURVEYS:  LEFS  Extreme difficulty/unable (0), Quite a bit of difficulty (1), Moderate difficulty (2), Little difficulty (3), No difficulty (4) Survey date:    Any of your usual work, housework or school activities   2. Usual hobbies, recreational or sporting activities   3. Getting into/out of the bath   4. Walking between rooms   5. Putting on socks/shoes   6. Squatting    7. Lifting an object, like a bag of groceries from the floor   8. Performing light activities around your home   9. Performing heavy activities around  your home   10. Getting into/out of a car   11. Walking 2 blocks   12. Walking 1 mile   13. Going up/down 10 stairs (1 flight)   14. Standing for 1 hour   15.  sitting for 1 hour   16. Running on even ground   17. Running on uneven ground   18. Making sharp turns while running fast   19. Hopping    20. Rolling over in bed   Score total:  45     COGNITION: Overall cognitive status: Within functional limits for tasks assessed     SENSATION: Numbness in the left foot. Comes and goes    POSTURE: No Significant postural limitations  PALPATION: Spasming in bilateral lumbar paraspinals/ lateral hip area and into bilateral IT bands   LOWER EXTREMITY ROM:  Passive ROM Right eval Left eval  Hip flexion 125 125  Hip extension    Hip abduction    Hip adduction    Hip internal rotation WNL WNL  Hip external rotation WNL WNL  Knee flexion    Knee extension    Ankle dorsiflexion    Ankle plantarflexion    Ankle inversion    Ankle eversion     (Blank rows = not tested) hyper mobility in hips   LOWER EXTREMITY MMT:  MMT Right eval Left eval  Hip flexion 23.6 21.8  Hip extension    Hip abduction 18.6 26.1  Hip adduction    Hip internal rotation    Hip external rotation    Knee flexion    Knee extension 39.0 40.8  Ankle dorsiflexion    Ankle plantarflexion    Ankle inversion    Ankle eversion     (Blank rows = not tested)  LUMBAR ROM:   Active  A/PROM  eval  Flexion Pulling in right hnastring but full   Extension Nothing   Right lateral flexion   Left lateral flexion   Right rotation Sensation in mid back   Left rotation Pulling in left leg    (Blank rows = not tested)     FUNCTIONAL TESTS:  Single leg stance right 15 seconds Left can't maintain single leg stance   GAIT: Mild lateral movement bilateral  TREATMENT DATE:     11/11 Manual:  Soft tissue mobilization to lumbar spine and upper gluteal on the lbilateral  Left lateral IT band roller  STM to bilateral IT band   Neuro-re-ed:  Bridge 3x12 red   Standing slow march 3x12  Supine march with education n progression  Standing hip abduction 2x10 each leg  Cuing with standing exercises to use UE to keep hip stable  Last: Manual:  Soft tissue mobilization to lumbar spine and upper gluteal on the left  Right anterior hip roller  Left LAD Grade II and III with oscillations   Neuro re-ed:  Reviewed her current HEP  Supine march x10 ROe of 3 Supine march with switch 2x10 RPE of 5   Supine bridge 3x10 qith red band RPE of 5   Standing slow march. Difficulty with weight bearing on the left but easier on the right 2x12    Manual:  Soft tissue mobilization to lumbar spine and upper gluteal   Eval: Manual:  Soft tissue mobilization to lumbar spine and upper gluteal   Neuro-re-ed  See HEP below   PATIENT EDUCATION:  Education details: HEP, symptom management Person educated: Patient Education method: Explanation, Demonstration, Tactile cues, Verbal cues, and Handouts Education comprehension: verbalized understanding, returned demonstration, verbal cues required, tactile cues required, and needs further education  HOME EXERCISE PROGRAM: Access Code: [email or web address] URL: [email or web address] Date: 12/07/2023 Prepared by: Alm Don  Exercises - Supine Piriformis Stretch with Foot on Ground  - 1 x daily - 7 x weekly - 3 sets - 10 reps - Standing Glute Med Mobilization with Small Ball on Wall  - 1 x daily - 7 x weekly - 3 sets - 10 reps - Supine Bridge  - 1 x daily - 7 x weekly - 3 sets - 10 reps - Supine March  - 1 x daily - 7 x weekly - 3 sets - 10 reps - Hooklying Clamshell with Resistance  - 1 x daily - 7 x weekly - 3 sets - 10 reps - Supine March  - 1 x daily - 7 x weekly - 3 sets - 10 reps  ASSESSMENT:  CLINICAL  IMPRESSION: The patient tolerated treatment well. Therapy had more difficulty identifying trigger points today. We expanded her exercise to program to standing exercises. She could feel the standing exercises more. She was advised to use her stretches and soft tissue mobilization   Eval:  Patient is a 78 year old female who presents with bilateral hip pain left greater than right.  She also has pain that radiates down the back of her leg and numbness in her foot.  Both these issues are worse in the morning.  She presents with bilateral gluteal weakness.  She has significant spasming in her gluteal and lower lumbar spine particularly on the left side.  She has increased pain with left lumbar rotation.  She has full hip mobility.  She is likely hypermobile in her hips.  She would benefit from a program that focuses on hip stability and decreasing spasming in her lumbar spine and upper gluteal area. OBJECTIVE IMPAIRMENTS: Abnormal gait, decreased activity tolerance, decreased endurance, decreased mobility, difficulty walking, decreased ROM, decreased strength, and pain.   ACTIVITY LIMITATIONS: carrying, standing, squatting, stairs, and locomotion level  PARTICIPATION LIMITATIONS: cleaning, laundry, shopping, and community activity  PERSONAL FACTORS: Age are also affecting patient's functional outcome. Time of onset.   REHAB POTENTIAL: Good  CLINICAL DECISION MAKING: Evolving/moderate complexity increasing pain levels   EVALUATION COMPLEXITY:  Moderate   GOALS: Goals reviewed with patient? Yes  SHORT TERM GOALS: Target date: 01/04/2024   Patient will increase bilateral hip strength by 5 lbs  Baseline: Goal status: INITIAL  2.  Patient will report a 50% reduction in pain and numbness in her foot Baseline:  Goal status: INITIAL  3.  Patient will  Baseline:  Goal status: INITIAL   LONG TERM GOALS: Target date: 02/01/2024    Patient will go up.dwon 12 steps without pain  Baseline:   Goal status: INITIAL  2.  Patient will be independent with full HEP for strength and stability  Baseline:  Goal status: INITIAL  3.  Patent will ambulate community distances without pain  Baseline:  Goal status: INITIAL     PLAN:  PT FREQUENCY: 2x/week  PT DURATION: 8 weeks  PLANNED INTERVENTIONS: [id number]- PT Re-evaluation, 97110-Therapeutic exercises, 97530- Therapeutic activity, 97112- Neuromuscular re-education, 97535- Self Care, [id number]- Manual therapy, [id number]- Gait training, [phone]- Aquatic Therapy, [phone]- Electrical stimulation (unattended), Patient/Family education, Joint manipulation, Spinal mobilization, Cryotherapy, and Moist heat  PLAN FOR NEXT SESSION:  Begin with manual therapy to bilateral hips and lumbar spine.  Seems to be more of a stability issue.  Reviewed current exercises.  Progressed to standing exercises tolerated.  Consider gym exercises.  Discussed how to use TA breathing with gym exercises.   Rojean JONELLE Batten, PT 01/04/2024, 10:37 AM

## 2024-01-06 ENCOUNTER — Ambulatory Visit (HOSPITAL_BASED_OUTPATIENT_CLINIC_OR_DEPARTMENT_OTHER): Payer: Self-pay | Admitting: Physical Therapy

## 2024-01-19 ENCOUNTER — Ambulatory Visit: Admitting: Orthopaedic Surgery

## 2024-01-19 ENCOUNTER — Encounter: Payer: Self-pay | Admitting: Orthopaedic Surgery

## 2024-01-19 DIAGNOSIS — M7062 Trochanteric bursitis, left hip: Secondary | ICD-10-CM

## 2024-01-19 DIAGNOSIS — M25552 Pain in left hip: Secondary | ICD-10-CM

## 2024-01-19 DIAGNOSIS — M25551 Pain in right hip: Secondary | ICD-10-CM | POA: Diagnosis not present

## 2024-01-19 DIAGNOSIS — M7061 Trochanteric bursitis, right hip: Secondary | ICD-10-CM | POA: Diagnosis not present

## 2024-01-19 NOTE — Progress Notes (Signed)
 The patient is a very active 78 year old female who is coming in for follow-up after we MRI both of her hips.  She has been through physical therapy with both hips at [facility] and has had multiple injections.  She is work on activity modification.  She does play a lot of pickleball.  We eventually sent her for MRI of both her hips given the continued pain she was experiencing.  She denies any groin pain still and more of her pain is on the lateral aspect of both hips with the right worse than the left.  The left is more just to palpation but the right is more with activities.  On my exam today again she walks with a normal-appearing gait and both hips move smoothly and fluidly with significant pain of the trochanteric area of both hips.  The MRI of both hips are reviewed and she does have significant tendinosis of the gluteus medius and minimus tendon of both hips worse on the right than the left and there is partial tearing as well as edema tracking along the tendon.  Again this seems more significant in the right than left but there is definitely tendinosis of both the gluteus minimus and medius tendons and again partial tearing worse on the right than the left.  I would like to send her to my partner Dr.Bokshan for further evaluation and treatment of this issue on this young athletic 78 year old female.  She agrees with this referral as well.

## 2024-01-27 ENCOUNTER — Ambulatory Visit (HOSPITAL_BASED_OUTPATIENT_CLINIC_OR_DEPARTMENT_OTHER): Admitting: Orthopaedic Surgery

## 2024-01-27 DIAGNOSIS — M7061 Trochanteric bursitis, right hip: Secondary | ICD-10-CM

## 2024-01-27 DIAGNOSIS — M7062 Trochanteric bursitis, left hip: Secondary | ICD-10-CM | POA: Diagnosis not present

## 2024-01-27 NOTE — Progress Notes (Signed)
 Chief Complaint: Bilateral hip pain     History of Present Illness:    Audrey Tucker is a 78 y.o. female presents with ongoing bilateral hip pain.  She is here today as a referral for Dr. Vernetta.  She has been dealing with hip pain for multiple years now right worse than left.  The right did start several years before the left.  She has been treated for trochanteric bursitis with multiple injections on both sides.  These did give her some very good initial relief but unfortunately have subsequently not been giving longer-term relief.  As result she is here today for discussion.  She does hope to get back to hiking at some point.  She has been working with physical therapy but has stagnated with this    PMH/PSH/Family History/Social History/Meds/Allergies:    Past Medical History:  Diagnosis Date   Adenomatous colon polyp    tubular   Arthritis    Chicken pox    Diverticulosis    Hernia, femoral    pt stated surgery in august of 2022, on left side    PONV (postoperative nausea and vomiting)    Seasonal allergies    Past Surgical History:  Procedure Laterality Date   COSMETIC SURGERY     EYE SURGERY Bilateral    cataract   FOOT NEUROMA SURGERY Right 04/29/2015   Dr Magdalen   HERNIA REPAIR     INGUINAL HERNIA REPAIR Bilateral 09/25/2020   Procedure: LAPAROSCOPIC Bilateral INGUINAL HERNIA REPAIR WITH MESH;  Surgeon: Kinsinger, Herlene Righter, MD;  Location: [facility];  Service: General;  Laterality: Bilateral;   right hand surgery      SHOULDER ARTHROSCOPY Left    Social History   Socioeconomic History   Marital status: Married    Spouse name: Not on file   Number of children: 0   Years of education: 16   Highest education level: Associate degree: academic program  Occupational History   Occupation: works occasionally     Comment: retired   Tobacco Use   Smoking status: Former    Current packs/day: 0.00    Average packs/day: 0.5 packs/day for 2.0 years (1.0 ttl  pk-yrs)    Types: Cigarettes    Quit date: 04/05/1967    Years since quitting: 56.8   Smokeless tobacco: Never   Tobacco comments:    rare use in college   Vaping Use   Vaping status: Never Used  Substance and Sexual Activity   Alcohol use: Yes    Alcohol/week: 3.0 standard drinks of alcohol    Types: 3 Glasses of wine per week    Comment: social   Drug use: No   Sexual activity: Not Currently  Other Topics Concern   Not on file  Social History Narrative   Married   No children   HH 2   Yard work, programmer, applications   Works occasionally    Social Drivers of Health   Tobacco Use: Medium Risk (01/19/2024)   Patient History    Smoking Tobacco Use: Former    Smokeless Tobacco Use: Never    Passive Exposure: Not on Actuary Strain: Low Risk (12/27/2023)   Overall Financial Resource Strain (CARDIA)    Difficulty of Paying Living Expenses: Not hard at all  Food Insecurity: No Food Insecurity (12/27/2023)   Epic    Worried About Radiation Protection Practitioner of Food in the Last Year: Never true    Ran Out of Food in the  Last Year: Never true  Transportation Needs: No Transportation Needs (12/27/2023)   Epic    Lack of Transportation (Medical): No    Lack of Transportation (Non-Medical): No  Physical Activity: Sufficiently Active (12/27/2023)   Exercise Vital Sign    Days of Exercise per Week: 3 days    Minutes of Exercise per Session: 60 min  Stress: Stress Concern Present (12/27/2023)   [employer] of Occupational Health - Occupational Stress Questionnaire    Feeling of Stress: Very much  Social Connections: Socially Integrated (12/27/2023)   Social Connection and Isolation Panel    Frequency of Communication with Friends and Family: More than three times a week    Frequency of Social Gatherings with Friends and Family: Twice a week    Attends Religious Services: More than 4 times per year    Active Member of Clubs or Organizations: Yes    Attends Banker  Meetings: More than 4 times per year    Marital Status: Married  Depression (PHQ2-9): Low Risk (12/27/2023)   Depression (PHQ2-9)    PHQ-2 Score: 0  Alcohol Screen: Low Risk (12/27/2023)   Alcohol Screen    Last Alcohol Screening Score (AUDIT): 2  Housing: Low Risk (12/27/2023)   Epic    Unable to Pay for Housing in the Last Year: No    Number of Times Moved in the Last Year: 0    Homeless in the Last Year: No  Utilities: Not At Risk (08/08/2022)   AHC Utilities    Threatened with loss of utilities: No  Health Literacy: Not on file   Family History  Problem Relation Age of Onset   Arthritis Mother    Hypertension Mother    Stroke Mother    Arthritis Father    Cancer Father        lung   Allergies[1] Current Outpatient Medications  Medication Sig Dispense Refill   hydrOXYzine  (ATARAX ) 25 MG tablet Take 0.5-1 tablets (12.5-25 mg total) by mouth 2 (two) times daily as needed for anxiety (anxiety). 30 tablet 1   meloxicam (MOBIC) 7.5 MG tablet Take 1 tablet (7.5 mg total) by mouth daily.     PARoxetine  (PAXIL ) 20 MG tablet Take 1 tablet (20 mg total) by mouth daily. 30 tablet 2   No current facility-administered medications for this visit.   No results found.  Review of Systems:   A ROS was performed including pertinent positives and negatives as documented in the HPI.  Physical Exam :   Constitutional: NAD and appears stated age Neurological: Alert and oriented Psych: Appropriate affect and cooperative There were no vitals taken for this visit.   Comprehensive Musculoskeletal Exam:    Right hip is tenderness over the greater trochanter as well as the left hip.  There is weakness with resisted abduction bilaterally although this is mild.  Otherwise nonantalgic gait and no pain with 30 degrees internal/external rotation of both hips   Imaging:   Xray (views right hip, 3 views left hip): Normal  MRI (right hip, left hip): Partial tearing of gluteus medius minimus  junction bilaterally   I personally reviewed and interpreted the radiographs.   Assessment and Plan:   78 y.o. female with evidence of partial tearing of the gluteus medius and minimus junction bilaterally consistent with early stage tear.  At today's visit I did discuss treatment options.  She is hopeful to avoid surgery.  Given that I will plan to refer her for aquatic therapy specifically as I  do believe she would benefit from this and I will plan to see her back in 2 months to assess her progress   I personally saw and evaluated the patient, and participated in the management and treatment plan.  Elspeth Parker, MD Attending Physician, Orthopedic Surgery  This document was dictated using Dragon voice recognition software. A reasonable attempt at proof reading has been made to minimize errors.    [1] No Known Allergies

## 2024-02-14 ENCOUNTER — Ambulatory Visit (INDEPENDENT_AMBULATORY_CARE_PROVIDER_SITE_OTHER): Admitting: Family Medicine

## 2024-02-14 ENCOUNTER — Encounter: Payer: Self-pay | Admitting: Family Medicine

## 2024-02-14 VITALS — BP 102/50 | HR 74 | Temp 98.3°F | Resp 12 | Ht 64.0 in | Wt 117.2 lb

## 2024-02-14 DIAGNOSIS — L659 Nonscarring hair loss, unspecified: Secondary | ICD-10-CM

## 2024-02-14 LAB — HEPATIC FUNCTION PANEL
ALT: 15 U/L (ref 3–35)
AST: 27 U/L (ref 5–37)
Albumin: 4.6 g/dL (ref 3.5–5.2)
Alkaline Phosphatase: 55 U/L (ref 39–117)
Bilirubin, Direct: 0.1 mg/dL (ref 0.1–0.3)
Total Bilirubin: 0.6 mg/dL (ref 0.2–1.2)
Total Protein: 6.8 g/dL (ref 6.0–8.3)

## 2024-02-14 LAB — CBC WITH DIFFERENTIAL/PLATELET
Basophils Absolute: 0 K/uL (ref 0.0–0.1)
Basophils Relative: 0.6 % (ref 0.0–3.0)
Eosinophils Absolute: 0.1 K/uL (ref 0.0–0.7)
Eosinophils Relative: 1.2 % (ref 0.0–5.0)
HCT: 38.2 % (ref 36.0–46.0)
Hemoglobin: 12.7 g/dL (ref 12.0–15.0)
Lymphocytes Relative: 16.5 % (ref 12.0–46.0)
Lymphs Abs: 1.1 K/uL (ref 0.7–4.0)
MCHC: 33.3 g/dL (ref 30.0–36.0)
MCV: 97.1 fl (ref 78.0–100.0)
Monocytes Absolute: 0.4 K/uL (ref 0.1–1.0)
Monocytes Relative: 6.6 % (ref 3.0–12.0)
Neutro Abs: 4.8 K/uL (ref 1.4–7.7)
Neutrophils Relative %: 75.1 % (ref 43.0–77.0)
Platelets: 249 K/uL (ref 150.0–400.0)
RBC: 3.93 Mil/uL (ref 3.87–5.11)
RDW: 13.5 % (ref 11.5–15.5)
WBC: 6.5 K/uL (ref 4.0–10.5)

## 2024-02-14 LAB — BASIC METABOLIC PANEL WITH GFR
BUN: 23 mg/dL (ref 6–23)
CO2: 30 meq/L (ref 19–32)
Calcium: 9.7 mg/dL (ref 8.4–10.5)
Chloride: 104 meq/L (ref 96–112)
Creatinine, Ser: 0.55 mg/dL (ref 0.40–1.20)
GFR: 87.86 mL/min
Glucose, Bld: 78 mg/dL (ref 70–99)
Potassium: 4.4 meq/L (ref 3.5–5.1)
Sodium: 140 meq/L (ref 135–145)

## 2024-02-14 LAB — TSH: TSH: 2.72 u[IU]/mL (ref 0.35–5.50)

## 2024-02-14 NOTE — Progress Notes (Signed)
" ° °  Subjective:    Patient ID: Audrey Tucker, female    DOB: [birth year], 78 y.o.   MRN: [id number]  HPI Hair loss- pt reports she used to have really thick hair but in the last few weeks it has been falling out.  In the midst of moving and under increased stress.  Pt notes hair loss while showering, combing hair, and throughout the day.  No hx of similar.  Some familial hair loss.  Pt has lost ~15 lbs in short order.  Not currently on a multivitamin.   Review of Systems For ROS see HPI     Objective:   Physical Exam Vitals reviewed.  Constitutional:      General: She is not in acute distress.    Appearance: Normal appearance. She is well-developed. She is not ill-appearing.  HENT:     Head: Normocephalic and atraumatic.  Eyes:     Conjunctiva/sclera: Conjunctivae normal.     Pupils: Pupils are equal, round, and reactive to light.  Neck:     Thyroid : No thyromegaly.  Cardiovascular:     Rate and Rhythm: Normal rate and regular rhythm.     Pulses: Normal pulses.     Heart sounds: Normal heart sounds. No murmur heard. Pulmonary:     Effort: Pulmonary effort is normal. No respiratory distress.     Breath sounds: Normal breath sounds.  Abdominal:     General: There is no distension.     Palpations: Abdomen is soft.     Tenderness: There is no abdominal tenderness.  Musculoskeletal:     Cervical back: Normal range of motion and neck supple.     Right lower leg: No edema.     Left lower leg: No edema.  Lymphadenopathy:     Cervical: No cervical adenopathy.  Skin:    General: Skin is warm and dry.  Neurological:     General: No focal deficit present.     Mental Status: She is alert and oriented to person, place, and time.  Psychiatric:        Mood and Affect: Mood normal.        Behavior: Behavior normal.        Thought Content: Thought content normal.           Assessment & Plan:  Hair loss- new.  Suspect this is stress related due to her ongoing move.  She admits  high stress levels.  But given that this is new for pt, will check labs to r/o underlying causes of hair loss like iron deficiency, thyroid  abnormality, or electrolyte disturbance.  Will address any underlying abnormalities if present  "

## 2024-02-14 NOTE — Patient Instructions (Signed)
 Follow up as needed or as scheduled We'll notify you of your lab results and make any changes if needed ADD a multivitamin daily to help w/ vitamin and nutrient deficiencies Call with any questions or concerns Stay Safe!  Stay Healthy! Happy New Year!

## 2024-02-15 ENCOUNTER — Ambulatory Visit: Payer: Self-pay | Admitting: Family Medicine

## 2024-02-15 LAB — IRON,TIBC AND FERRITIN PANEL
%SAT: 38 % (ref 16–45)
Ferritin: 131 ng/mL (ref 16–288)
Iron: 108 ug/dL (ref 45–160)
TIBC: 284 ug/dL (ref 250–450)

## 2024-03-18 ENCOUNTER — Other Ambulatory Visit: Payer: Self-pay | Admitting: Family Medicine

## 2024-03-18 DIAGNOSIS — F419 Anxiety disorder, unspecified: Secondary | ICD-10-CM

## 2024-03-20 ENCOUNTER — Encounter (HOSPITAL_BASED_OUTPATIENT_CLINIC_OR_DEPARTMENT_OTHER): Payer: Self-pay | Admitting: Physical Therapy

## 2024-03-21 ENCOUNTER — Encounter (HOSPITAL_BASED_OUTPATIENT_CLINIC_OR_DEPARTMENT_OTHER): Payer: Self-pay | Admitting: Physical Therapy

## 2024-03-21 ENCOUNTER — Ambulatory Visit (HOSPITAL_BASED_OUTPATIENT_CLINIC_OR_DEPARTMENT_OTHER): Attending: Orthopaedic Surgery | Admitting: Physical Therapy

## 2024-03-21 ENCOUNTER — Other Ambulatory Visit: Payer: Self-pay

## 2024-03-21 DIAGNOSIS — M6281 Muscle weakness (generalized): Secondary | ICD-10-CM

## 2024-03-21 DIAGNOSIS — M25551 Pain in right hip: Secondary | ICD-10-CM

## 2024-03-21 NOTE — Therapy (Signed)
 " OUTPATIENT PHYSICAL THERAPY LOWER EXTREMITY EVALUATION   Patient Name: Audrey Tucker MRN: [id number] DOB:[birth year], 79 y.o., female Today's Date: 03/21/2024  END OF SESSION:  PT End of Session - 03/21/24 1000     Visit Number 1    Date for Recertification  05/04/24    Authorization Type health team adv    Progress Note Due on Visit 10    PT Start Time 0805    PT Stop Time 0845    PT Time Calculation (min) 40 min    Activity Tolerance Patient tolerated treatment well    Behavior During Therapy [facility] for tasks assessed/performed          Past Medical History:  Diagnosis Date   Adenomatous colon polyp    tubular   Arthritis    Chicken pox    Diverticulosis    Hernia, femoral    pt stated surgery in august of 2022, on left side    PONV (postoperative nausea and vomiting)    Seasonal allergies    Past Surgical History:  Procedure Laterality Date   COSMETIC SURGERY     EYE SURGERY Bilateral    cataract   FOOT NEUROMA SURGERY Right 04/29/2015   Dr Magdalen   HERNIA REPAIR     INGUINAL HERNIA REPAIR Bilateral 09/25/2020   Procedure: LAPAROSCOPIC Bilateral INGUINAL HERNIA REPAIR WITH MESH;  Surgeon: Kinsinger, Herlene Righter, MD;  Location: [facility];  Service: General;  Laterality: Bilateral;   right hand surgery      SHOULDER ARTHROSCOPY Left    Patient Active Problem List   Diagnosis Date Noted   Hyperlipidemia 08/01/2023   Chronic neck pain 03/23/2023   Hot flash not due to menopause 03/23/2023   Apical lung scarring 06/16/2022   Abdominal aortic atherosclerosis 05/16/2020   Insomnia 05/16/2020   Seasonal allergies 05/16/2020   RLS (restless legs syndrome) 12/31/2016   Trochanteric bursitis of both hips 08/09/2016   Generalized osteoarthritis of hand 07/27/2016   Headache, variant migraine 07/26/2016   Laceration of flexor muscle, fascia and tendon of left index finger at wrist and hand level, initial encounter 05/16/2015   Left shoulder pain 09/20/2011    PCP:  Betty Jordan MD  REFERRING PROVIDER: Elspeth Parker MD  REFERRING DIAG:  M70.61 (ICD-10-CM) - Trochanteric bursitis, right hip  M70.62 (ICD-10-CM) - Trochanteric bursitis, left hip    THERAPY DIAG:  Pain of both hip joints  Muscle weakness (generalized)  Rationale for Evaluation and Treatment: Rehabilitation  ONSET DATE: several years  SUBJECTIVE:   SUBJECTIVE STATEMENT: Bilateral hip bursitis.  Has had several injections with good response initially but has not recently. Go to [employer] work out 2 x week.Very active not real limited just frustrated with pain. Some numbness around right ant tib   PERTINENT HISTORY: Right sided neuroma removal, left shoulder scope  PAIN:  Are you having pain? Yes: NPRS scale: current 1/10; worst 7/10 Pain location: Hips R>L Pain description: ache, stiff Aggravating factors: side lying, walking, pressure left hip Relieving factors: lying on back, injections  PRECAUTIONS: None   WEIGHT BEARING RESTRICTIONS: No  FALLS:  Has patient fallen in last 6 months? Yes. Number of falls slipped while hiking  LIVING ENVIRONMENT: Lives with: lives with their spouse Lives in: House/apartment Stairs: No Has following equipment at home: None  OCCUPATION: retired, walking  PLOF: Independent  PATIENT GOALS: return to hiking, reduce pain in hips  NEXT MD VISIT: 2 months  OBJECTIVE:  Note: Objective measures were completed  at Evaluation unless otherwise noted.  DIAGNOSTIC FINDINGS: MRI (right hip, left hip): Partial tearing of gluteus medius minimus junction bilaterally  PATIENT SURVEYS:  LEFS:72/80  COGNITION: Overall cognitive status: Within functional limits for tasks assessed      POSTURE: No Significant postural limitations  PALPATION: TTP over bilateral trochanters  LOWER EXTREMITY ROM:  Active ROM Right eval Left eval  Hip flexion wfl wfl  Hip extension    Hip abduction    Hip adduction    Hip internal rotation     Hip external rotation 20d 40d  Knee flexion    Knee extension    Ankle dorsiflexion    Ankle plantarflexion    Ankle inversion    Ankle eversion     (Blank rows = not tested)  LOWER EXTREMITY MMT:  MMT Right eval Left eval  Hip flexion 41.4 38.0  Hip extension 4/5 4/5  Hip abduction 19.3 17.3  Hip adduction    Hip internal rotation    Hip external rotation    Knee flexion    Knee extension    Ankle dorsiflexion    Ankle plantarflexion    Ankle inversion    Ankle eversion     (Blank rows = not tested)  FUNCTIONAL TESTS:  5 times sit to stand: 12.29   GAIT: wfl                                                                                                                                TREATMENT  Eval Self care:Posture and optometrist instruction    PATIENT EDUCATION:  Education details: Discussed eval findings, rehab rationale, aquatic program progression/POC and pools in area. Patient is in agreement  Person educated: Patient Education method: Explanation Education comprehension: verbalized understanding  HOME EXERCISE PROGRAM: Prior episode:Access Code: [email or web address]   ASSESSMENT:  CLINICAL IMPRESSION: Patient is a 79 y.o. f who was seen today for physical therapy evaluation and treatment for bilat hip pain.  Patient presents with pain limited deficits in  Bilateral hips consistent with dx of partial tears of glute med and minimus affecting strength, ROM, endurance, and activity tolerance. She is a very active senior exercising at [employer], has been painting walls at new home and is planning on a ski trip in 3 weeks. She complains of pain which is limiting her ability to hike, sit comfortably and sleep on side.   Patient will benefit from a short episode of aquatic therapy in order to improve function and reduce pain/impairment.      OBJECTIVE IMPAIRMENTS: Abnormal gait, decreased activity tolerance, decreased endurance, decreased mobility, difficulty  walking, decreased ROM, decreased strength, and pain.    ACTIVITY LIMITATIONS: carrying, standing, squatting, stairs, and locomotion level   PARTICIPATION LIMITATIONS: cleaning, laundry, shopping, and community activity   PERSONAL FACTORS: Age are also affecting patient's functional outcome. Time of onset.   CLINICAL DECISION MAKING: Stable/uncomplicated  EVALUATION COMPLEXITY: Low   GOALS: Goals  reviewed with patient? Yes  SHORT TERM GOALS: Target date: 04/11/24 Pt will tolerate full aquatic sessions consistently without increase in pain and with improving function to demonstrate good toleration and effectiveness of intervention.  Baseline: Goal status: INITIAL     LONG TERM GOALS: Target date: 05/04/24  Pt will improve strength in left hip flex by 5 lbs to demonstrate improved overall physical function Baseline:  Goal status: INITIAL  2.  Pt will tolerate skiing without limitation to pain Baseline:  Goal status: INITIAL  3.  Pt will be indep with final aquatic HEP for continued management of condition Baseline:  Goal status: INITIAL  4.  Pt will report decrease in pain by at least 50% for improved toleration to activity/quality of life and to demonstrate improved management of pain. Baseline:  Goal status: INITIAL     PLAN:  PT FREQUENCY: 2x/week  PT DURATION: 6 weeks  PLANNED INTERVENTIONS: 02835- PT Re-evaluation, 97750- Physical Performance Testing, 97110-Therapeutic exercises, 97530- Therapeutic activity, [id number]- Neuromuscular re-education, 97535- Self Care, 02859- Manual therapy, [phone]- Gait training, [phone]- Aquatic Therapy, [phone]- Electrical stimulation (unattended), [phone]- Electrical stimulation (manual), [id number]- Ionotophoresis 4mg /ml Dexamethasone , 20560 (1-2 muscles), 20561 (3+ muscles)- Dry Needling, Patient/Family education, Balance training, Stair training, Taping, Joint mobilization, DME instructions, Cryotherapy, and Biofeedback  PLAN FOR NEXT SESSION:  focus on hip /glute strength, pain management aquatic only may consider land intervention going forward if approp.   Ronal Silas) Nasier Thumm MPT 03/21/24 10:01 AM [facility] [address] [place], [place], [address] Phone: [phone]   Fax:  [phone]   "
